# Patient Record
Sex: Female | Born: 1980 | Race: Black or African American | Hispanic: No | Marital: Single | Smoking: Current every day smoker
Health system: Southern US, Community
[De-identification: ages and names within clinical notes are randomized; demographics above are authoritative.]

## PROBLEM LIST (undated history)

## (undated) ENCOUNTER — Inpatient Hospital Stay (HOSPITAL_COMMUNITY): Payer: Self-pay

## (undated) DIAGNOSIS — Z973 Presence of spectacles and contact lenses: Secondary | ICD-10-CM

## (undated) DIAGNOSIS — Z8742 Personal history of other diseases of the female genital tract: Secondary | ICD-10-CM

## (undated) DIAGNOSIS — Z8619 Personal history of other infectious and parasitic diseases: Secondary | ICD-10-CM

## (undated) DIAGNOSIS — N938 Other specified abnormal uterine and vaginal bleeding: Secondary | ICD-10-CM

## (undated) DIAGNOSIS — E0500 Thyrotoxicosis with diffuse goiter without thyrotoxic crisis or storm: Secondary | ICD-10-CM

## (undated) DIAGNOSIS — D259 Leiomyoma of uterus, unspecified: Secondary | ICD-10-CM

## (undated) HISTORY — PX: TONSILLECTOMY AND ADENOIDECTOMY: SUR1326

---
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
## 2001-01-01 ENCOUNTER — Encounter: Payer: Self-pay | Admitting: Family Medicine

## 2001-01-01 ENCOUNTER — Encounter: Admission: RE | Admit: 2001-01-01 | Discharge: 2001-01-01 | Payer: Self-pay | Admitting: Family Medicine

## 2001-09-30 ENCOUNTER — Other Ambulatory Visit: Admission: RE | Admit: 2001-09-30 | Discharge: 2001-09-30 | Payer: Self-pay | Admitting: Family Medicine

## 2004-03-10 HISTORY — PX: WISDOM TOOTH EXTRACTION: SHX21

## 2006-06-05 ENCOUNTER — Ambulatory Visit: Payer: Self-pay | Admitting: Family Medicine

## 2006-06-17 ENCOUNTER — Ambulatory Visit: Payer: Self-pay | Admitting: Family Medicine

## 2006-07-01 ENCOUNTER — Ambulatory Visit: Payer: Self-pay | Admitting: Family Medicine

## 2006-09-01 ENCOUNTER — Ambulatory Visit: Payer: Self-pay | Admitting: Family Medicine

## 2006-09-03 ENCOUNTER — Ambulatory Visit: Payer: Self-pay | Admitting: *Deleted

## 2006-11-10 ENCOUNTER — Ambulatory Visit: Payer: Self-pay | Admitting: Family Medicine

## 2006-11-12 ENCOUNTER — Ambulatory Visit: Payer: Self-pay | Admitting: Internal Medicine

## 2006-12-10 ENCOUNTER — Encounter (INDEPENDENT_AMBULATORY_CARE_PROVIDER_SITE_OTHER): Payer: Self-pay | Admitting: Family Medicine

## 2006-12-10 ENCOUNTER — Ambulatory Visit: Payer: Self-pay | Admitting: Internal Medicine

## 2007-04-08 ENCOUNTER — Ambulatory Visit: Payer: Self-pay | Admitting: Family Medicine

## 2007-04-08 LAB — CONVERTED CEMR LAB
ALT: <8 units/L (ref 0–35)
AST: 11 units/L (ref 0–37)
Albumin: 3.8 g/dL (ref 3.5–5.2)
Alkaline Phosphatase: 68 units/L (ref 39–117)
BUN: 14 mg/dL (ref 6–23)
Basophils Absolute: 0 10*3/uL (ref 0.0–0.1)
Basophils Relative: 0 % (ref 0–1)
CO2: 21 meq/L (ref 19–32)
Calcium: 8.7 mg/dL (ref 8.4–10.5)
Chlamydia, DNA Probe: NEGATIVE
Chloride: 109 meq/L (ref 96–112)
Cholesterol: 134 mg/dL (ref 0–200)
Creatinine, Ser: 0.62 mg/dL (ref 0.40–1.20)
Eosinophils Absolute: 0.1 10*3/uL (ref 0.0–0.7)
Eosinophils Relative: 1 % (ref 0–5)
GC Probe Amp, Genital: NEGATIVE
Glucose, Bld: 94 mg/dL (ref 70–99)
HCT: 39.5 % (ref 36.0–46.0)
HDL: 56 mg/dL (ref >39)
Hemoglobin: 13 g/dL (ref 12.0–15.0)
LDL Cholesterol: 59 mg/dL (ref 0–99)
Lymphocytes Relative: 44 % (ref 12–46)
Lymphs Abs: 4.6 10*3/uL — ABNORMAL HIGH (ref 0.7–4.0)
MCHC: 32.9 g/dL (ref 30.0–36.0)
MCV: 94.7 fL (ref 78.0–100.0)
Monocytes Absolute: 1 10*3/uL (ref 0.1–1.0)
Monocytes Relative: 9 % (ref 3–12)
Neutro Abs: 4.8 10*3/uL (ref 1.7–7.7)
Neutrophils Relative %: 46 % (ref 43–77)
Platelets: 322 10*3/uL (ref 150–400)
Potassium: 3.8 meq/L (ref 3.5–5.3)
RBC: 4.17 M/uL (ref 3.87–5.11)
RDW: 13.5 % (ref 11.5–15.5)
Sodium: 140 meq/L (ref 135–145)
Total Bilirubin: 0.3 mg/dL (ref 0.3–1.2)
Total CHOL/HDL Ratio: 2.4
Total Protein: 6.9 g/dL (ref 6.0–8.3)
Triglycerides: 93 mg/dL (ref <150)
VLDL: 19 mg/dL (ref 0–40)
WBC: 10.5 10*3/uL (ref 4.0–10.5)

## 2007-10-22 ENCOUNTER — Ambulatory Visit: Payer: Self-pay | Admitting: Internal Medicine

## 2008-11-14 ENCOUNTER — Emergency Department (HOSPITAL_COMMUNITY): Admission: EM | Admit: 2008-11-14 | Discharge: 2008-11-14 | Payer: Self-pay | Admitting: Emergency Medicine

## 2009-03-10 HISTORY — PX: LEEP: SHX91

## 2010-03-31 ENCOUNTER — Encounter: Payer: Self-pay | Admitting: Family Medicine

## 2012-09-27 ENCOUNTER — Emergency Department (INDEPENDENT_AMBULATORY_CARE_PROVIDER_SITE_OTHER)
Admission: EM | Admit: 2012-09-27 | Discharge: 2012-09-27 | Disposition: A | Discharge status: Home / Self Care | Payer: Self-pay | Source: Home / Self Care

## 2012-09-27 ENCOUNTER — Encounter (HOSPITAL_COMMUNITY): Payer: Self-pay | Admitting: *Deleted

## 2012-09-27 DIAGNOSIS — J329 Chronic sinusitis, unspecified: Secondary | ICD-10-CM

## 2012-09-27 DIAGNOSIS — G44209 Tension-type headache, unspecified, not intractable: Secondary | ICD-10-CM

## 2012-09-27 MED ORDER — NAPROXEN 500 MG PO TBEC: 500.0000 mg | DELAYED_RELEASE_TABLET | Freq: Two times a day (BID) | ORAL | Status: DC

## 2012-09-27 MED ORDER — TRAMADOL HCL 50 MG PO TABS: 50.0000 mg | ORAL_TABLET | Freq: Four times a day (QID) | ORAL | Status: DC | PRN

## 2012-09-27 NOTE — ED Notes (Signed)
Pt   Has  Symptoms    Of  Headache        Sore  Neck  Lymph  Nodes   Body aches        As   Well  As   sorethroat     -  Symptoms        X    3  Weeks           Pt   Sitting  Upright       On  Exam  Table       In no  Severe  Distress     Speaking  In  Complete     sentances

## 2012-09-27 NOTE — ED Provider Notes (Signed)
History    CSN: [id number] Arrival date & time 09/27/12  1745  First MD Initiated Contact with Patient 09/27/12 1924     Chief Complaint  Patient presents with  . Headache   (Consider location/radiation/quality/duration/timing/severity/associated sxs/prior Treatment) HPI Comments: 32 year old female complaining of headache almost on a daily basis for a month. She states that she has soreness in the back of her head occasionally photophobia and lymph nodes in the back of her neck. She feels sore and achy. She told the nurse that she has a sore throat. She denies fever and current sore throat. She states her sore throat was several days ago but not since. Denies earache, vomiting, abdominal pain, pelvic pain or urinary or vaginal symptoms.  History reviewed. No pertinent past medical history. Past Surgical History  Procedure Laterality Date  . Tonsillectomy     No family history on file. History  Substance Use Topics  . Smoking status: Current Every Day Smoker  . Smokeless tobacco: Not on file  . Alcohol Use: Yes   OB History   Grav Para Term Preterm Abortions TAB SAB Ect Mult Living                 Review of Systems  Constitutional: Negative.   HENT: Positive for neck pain. Negative for ear pain, sore throat, rhinorrhea, neck stiffness and ear discharge.   Eyes: Negative.   Respiratory: Negative for cough, shortness of breath, wheezing and stridor.   Cardiovascular: Negative for chest pain.  Gastrointestinal: Negative.   Genitourinary: Negative.   Skin: Negative.   Neurological: Positive for headaches. Negative for tremors, seizures, syncope, speech difficulty and weakness.    Allergies  Review of patient's allergies indicates no known allergies.  Home Medications   Current Outpatient Rx  Name  Route  Sig  Dispense  Refill  . naproxen (EC-NAPROSYN) 500 MG EC tablet   Oral   Take 1 tablet (500 mg total) by mouth 2 (two) times daily with a meal.   20 tablet   0    . traMADol (ULTRAM) 50 MG tablet   Oral   Take 1 tablet (50 mg total) by mouth every 6 (six) hours as needed for pain.   15 tablet   0    BP 128/82  Pulse 82  Temp(Src) 99.6 F (37.6 C) (Oral)  Resp 18  SpO2 100%  LMP 09/22/2012 Physical Exam  Nursing note and vitals reviewed. Constitutional: She is oriented to person, place, and time. She appears well-developed and well-nourished. No distress.  The patient does not appear ill.  HENT:  Right Ear: External ear normal.  Left Ear: External ear normal.  Completely unable to visualize the oropharynx due to patient's voluntary retraction of the tongue. Unable to see anything and will not be able to access the throat with swabs. She makes no 10 to relax her tongue or follow directions and assisting in depressing the tongue.  Eyes: Conjunctivae and EOM are normal. Pupils are equal, round, and reactive to light.  Neck: Normal range of motion. Neck supple.  Palpation of the posterior, lateral and anterior neck reveals no lymph nodes of any size. When asked to demonstrate  Where the lymph nodes are the patient touches the posterior neck musculature. Am able to palpate the length of the musculature of the  posterior neck muscles that inserts onto the occiput. These are quite tender. When stretching she states that they feel better.   Cardiovascular: Normal rate, regular rhythm and normal  heart sounds.   Pulmonary/Chest: Effort normal and breath sounds normal. No respiratory distress. She has no wheezes. She has no rales.  Musculoskeletal: Normal range of motion. She exhibits no edema.  Lymphadenopathy:    She has no cervical adenopathy.  Neurological: She is alert and oriented to person, place, and time.  Skin: Skin is warm and dry. No rash noted.  Psychiatric: She has a normal mood and affect.    ED Course  Procedures (including critical care time) Labs Reviewed - No data to display No results found. 1. Tension-type headache   2.  Sinusitis nasal     MDM  Naprosyn EC 500 mg twice a day when necessary headache Tramadol 50 mg every 6 hours when necessary pain Recommend Sudafed PE 10 mg every 4 hours when necessary congestion Since her having headaches on a daily basis almost a month he need followup with your PCP soon as possible, he may need to see a neurologist. Am completely unable to visualize the oropharynx or obtain a swab due to the patient's voluntary retraction of the tongue which prevent visual or physical access. There are no palpable cervical lymph nodes.   Hayden Rasmussen, NP 09/27/12 1952

## 2012-09-27 NOTE — ED Provider Notes (Signed)
Medical screening examination/treatment/procedure(s) were performed by resident physician or non-physician practitioner and as supervising physician I was immediately available for consultation/collaboration.   KINDL,JAMES DOUGLAS MD.   James D Kindl, MD 09/27/12 2027 

## 2012-12-08 ENCOUNTER — Inpatient Hospital Stay (HOSPITAL_COMMUNITY)
Admission: AD | Admit: 2012-12-08 | Discharge: 2012-12-08 | Disposition: A | Discharge status: Home / Self Care | Payer: Self-pay | Source: Ambulatory Visit | Attending: Family Medicine | Admitting: Family Medicine

## 2012-12-08 ENCOUNTER — Encounter (HOSPITAL_COMMUNITY): Payer: Self-pay

## 2012-12-08 DIAGNOSIS — N938 Other specified abnormal uterine and vaginal bleeding: Secondary | ICD-10-CM | POA: Insufficient documentation

## 2012-12-08 DIAGNOSIS — N946 Dysmenorrhea, unspecified: Secondary | ICD-10-CM | POA: Insufficient documentation

## 2012-12-08 DIAGNOSIS — N949 Unspecified condition associated with female genital organs and menstrual cycle: Secondary | ICD-10-CM | POA: Insufficient documentation

## 2012-12-08 LAB — URINALYSIS, ROUTINE W REFLEX MICROSCOPIC
Glucose, UA: NEGATIVE mg/dL
Leukocytes, UA: NEGATIVE
Nitrite: NEGATIVE
Protein, ur: 100 mg/dL — AB
pH: 6.5 (ref 5.0–8.0)

## 2012-12-08 LAB — CBC
HCT: 41.4 % (ref 36.0–46.0)
MCH: 30 pg (ref 26.0–34.0)
MCHC: 33.3 g/dL (ref 30.0–36.0)
Platelets: 399 10*3/uL (ref 150–400)
RDW: 13.7 % (ref 11.5–15.5)

## 2012-12-08 LAB — URINE MICROSCOPIC-ADD ON

## 2012-12-08 LAB — POCT PREGNANCY, URINE: Preg Test, Ur: NEGATIVE

## 2012-12-08 MED ORDER — TRAMADOL HCL 50 MG PO TABS: 50.0000 mg | ORAL_TABLET | Freq: Four times a day (QID) | ORAL | Status: DC | PRN

## 2012-12-08 NOTE — MAU Note (Signed)
Pt reports she started new birth control pills 8 months ago, has been spotting for up to a week every month before her period. Having sharp cramping with periods. LMP 11/24/2012, has changed her pad x 2 today.

## 2012-12-08 NOTE — MAU Provider Note (Signed)
History     CSN: [id number]  Arrival date and time: 12/08/12 1850   First Provider Initiated Contact with Patient 12/08/12 2058      Chief Complaint  Patient presents with  . Vaginal Bleeding  . Dysmenorrhea   HPI This is a 32 y.o. female who presents with c/o bleeding between periods, requiring a pad. Had been on OrthoTricyclen since teen years for severe dysmenorrhea.  Was changed by [facility] 8 mos ago to Alesse pills (possibly due to migraines and smoking).  Began having BTB at that time. Was told by [facility] that this was normal with first 6 mos of pills. Has not been back to them to discuss this.   Is worried something "is very wrong" because of this bleeding and cramping.  Not sexually active now, declines STD testing.  May want to conceive in the next few years "before I am 36".    Has a family history of fibroids. Worried she has these.   OB History   Grav Para Term Preterm Abortions TAB SAB Ect Mult Living   0 0 0 0 0 0 0 0 0 0       Past Medical History  Diagnosis Date  . Diabetes mellitus without complication     borderline diabetic  . Abnormal Pap smear 2011  . HPV in female     Past Surgical History  Procedure Laterality Date  . Tonsillectomy    . Leep  2011    Family History  Problem Relation Age of Onset  . Fibroids Mother     History  Substance Use Topics  . Smoking status: Current Every Day Smoker -- 0.25 packs/day for 10 years    Types: Cigarettes  . Smokeless tobacco: Not on file  . Alcohol Use: Yes    Allergies: No Known Allergies  Prescriptions prior to admission  Medication Sig Dispense Refill  . aspirin-acetaminophen-caffeine (EXCEDRIN MIGRAINE) 250-250-65 MG per tablet Take 2 tablets by mouth every 6 (six) hours as needed for pain (For headache.).      Marland Kitchen cetirizine (ZYRTEC) 10 MG tablet Take 10 mg by mouth daily.      . fish oil-omega-3 fatty acids 1000 MG capsule Take 1 g by mouth daily.      Marland Kitchen levonorgestrel-ethinyl  estradiol (AVIANE,ALESSE,LESSINA) 0.1-20 MG-MCG tablet Take 1 tablet by mouth daily.      . Multiple Vitamin (MULTIVITAMIN WITH MINERALS) TABS tablet Take 1 tablet by mouth daily.      Marland Kitchen SLO-NIACIN PO Take 1 tablet by mouth 2 (two) times a week. Takes one capsule on Wednesday and Sunday.        Review of Systems  Constitutional: Negative for fever and malaise/fatigue.  Gastrointestinal: Positive for abdominal pain (with menses, refractory to Motrin 800mg ). Negative for nausea, vomiting, diarrhea and constipation.  Genitourinary:       Bleeding between periods   Neurological: Positive for weakness. Negative for headaches.   Physical Exam   Blood pressure 133/83, pulse 67, temperature 98.3 F (36.8 C), temperature source Oral, resp. rate 18, height 5\' 3"  (1.6 m), weight 98.431 kg (217 lb), last menstrual period 11/24/2012, SpO2 100.00%.  Physical Exam  Constitutional: She is oriented to person, place, and time. She appears well-developed and well-nourished. No distress.  HENT:  Head: Normocephalic.  Cardiovascular: Normal rate.   Respiratory: Effort normal.  GI: Soft. She exhibits no distension. There is no tenderness. There is no rebound and no guarding.  Genitourinary: Uterus normal. Vaginal discharge (dark  red blood) found.  Musculoskeletal: Normal range of motion.  Neurological: She is alert and oriented to person, place, and time.  Skin: Skin is warm and dry.  Psychiatric:  Very anxious   Uterus difficult to feel due to habitus, does not feel very large Adnexae nontender   MAU Course  Procedures  MDM Extensive discussion of BTB with very low dose pills Extensive counseling on smoking cessation May need to rule out endometriosis or fibroids Will check CBC  Assessment and Plan  A:  Dysfunctional uterine bleeding, most likely related to very low dose pills      Dysmenorrhea, severe        P:  Discussed diagnostic options      Does not have insurance, so would like  to do some testing outside ED      Will refer to clinic for management      Continue ibuprofen prn for pain, will give limited Rx Tramadol      Will schedule outpatient [place] to r/o fibroids      Will have MD assess likelihood of endometriosis as source of severe dysmenorrhea      [facility] 12/08/2012, 9:31 PM

## 2012-12-08 NOTE — MAU Provider Note (Signed)
Chart reviewed and agree with management and plan.  

## 2012-12-10 LAB — URINE CULTURE
Colony Count: NO GROWTH
Culture: NO GROWTH

## 2012-12-15 ENCOUNTER — Telehealth: Payer: Self-pay | Admitting: *Deleted

## 2012-12-15 DIAGNOSIS — D219 Benign neoplasm of connective and other soft tissue, unspecified: Secondary | ICD-10-CM

## 2012-12-15 DIAGNOSIS — N946 Dysmenorrhea, unspecified: Secondary | ICD-10-CM

## 2012-12-15 NOTE — Telephone Encounter (Signed)
Message copied by Gerome Apley on Wed Dec 15, 2012  3:15 PM ------      Message from: [place], [place] L      Created: Wed Dec 08, 2012  9:28 PM      Regarding: GYN referral       Seen for breakthrough bleeding on very low dose OCPs. Also has increased pain with periods (r/o fibroids or endometriosis)            Admin:   Needs GYN appt with MD to discuss possible endometriosis (whether they would recommend laparoscopy for diagnosis) and rsults of [place]            Clinical:      Needs Outpatient US pelvic complete (I will put in order for radiology)      Not sure if Radiology will order or is that just for OB ultrasounds             ------

## 2012-12-15 NOTE — Telephone Encounter (Signed)
Has gyn follow up scheduled, called and scheduled ultrasounds. Called patient and left message with a female to have patient call clinic re: appointments we have made.

## 2012-12-20 ENCOUNTER — Ambulatory Visit (HOSPITAL_COMMUNITY)
Admission: RE | Admit: 2012-12-20 | Discharge: 2012-12-20 | Disposition: A | Discharge status: Home / Self Care | Payer: Self-pay | Source: Ambulatory Visit | Attending: Advanced Practice Midwife | Admitting: Advanced Practice Midwife

## 2012-12-20 DIAGNOSIS — N946 Dysmenorrhea, unspecified: Secondary | ICD-10-CM | POA: Insufficient documentation

## 2012-12-20 DIAGNOSIS — D250 Submucous leiomyoma of uterus: Secondary | ICD-10-CM | POA: Insufficient documentation

## 2012-12-20 DIAGNOSIS — N938 Other specified abnormal uterine and vaginal bleeding: Secondary | ICD-10-CM

## 2012-12-20 DIAGNOSIS — D219 Benign neoplasm of connective and other soft tissue, unspecified: Secondary | ICD-10-CM

## 2012-12-22 NOTE — Telephone Encounter (Signed)
Called Krista Hogan and left message we are calling about some appointments we have made for  You- left information for gyn follow up appt.  Please call clinics to discuss what this appointment is for. Do see per chart she did come to ultrasound appointment.

## 2012-12-23 ENCOUNTER — Telehealth: Payer: Self-pay | Admitting: *Deleted

## 2012-12-23 NOTE — Telephone Encounter (Signed)
Krista Hogan called and left a message stating she wants results of ultrasound

## 2012-12-27 NOTE — Telephone Encounter (Signed)
Called patient and her mother answered left message that we are returning her call. Mother states that she will let her know to call us.

## 2012-12-31 NOTE — Telephone Encounter (Signed)
Called patient back; no answer. Left message on VM to call us back and for her to state in the nurse line that she's okay for [place] to leave result message on her vm box.

## 2013-01-03 NOTE — Telephone Encounter (Signed)
Patient left voice message on Friday stating that we can leave a voicemail that is detailed.

## 2013-01-05 NOTE — Telephone Encounter (Signed)
Called pt and informed her of [place] result showing small fibroid. I advised her to keep appt as scheduled on 11/13 to discuss her sx and develop plan of care with MD. Pt agreed and voiced understanding

## 2013-01-20 ENCOUNTER — Ambulatory Visit (INDEPENDENT_AMBULATORY_CARE_PROVIDER_SITE_OTHER): Payer: Self-pay | Admitting: Obstetrics & Gynecology

## 2013-01-20 ENCOUNTER — Encounter: Payer: Self-pay | Admitting: Obstetrics & Gynecology

## 2013-01-20 VITALS — BP 112/78 | HR 85 | Temp 98.1°F | Ht 63.0 in | Wt 215.2 lb

## 2013-01-20 DIAGNOSIS — N946 Dysmenorrhea, unspecified: Secondary | ICD-10-CM

## 2013-01-20 DIAGNOSIS — N926 Irregular menstruation, unspecified: Secondary | ICD-10-CM

## 2013-01-20 MED ORDER — NORGESTIMATE-ETH ESTRADIOL 0.25-35 MG-MCG PO TABS: 1.0000 | ORAL_TABLET | Freq: Every day | ORAL | Status: DC

## 2013-01-20 NOTE — Progress Notes (Signed)
Subjective:     Patient ID: Krista Hogan, female   DOB: Jul 26, 1980, 32 y.o.   MRN: [id number]  HPI Pt was told that she had uterine fibroids and was worried that she would never be able to bear children.  She was without sx until she was started on low dose OCP's and she started to have some spotting.  She was annoyed by the bleeding and thought it was all related to the OCP's until she got the results of the sono.  She dnies pain or other sx. She wants to change the OCP.      Review of Systems     Objective:   Physical Exam BP 112/78  Pulse 85  Temp(Src) 98.1 F (36.7 C)  Ht 5\' 3"  (1.6 m)  Wt 215 lb 3.2 oz (97.614 kg)  BMI 38.13 kg/m2  LMP 01/12/2013 PT in NAD Exam deferred  12/20/2012 TECHNIQUE:  Both transabdominal and transvaginal ultrasound examinations of the  pelvis were performed. Transabdominal technique was performed for  global imaging of the pelvis including uterus, ovaries, adnexal  regions, and pelvic cul-de-sac. It was necessary to proceed with  endovaginal exam following the transabdominal exam to visualize the  uterus and ovaries.  COMPARISON: Pelvic ultrasound 03/23/2006.  FINDINGS:  Uterus  The uterus is anteverted. It measures 7.1 x 3.5 x 3.9 cm. There is a  2.2 x 2.0 x 2.0 cm submucosal fibroid within the uterine fundus.  Endometrium  Thickness: Unremarkable measuring 3 mm No focal abnormality  visualized.  Right ovary  Measurements: Measures 2.8 x 2.4 x 2.4 cm. Normal appearance/no  adnexal mass.  Left ovary  Measurements: Measures 2.1 x 2.1 x 1.5 cm. Normal appearance/no  adnexal mass.  Other findings  Trace free fluid.  IMPRESSION:  Submucosal fibroid within the uterine fundus.       Assessment:      Asymptomatic uterine fibroid Break through bleeding on low dose OCP's     Plan:     Change OCP's to Sprintec F/u prn Pt educated on Uterine fibroids

## 2013-01-20 NOTE — Progress Notes (Signed)
Pt does not want an exam today, desires to consult only.

## 2013-01-20 NOTE — Patient Instructions (Signed)
Levonorgestrel intrauterine device (IUD) What is this medicine? LEVONORGESTREL IUD (LEE voe nor jes trel) is a contraceptive (birth control) device. The device is placed inside the uterus by a healthcare professional. It is used to prevent pregnancy and can also be used to treat heavy bleeding that occurs during your period. Depending on the device, it can be used for 3 to 5 years. This medicine may be used for other purposes; ask your health care provider or pharmacist if you have questions. COMMON BRAND NAME(S): Mirena, Skyla What should I tell my health care provider before I take this medicine? They need to know if you have any of these conditions: -abnormal Pap smear -cancer of the breast, uterus, or cervix -diabetes -endometritis -genital or pelvic infection now or in the past -have more than one sexual partner or your partner has more than one partner -heart disease -history of an ectopic or tubal pregnancy -immune system problems -IUD in place -liver disease or tumor -problems with blood clots or take blood-thinners -use intravenous drugs -uterus of unusual shape -vaginal bleeding that has not been explained -an unusual or allergic reaction to levonorgestrel, other hormones, silicone, or polyethylene, medicines, foods, dyes, or preservatives -pregnant or trying to get pregnant -breast-feeding How should I use this medicine? This device is placed inside the uterus by a health care professional. Talk to your pediatrician regarding the use of this medicine in children. Special care may be needed. Overdosage: If you think you have taken too much of this medicine contact a poison control center or emergency room at once. NOTE: This medicine is only for you. Do not share this medicine with others. What if I miss a dose? This does not apply. What may interact with this medicine? Do not take this medicine with any of the following  medications: -amprenavir -bosentan -fosamprenavir This medicine may also interact with the following medications: -aprepitant -barbiturate medicines for inducing sleep or treating seizures -bexarotene -griseofulvin -medicines to treat seizures like carbamazepine, ethotoin, felbamate, oxcarbazepine, phenytoin, topiramate -modafinil -pioglitazone -rifabutin -rifampin -rifapentine -some medicines to treat HIV infection like atazanavir, indinavir, lopinavir, nelfinavir, tipranavir, ritonavir -St. John's wort -warfarin This list may not describe all possible interactions. Give your health care provider a list of all the medicines, herbs, non-prescription drugs, or dietary supplements you use. Also tell them if you smoke, drink alcohol, or use illegal drugs. Some items may interact with your medicine. What should I watch for while using this medicine? Visit your doctor or health care professional for regular check ups. See your doctor if you or your partner has sexual contact with others, becomes HIV positive, or gets a sexual transmitted disease. This product does not protect you against HIV infection (AIDS) or other sexually transmitted diseases. You can check the placement of the IUD yourself by reaching up to the top of your vagina with clean fingers to feel the threads. Do not pull on the threads. It is a good habit to check placement after each menstrual period. Call your doctor right away if you feel more of the IUD than just the threads or if you cannot feel the threads at all. The IUD may come out by itself. You may become pregnant if the device comes out. If you notice that the IUD has come out use a backup birth control method like condoms and call your health care provider. Using tampons will not change the position of the IUD and are okay to use during your period. What side effects may I   notice from receiving this medicine? Side effects that you should report to your doctor or  health care professional as soon as possible: -allergic reactions like skin rash, itching or hives, swelling of the face, lips, or tongue -fever, flu-like symptoms -genital sores -high blood pressure -no menstrual period for 6 weeks during use -pain, swelling, warmth in the leg -pelvic pain or tenderness -severe or sudden headache -signs of pregnancy -stomach cramping -sudden shortness of breath -trouble with balance, talking, or walking -unusual vaginal bleeding, discharge -yellowing of the eyes or skin Side effects that usually do not require medical attention (report to your doctor or health care professional if they continue or are bothersome): -acne -breast pain -change in sex drive or performance -changes in weight -cramping, dizziness, or faintness while the device is being inserted -headache -irregular menstrual bleeding within first 3 to 6 months of use -nausea This list may not describe all possible side effects. Call your doctor for medical advice about side effects. You may report side effects to FDA at 1-800-FDA-1088. Where should I keep my medicine? This does not apply. NOTE: This sheet is a summary. It may not cover all possible information. If you have questions about this medicine, talk to your doctor, pharmacist, or health care provider.  2014, [employer] Standard. (2011-03-27 13:54:04)  

## 2013-12-14 ENCOUNTER — Ambulatory Visit: Payer: Self-pay | Admitting: Obstetrics

## 2013-12-15 ENCOUNTER — Ambulatory Visit (INDEPENDENT_AMBULATORY_CARE_PROVIDER_SITE_OTHER): Payer: Managed Care, Other (non HMO) | Admitting: Obstetrics

## 2013-12-15 ENCOUNTER — Encounter: Payer: Self-pay | Admitting: Obstetrics

## 2013-12-15 ENCOUNTER — Ambulatory Visit: Payer: Self-pay | Admitting: Obstetrics

## 2013-12-15 VITALS — BP 109/75 | HR 76 | Temp 98.4°F | Ht 63.0 in | Wt 213.0 lb

## 2013-12-15 DIAGNOSIS — D250 Submucous leiomyoma of uterus: Secondary | ICD-10-CM

## 2013-12-15 DIAGNOSIS — R3915 Urgency of urination: Secondary | ICD-10-CM

## 2013-12-15 DIAGNOSIS — N926 Irregular menstruation, unspecified: Secondary | ICD-10-CM | POA: Insufficient documentation

## 2013-12-15 DIAGNOSIS — Z01419 Encounter for gynecological examination (general) (routine) without abnormal findings: Secondary | ICD-10-CM

## 2013-12-15 DIAGNOSIS — N939 Abnormal uterine and vaginal bleeding, unspecified: Secondary | ICD-10-CM | POA: Insufficient documentation

## 2013-12-15 DIAGNOSIS — N946 Dysmenorrhea, unspecified: Secondary | ICD-10-CM | POA: Insufficient documentation

## 2013-12-15 DIAGNOSIS — Z0000 Encounter for general adult medical examination without abnormal findings: Secondary | ICD-10-CM

## 2013-12-15 HISTORY — DX: Submucous leiomyoma of uterus: D25.0

## 2013-12-15 LAB — POCT URINALYSIS DIPSTICK
Bilirubin, UA: NEGATIVE
Glucose, UA: NEGATIVE
KETONES UA: NEGATIVE
Leukocytes, UA: NEGATIVE
Nitrite, UA: NEGATIVE
PH UA: 6
SPEC GRAV UA: 1.02
Urobilinogen, UA: NEGATIVE

## 2013-12-15 MED ORDER — TRAMADOL HCL 50 MG PO TABS: 50.0000 mg | ORAL_TABLET | Freq: Four times a day (QID) | ORAL | Status: DC | PRN

## 2013-12-15 MED ORDER — NORGESTIMATE-ETH ESTRADIOL 0.25-35 MG-MCG PO TABS: 1.0000 | ORAL_TABLET | Freq: Every day | ORAL | Status: DC

## 2013-12-15 MED ORDER — IBUPROFEN 800 MG PO TABS: 800.0000 mg | ORAL_TABLET | Freq: Three times a day (TID) | ORAL | Status: DC | PRN

## 2013-12-15 NOTE — Progress Notes (Signed)
Subjective:     Krista Hogan is a 33 y.o. female here for a routine exam.  Current complaints: Uterine fibroid with painful and heavy periods.  Urinary urgency.  Recurrent yeast infections that has responded well to Boric acid suppositories after failures with Diflucan and creams.  Personal health questionnaire:  Is patient Ashkenazi Jewish, have a family history of breast and/or ovarian cancer: no Is there a family history of uterine cancer diagnosed at age < 70, gastrointestinal cancer, urinary tract cancer, family member who is a Field seismologist syndrome-associated carrier: no Is the patient overweight and hypertensive, family history of diabetes, personal history of gestational diabetes or PCOS: no Is patient over 41, have PCOS,  family history of premature CHD under age 2, diabetes, smoke, have hypertension or peripheral artery disease:  no At any time, has a partner hit, kicked or otherwise hurt or frightened you?: no Over the past 2 weeks, have you felt down, depressed or hopeless?: no Over the past 2 weeks, have you felt little interest or pleasure in doing things?:no   Gynecologic History Patient's last menstrual period was 11/23/2013. Contraception: OCP (estrogen/progesterone) Last Pap: 2011. Results were: normal Last mammogram: n/a. Results were: normal  Obstetric History OB History  Gravida Para Term Preterm AB SAB TAB Ectopic Multiple Living  0 0 0 0 0 0 0 0 0 0         Past Medical History  Diagnosis Date  . Diabetes mellitus without complication     borderline diabetic  . Abnormal Pap smear 2011  . HPV in female     Past Surgical History  Procedure Laterality Date  . Tonsillectomy    . Leep  2011    Current outpatient prescriptions:aspirin-acetaminophen-caffeine (EXCEDRIN MIGRAINE) 967-893-81 MG per tablet, Take 2 tablets by mouth every 6 (six) hours as needed for pain (For headache.)., Disp: , Rfl: ;  cetirizine (ZYRTEC) 10 MG tablet, Take 10 mg by mouth daily., Disp:  , Rfl: ;  fish oil-omega-3 fatty acids 1000 MG capsule, Take 1 g by mouth daily., Disp: , Rfl:  Multiple Vitamin (MULTIVITAMIN WITH MINERALS) TABS tablet, Take 1 tablet by mouth daily., Disp: , Rfl: ;  norgestimate-ethinyl estradiol (ORTHO-CYCLEN,SPRINTEC,PREVIFEM) 0.25-35 MG-MCG tablet, Take 1 tablet by mouth daily., Disp: 1 Package, Rfl: 11;  SLO-NIACIN PO, Take 1 tablet by mouth 2 (two) times a week. Takes one capsule on Wednesday and Sunday., Disp: , Rfl:  traMADol (ULTRAM) 50 MG tablet, Take 1 tablet (50 mg total) by mouth every 6 (six) hours as needed for moderate pain., Disp: 40 tablet, Rfl: 4;  ibuprofen (ADVIL,MOTRIN) 800 MG tablet, Take 1 tablet (800 mg total) by mouth every 8 (eight) hours as needed., Disp: 30 tablet, Rfl: 5 No Known Allergies  History  Substance Use Topics  . Smoking status: Current Some Day Smoker -- 0.25 packs/day for 10 years    Types: Cigarettes  . Smokeless tobacco: Never Used  . Alcohol Use: Yes     Comment: rarely     Family History  Problem Relation Age of Onset  . Fibroids Mother       Review of Systems  Constitutional: negative for fatigue and weight loss Respiratory: negative for cough and wheezing Cardiovascular: negative for chest pain, fatigue and palpitations Gastrointestinal: negative for abdominal pain and change in bowel habits Musculoskeletal:negative for myalgias Neurological: negative for gait problems and tremors Behavioral/Psych: negative for abusive relationship, depression Endocrine: negative for temperature intolerance   Genitourinary:negative for abnormal menstrual periods, genital lesions, hot  flashes, sexual problems and vaginal discharge Integument/breast: negative for breast lump, breast tenderness, nipple discharge and skin lesion(s)    Objective:       BP 109/75  Pulse 76  Temp(Src) 98.4 F (36.9 C)  Ht 5\' 3"  (1.6 m)  Wt 213 lb (96.616 kg)  BMI 37.74 kg/m2  LMP 11/23/2013 General:   alert  Skin:   no rash or  abnormalities  Lungs:   clear to auscultation bilaterally  Heart:   regular rate and rhythm, S1, S2 normal, no murmur, click, rub or gallop  Breasts:   normal without suspicious masses, skin or nipple changes or axillary nodes  Abdomen:  normal findings: no organomegaly, soft, non-tender and no hernia  Pelvis:  External genitalia: normal general appearance Urinary system: urethral meatus normal and bladder without fullness, nontender Vaginal: normal without tenderness, induration or masses Cervix: normal appearance Adnexa: normal bimanual exam Uterus: anteverted and non-tender, normal size   Lab Review Urine pregnancy test Labs reviewed yes Radiologic studies reviewed yes   Assessment:    Healthy female exam.   Submucous uterine fibroid.  AUB and Dysmenorrhea.  Urinary urgency  Recurrent yeast infections  Contraceptive pill surveillance   Plan:   Hysteroscopy and resection of submucous polyp recommended. Ibuprofen and Tramadol Rx Urine Cx ordered Boric acid suppositories Rx Sprintec 28 Rx   Education reviewed: low fat, low cholesterol diet, safe sex/STD prevention, self breast exams, weight bearing exercise and management of submucous fibroids. Contraception: OCP (estrogen/progesterone). Follow up in: 2 months.   Meds ordered this encounter  Medications  . norgestimate-ethinyl estradiol (ORTHO-CYCLEN,SPRINTEC,PREVIFEM) 0.25-35 MG-MCG tablet    Sig: Take 1 tablet by mouth daily.    Dispense:  1 Package    Refill:  11  . traMADol (ULTRAM) 50 MG tablet    Sig: Take 1 tablet (50 mg total) by mouth every 6 (six) hours as needed for moderate pain.    Dispense:  40 tablet    Refill:  4  . ibuprofen (ADVIL,MOTRIN) 800 MG tablet    Sig: Take 1 tablet (800 mg total) by mouth every 8 (eight) hours as needed.    Dispense:  30 tablet    Refill:  5   Orders Placed This Encounter  Procedures  . WET PREP BY MOLECULAR PROBE  . GC/Chlamydia Probe Amp  . Urine Culture

## 2013-12-16 LAB — PAP IG AND HPV HIGH-RISK: HPV DNA High Risk: NOT DETECTED

## 2013-12-16 LAB — WET PREP BY MOLECULAR PROBE
CANDIDA SPECIES: NEGATIVE
Gardnerella vaginalis: NEGATIVE
TRICHOMONAS VAG: NEGATIVE

## 2013-12-16 NOTE — Addendum Note (Signed)
Addended by: Ladona Ridgel on: 12/16/2013 04:42 PM   Modules accepted: Orders

## 2013-12-17 LAB — URINE CULTURE
Colony Count: NO GROWTH
Organism ID, Bacteria: NO GROWTH

## 2014-06-14 ENCOUNTER — Encounter: Payer: Self-pay | Admitting: Obstetrics

## 2014-06-14 ENCOUNTER — Ambulatory Visit (INDEPENDENT_AMBULATORY_CARE_PROVIDER_SITE_OTHER): Payer: Managed Care, Other (non HMO) | Admitting: Obstetrics

## 2014-06-14 VITALS — BP 123/89 | HR 77 | Temp 98.2°F | Wt 223.0 lb

## 2014-06-14 DIAGNOSIS — D250 Submucous leiomyoma of uterus: Secondary | ICD-10-CM | POA: Diagnosis not present

## 2014-06-14 DIAGNOSIS — N946 Dysmenorrhea, unspecified: Secondary | ICD-10-CM | POA: Diagnosis not present

## 2014-06-14 DIAGNOSIS — N939 Abnormal uterine and vaginal bleeding, unspecified: Secondary | ICD-10-CM | POA: Diagnosis not present

## 2014-06-14 DIAGNOSIS — R399 Unspecified symptoms and signs involving the genitourinary system: Secondary | ICD-10-CM | POA: Diagnosis not present

## 2014-06-14 LAB — POCT URINALYSIS DIPSTICK
Bilirubin, UA: NEGATIVE
GLUCOSE UA: NEGATIVE
Ketones, UA: NEGATIVE
LEUKOCYTES UA: NEGATIVE
NITRITE UA: NEGATIVE
PH UA: 5.5
PROTEIN UA: NEGATIVE
RBC UA: 250
Spec Grav, UA: 1.015
Urobilinogen, UA: NEGATIVE

## 2014-06-14 NOTE — Patient Instructions (Signed)
Hysteroscopy °Hysteroscopy is a procedure used for looking inside the womb (uterus). It may be done for various reasons, including: °· To evaluate abnormal bleeding, fibroid (benign, noncancerous) tumors, polyps, scar tissue (adhesions), and possibly cancer of the uterus. °· To look for lumps (tumors) and other uterine growths. °· To look for causes of why a woman cannot get pregnant (infertility), causes of recurrent loss of pregnancy (miscarriages), or a lost intrauterine device (IUD). °· To perform a sterilization by blocking the fallopian tubes from inside the uterus. °In this procedure, a thin, flexible tube with a tiny light and camera on the end of it (hysteroscope) is used to look inside the uterus. A hysteroscopy should be done right after a menstrual period to be sure you are not pregnant. °LET YOUR HEALTH CARE PROVIDER KNOW ABOUT:  °· Any allergies you have. °· All medicines you are taking, including vitamins, herbs, eye drops, creams, and over-the-counter medicines. °· Previous problems you or members of your family have had with the use of anesthetics. °· Any blood disorders you have. °· Previous surgeries you have had. °· Medical conditions you have. °RISKS AND COMPLICATIONS  °Generally, this is a safe procedure. However, as with any procedure, complications can occur. Possible complications include: °· Putting a hole in the uterus. °· Excessive bleeding. °· Infection. °· Damage to the cervix. °· Injury to other organs. °· Allergic reaction to medicines. °· Too much fluid used in the uterus for the procedure. °BEFORE THE PROCEDURE  °· Ask your health care provider about changing or stopping any regular medicines. °· Do not take aspirin or blood thinners for 1 week before the procedure, or as directed by your health care provider. These can cause bleeding. °· If you smoke, do not smoke for 2 weeks before the procedure. °· In some cases, a medicine is placed in the cervix the day before the procedure.  This medicine makes the cervix have a larger opening (dilate). This makes it easier for the instrument to be inserted into the uterus during the procedure. °· Do not eat or drink anything for at least 8 hours before the surgery. °· Arrange for someone to take you home after the procedure. °PROCEDURE  °· You may be given a medicine to relax you (sedative). You may also be given one of the following: °¨ A medicine that numbs the area around the cervix (local anesthetic). °¨ A medicine that makes you sleep through the procedure (general anesthetic). °· The hysteroscope is inserted through the vagina into the uterus. The camera on the hysteroscope sends a picture to a TV screen. This gives the surgeon a good view inside the uterus. °· During the procedure, air or a liquid is put into the uterus, which allows the surgeon to see better. °· Sometimes, tissue is gently scraped from inside the uterus. These tissue samples are sent to a lab for testing. °AFTER THE PROCEDURE  °· If you had a general anesthetic, you may be groggy for a couple hours after the procedure. °· If you had a local anesthetic, you will be able to go home as soon as you are stable and feel ready. °· You may have some cramping. This normally lasts for a couple days. °· You may have bleeding, which varies from light spotting for a few days to menstrual-like bleeding for 3-7 days. This is normal. °· If your test results are not back during the visit, make an appointment with your health care provider to find out the   results. Document Released: 06/02/2000 Document Revised: 12/15/2012 Document Reviewed: 09/23/2012 [facility] Patient Information 2015 [place], [place]. This information is not intended to replace advice given to you by your health care provider. Make sure you discuss any questions you have with your health care provider. Fibroids Fibroids are lumps (tumors) that can occur any place in a woman's body. These lumps are not cancerous. Fibroids  vary in size, weight, and where they grow. HOME CARE  Do not take aspirin.  Write down the number of pads or tampons you use during your period. Tell your doctor. This can help determine the best treatment for you. GET HELP RIGHT AWAY IF:  You have pain in your lower belly (abdomen) that is not helped with medicine.  You have cramps that are not helped with medicine.  You have more bleeding between or during your period.  You feel lightheaded or pass out (faint).  Your lower belly pain gets worse. MAKE SURE YOU:  Understand these instructions.  Will watch your condition.  Will get help right away if you are not doing well or get worse. Document Released: 03/29/2010 Document Revised: 05/19/2011 Document Reviewed: 03/29/2010 [facility] Patient Information 2015 [place], [place]. This information is not intended to replace advice given to you by your health care provider. Make sure you discuss any questions you have with your health care provider.

## 2014-06-14 NOTE — Progress Notes (Signed)
Patient ID: Krista Hogan, female   DOB: 29-Nov-1980, 34 y.o.   MRN: [id number]  Chief Complaint  Patient presents with  . Menstrual Problem    heavy cycle, has fibroid    HPI Krista Hogan is a 34 y.o. female.  Heavy, painful periods.  H/O submucous fibroid.  Taking OCP's. HPI  Past Medical History  Diagnosis Date  . Abnormal Pap smear 2011  . HPV in female     Past Surgical History  Procedure Laterality Date  . Tonsillectomy    . Leep  2011    Family History  Problem Relation Age of Onset  . Fibroids Mother     Social History History  Substance Use Topics  . Smoking status: Current Some Day Smoker -- 0.25 packs/day for 10 years    Types: Cigarettes  . Smokeless tobacco: Never Used  . Alcohol Use: 0.0 oz/week    0 Standard drinks or equivalent per week     Comment: rarely     No Known Allergies  Current Outpatient Prescriptions  Medication Sig Dispense Refill  . cetirizine (ZYRTEC) 10 MG tablet Take 10 mg by mouth daily.    . cholecalciferol (VITAMIN D) 1000 UNITS tablet Take 1,000 Units by mouth daily.    . norgestimate-ethinyl estradiol (ORTHO-CYCLEN,SPRINTEC,PREVIFEM) 0.25-35 MG-MCG tablet Take 1 tablet by mouth daily. 1 Package 11  . aspirin-acetaminophen-caffeine (EXCEDRIN MIGRAINE) 063-016-01 MG per tablet Take 2 tablets by mouth every 6 (six) hours as needed for pain (For headache.).    Marland Kitchen fish oil-omega-3 fatty acids 1000 MG capsule Take 1 g by mouth daily.    Marland Kitchen ibuprofen (ADVIL,MOTRIN) 800 MG tablet Take 1 tablet (800 mg total) by mouth every 8 (eight) hours as needed. (Patient not taking: Reported on 06/14/2014) 30 tablet 5  . Multiple Vitamin (MULTIVITAMIN WITH MINERALS) TABS tablet Take 1 tablet by mouth daily.    Marland Kitchen SLO-NIACIN PO Take 1 tablet by mouth 2 (two) times a week. Takes one capsule on Wednesday and Sunday.    . traMADol (ULTRAM) 50 MG tablet Take 1 tablet (50 mg total) by mouth every 6 (six) hours as needed for moderate pain. (Patient not  taking: Reported on 06/14/2014) 40 tablet 4   No current facility-administered medications for this visit.    Review of Systems Review of Systems Constitutional: negative for fatigue and weight loss Respiratory: negative for cough and wheezing Cardiovascular: negative for chest pain, fatigue and palpitations Gastrointestinal: negative for abdominal pain and change in bowel habits Genitourinary: Heavy and painful periods Integument/breast: negative for nipple discharge Musculoskeletal:negative for myalgias Neurological: negative for gait problems and tremors Behavioral/Psych: negative for abusive relationship, depression Endocrine: negative for temperature intolerance     Blood pressure 123/89, pulse 77, temperature 98.2 F (36.8 C), weight 223 lb (101.152 kg), last menstrual period 05/28/2014.  Physical Exam Physical Exam General:   alert  Skin:   no rash or abnormalities  Lungs:   clear to auscultation bilaterally  Heart:   regular rate and rhythm, S1, S2 normal, no murmur, click, rub or gallop  Breasts:   normal without suspicious masses, skin or nipple changes or axillary nodes  Abdomen:  normal findings: no organomegaly, soft, non-tender and no hernia  Pelvis:  External genitalia: normal general appearance Urinary system: urethral meatus normal and bladder without fullness, nontender Vaginal: normal without tenderness, induration or masses Cervix: normal appearance Adnexa: normal bimanual exam Uterus: anteverted and non-tender, normal size    Status: Final result  Study Result     CLINICAL DATA: Dysmenorrhea.  EXAM: TRANSABDOMINAL AND TRANSVAGINAL ULTRASOUND OF PELVIS  TECHNIQUE: Both transabdominal and transvaginal ultrasound examinations of the pelvis were performed. Transabdominal technique was performed for global imaging of the pelvis including uterus, ovaries, adnexal regions, and pelvic cul-de-sac. It was necessary to proceed  with endovaginal exam following the transabdominal exam to visualize the uterus and ovaries.  COMPARISON: Pelvic ultrasound 03/23/2006.  FINDINGS: Uterus  The uterus is anteverted. It measures 7.1 x 3.5 x 3.9 cm. There is a 2.2 x 2.0 x 2.0 cm submucosal fibroid within the uterine fundus.  Endometrium  Thickness: Unremarkable measuring 3 mm No focal abnormality visualized.  Right ovary  Measurements: Measures 2.8 x 2.4 x 2.4 cm. Normal appearance/no adnexal mass.  Left ovary  Measurements: Measures 2.1 x 2.1 x 1.5 cm. Normal appearance/no adnexal mass.  Other findings  Trace free fluid.  IMPRESSION: Submucosal fibroid within the uterine fundus.   Electronically Signed  By: Lovey Newcomer M.D.  On: 12/20/2012 14:25      Data Reviewed Labs Ultrasound  Assessment     AUB Dysmenorrhea Submucous Fibroid     Plan    Referred to Rep. Endo., Dr. Kerin Perna for consultation for evaluation for a Hysteroscopic Myomectomy in a nulligravida that desires future fertility.   Orders Placed This Encounter  Procedures  . Urine culture  . US Transvaginal Non-OB    Standing Status: Future     Number of Occurrences:      Standing Expiration Date: 08/14/2015    Order Specific Question:  Reason for Exam (SYMPTOM  OR DIAGNOSIS REQUIRED)    Answer:  Submucous fibroid.  Follow up.    Order Specific Question:  Preferred imaging location?    Answer:  [facility]  . US OB Transvaginal    Standing Status: Future     Number of Occurrences:      Standing Expiration Date: 08/14/2015    Order Specific Question:  Reason for Exam (SYMPTOM  OR DIAGNOSIS REQUIRED)    Answer:  Submucous fibroid.  Follow up.    Order Specific Question:  Preferred imaging location?    Answer:  [facility]  . Ambulatory referral to Endocrinology    Referral Priority:  Routine    Referral Type:  Consultation    Referral Reason:  Specialty Services Required    Number of Visits  Requested:  1  . POCT urinalysis dipstick   Meds ordered this encounter  Medications  . cholecalciferol (VITAMIN D) 1000 UNITS tablet    Sig: Take 1,000 Units by mouth daily.

## 2014-06-15 LAB — URINE CULTURE
Colony Count: NO GROWTH
Organism ID, Bacteria: NO GROWTH

## 2014-06-16 ENCOUNTER — Telehealth: Payer: Self-pay

## 2014-06-16 NOTE — Telephone Encounter (Signed)
left message with patient - wanted to be sure Dr. Charlett Lango office called her with appt - called them and they made it for 08/21/14 - 1PM  told her to call [facility]

## 2014-06-25 ENCOUNTER — Other Ambulatory Visit: Payer: Self-pay | Admitting: Obstetrics

## 2014-06-25 DIAGNOSIS — D250 Submucous leiomyoma of uterus: Secondary | ICD-10-CM

## 2014-06-26 ENCOUNTER — Ambulatory Visit (HOSPITAL_COMMUNITY)
Admission: RE | Admit: 2014-06-26 | Discharge: 2014-06-26 | Disposition: A | Discharge status: Home / Self Care | Payer: Commercial Indemnity | Source: Ambulatory Visit | Attending: Obstetrics | Admitting: Obstetrics

## 2014-06-26 DIAGNOSIS — N938 Other specified abnormal uterine and vaginal bleeding: Secondary | ICD-10-CM | POA: Diagnosis present

## 2014-06-26 DIAGNOSIS — N939 Abnormal uterine and vaginal bleeding, unspecified: Secondary | ICD-10-CM | POA: Insufficient documentation

## 2014-06-26 DIAGNOSIS — D250 Submucous leiomyoma of uterus: Secondary | ICD-10-CM | POA: Diagnosis not present

## 2014-06-26 DIAGNOSIS — N946 Dysmenorrhea, unspecified: Secondary | ICD-10-CM | POA: Insufficient documentation

## 2014-07-04 ENCOUNTER — Other Ambulatory Visit: Payer: Self-pay | Admitting: Obstetrics

## 2014-07-05 ENCOUNTER — Other Ambulatory Visit: Payer: Self-pay | Admitting: Obstetrics

## 2014-07-05 ENCOUNTER — Telehealth: Payer: Self-pay

## 2014-07-05 DIAGNOSIS — N946 Dysmenorrhea, unspecified: Secondary | ICD-10-CM

## 2014-07-05 MED ORDER — TRAMADOL HCL 50 MG PO TABS: 50.0000 mg | ORAL_TABLET | Freq: Four times a day (QID) | ORAL | Status: DC | PRN

## 2014-07-05 NOTE — Telephone Encounter (Signed)
Patient called for [facility] - Dr. Charlett Lango number it is [phone] - gave to patient

## 2014-11-05 ENCOUNTER — Other Ambulatory Visit: Payer: Self-pay | Admitting: Obstetrics

## 2014-11-16 ENCOUNTER — Encounter (HOSPITAL_BASED_OUTPATIENT_CLINIC_OR_DEPARTMENT_OTHER): Payer: Self-pay | Admitting: *Deleted

## 2014-11-17 ENCOUNTER — Encounter (HOSPITAL_BASED_OUTPATIENT_CLINIC_OR_DEPARTMENT_OTHER): Payer: Self-pay | Admitting: *Deleted

## 2014-11-17 NOTE — Progress Notes (Signed)
NPO AFTER MN.  ARRIVE AT 0830.  NEEDS HG AND URINE PREG.  

## 2014-11-22 ENCOUNTER — Encounter (HOSPITAL_BASED_OUTPATIENT_CLINIC_OR_DEPARTMENT_OTHER)
Admission: RE | Disposition: A | Discharge status: Home / Self Care | Payer: Self-pay | Source: Ambulatory Visit | Attending: Obstetrics and Gynecology

## 2014-11-22 ENCOUNTER — Ambulatory Visit (HOSPITAL_BASED_OUTPATIENT_CLINIC_OR_DEPARTMENT_OTHER): Payer: Commercial Indemnity | Admitting: Anesthesiology

## 2014-11-22 ENCOUNTER — Ambulatory Visit (HOSPITAL_BASED_OUTPATIENT_CLINIC_OR_DEPARTMENT_OTHER)
Admission: RE | Admit: 2014-11-22 | Discharge: 2014-11-22 | Disposition: A | Discharge status: Home / Self Care | Payer: Commercial Indemnity | Source: Ambulatory Visit | Attending: Obstetrics and Gynecology | Admitting: Obstetrics and Gynecology

## 2014-11-22 ENCOUNTER — Encounter (HOSPITAL_BASED_OUTPATIENT_CLINIC_OR_DEPARTMENT_OTHER): Payer: Self-pay | Admitting: *Deleted

## 2014-11-22 DIAGNOSIS — Z79899 Other long term (current) drug therapy: Secondary | ICD-10-CM | POA: Diagnosis not present

## 2014-11-22 DIAGNOSIS — N830 Follicular cyst of ovary: Secondary | ICD-10-CM | POA: Diagnosis not present

## 2014-11-22 DIAGNOSIS — F17210 Nicotine dependence, cigarettes, uncomplicated: Secondary | ICD-10-CM | POA: Insufficient documentation

## 2014-11-22 DIAGNOSIS — N920 Excessive and frequent menstruation with regular cycle: Secondary | ICD-10-CM | POA: Diagnosis present

## 2014-11-22 DIAGNOSIS — D251 Intramural leiomyoma of uterus: Secondary | ICD-10-CM | POA: Insufficient documentation

## 2014-11-22 DIAGNOSIS — A630 Anogenital (venereal) warts: Secondary | ICD-10-CM | POA: Diagnosis not present

## 2014-11-22 DIAGNOSIS — D250 Submucous leiomyoma of uterus: Secondary | ICD-10-CM | POA: Diagnosis not present

## 2014-11-22 HISTORY — PX: EXCISION OF SKIN TAG: SHX6270

## 2014-11-22 HISTORY — DX: Presence of spectacles and contact lenses: Z97.3

## 2014-11-22 HISTORY — DX: Other specified abnormal uterine and vaginal bleeding: N93.8

## 2014-11-22 HISTORY — DX: Leiomyoma of uterus, unspecified: D25.9

## 2014-11-22 HISTORY — PX: LAPAROSCOPY: SHX197

## 2014-11-22 HISTORY — PX: LAPAROSCOPIC GELPORT ASSISTED MYOMECTOMY: SHX6549

## 2014-11-22 HISTORY — DX: Personal history of other diseases of the female genital tract: Z87.42

## 2014-11-22 HISTORY — DX: Personal history of other infectious and parasitic diseases: Z86.19

## 2014-11-22 LAB — POCT HEMOGLOBIN-HEMACUE: HEMOGLOBIN: 12.2 g/dL (ref 12.0–15.0)

## 2014-11-22 LAB — TYPE AND SCREEN
ABO/RH(D): O POS
Antibody Screen: NEGATIVE

## 2014-11-22 LAB — ABO/RH: ABO/RH(D): O POS

## 2014-11-22 SURGERY — LAPAROSCOPIC GELPORT ASSISTED MYOMECTOMY
Anesthesia: General

## 2014-11-22 MED ORDER — MIDAZOLAM HCL 2 MG/2ML IJ SOLN
INTRAMUSCULAR | Status: AC
  Filled 2014-11-22: qty 2

## 2014-11-22 MED ORDER — KETOROLAC TROMETHAMINE 30 MG/ML IJ SOLN
INTRAMUSCULAR | Status: DC | PRN
  Administered 2014-11-22: 30 mg via INTRAVENOUS

## 2014-11-22 MED ORDER — CEFAZOLIN SODIUM-DEXTROSE 2-3 GM-% IV SOLR
2.0000 g | INTRAVENOUS | Status: DC
  Filled 2014-11-22: qty 50

## 2014-11-22 MED ORDER — METOCLOPRAMIDE HCL 5 MG/ML IJ SOLN
INTRAMUSCULAR | Status: DC | PRN
  Administered 2014-11-22: 10 mg via INTRAVENOUS

## 2014-11-22 MED ORDER — LACTATED RINGERS IV SOLN
INTRAVENOUS | Status: DC
  Administered 2014-11-22: 15:00:00 via INTRAVENOUS
  Filled 2014-11-22: qty 1000

## 2014-11-22 MED ORDER — NEOSTIGMINE METHYLSULFATE 10 MG/10ML IV SOLN
INTRAVENOUS | Status: DC | PRN
  Administered 2014-11-22: 5 mg via INTRAVENOUS

## 2014-11-22 MED ORDER — METHYLENE BLUE 1 % INJ SOLN
INTRAMUSCULAR | Status: DC | PRN
  Administered 2014-11-22: 1 mL via SUBMUCOSAL

## 2014-11-22 MED ORDER — ONDANSETRON HCL 4 MG PO TABS: 4.0000 mg | ORAL_TABLET | Freq: Three times a day (TID) | ORAL | Status: DC | PRN

## 2014-11-22 MED ORDER — FENTANYL CITRATE (PF) 100 MCG/2ML IJ SOLN
INTRAMUSCULAR | Status: AC
  Filled 2014-11-22: qty 2

## 2014-11-22 MED ORDER — FENTANYL CITRATE (PF) 100 MCG/2ML IJ SOLN
INTRAMUSCULAR | Status: AC
Start: 2014-11-22 — End: 2014-11-22
  Filled 2014-11-22: qty 4

## 2014-11-22 MED ORDER — OXYCODONE HCL 5 MG PO TABS
ORAL_TABLET | ORAL | Status: AC
  Filled 2014-11-22: qty 1

## 2014-11-22 MED ORDER — FENTANYL CITRATE (PF) 100 MCG/2ML IJ SOLN
INTRAMUSCULAR | Status: DC | PRN
  Administered 2014-11-22 (×3): 50 ug via INTRAVENOUS
  Administered 2014-11-22: 100 ug via INTRAVENOUS
  Administered 2014-11-22: 50 ug via INTRAVENOUS

## 2014-11-22 MED ORDER — CEFAZOLIN SODIUM-DEXTROSE 2-3 GM-% IV SOLR
2.0000 g | INTRAVENOUS | Status: AC
  Administered 2014-11-22: 2 g via INTRAVENOUS
  Filled 2014-11-22: qty 50

## 2014-11-22 MED ORDER — LACTATED RINGERS IV SOLN
INTRAVENOUS | Status: DC
  Administered 2014-11-22 (×2): via INTRAVENOUS
  Filled 2014-11-22: qty 1000

## 2014-11-22 MED ORDER — OXYCODONE HCL 5 MG PO TABS
5.0000 mg | ORAL_TABLET | Freq: Once | ORAL | Status: AC
  Administered 2014-11-22: 5 mg via ORAL
  Filled 2014-11-22: qty 1

## 2014-11-22 MED ORDER — MIDAZOLAM HCL 5 MG/5ML IJ SOLN
INTRAMUSCULAR | Status: DC | PRN
  Administered 2014-11-22: 2 mg via INTRAVENOUS

## 2014-11-22 MED ORDER — ONDANSETRON HCL 4 MG/2ML IJ SOLN
INTRAMUSCULAR | Status: DC | PRN
  Administered 2014-11-22: 4 mg via INTRAVENOUS

## 2014-11-22 MED ORDER — CEFAZOLIN SODIUM-DEXTROSE 2-3 GM-% IV SOLR
INTRAVENOUS | Status: AC
  Filled 2014-11-22: qty 50

## 2014-11-22 MED ORDER — LIDOCAINE HCL (CARDIAC) 20 MG/ML IV SOLN
INTRAVENOUS | Status: DC | PRN
  Administered 2014-11-22: 100 mg via INTRAVENOUS

## 2014-11-22 MED ORDER — OXYCODONE-ACETAMINOPHEN 7.5-325 MG PO TABS: 1.0000 | ORAL_TABLET | ORAL | Status: DC | PRN

## 2014-11-22 MED ORDER — DEXAMETHASONE SODIUM PHOSPHATE 4 MG/ML IJ SOLN
INTRAMUSCULAR | Status: DC | PRN
Start: 2014-11-22 — End: 2014-11-22
  Administered 2014-11-22: 10 mg via INTRAVENOUS

## 2014-11-22 MED ORDER — FENTANYL CITRATE (PF) 100 MCG/2ML IJ SOLN
25.0000 ug | INTRAMUSCULAR | Status: DC | PRN
  Administered 2014-11-22: 50 ug via INTRAVENOUS
  Administered 2014-11-22 (×2): 25 ug via INTRAVENOUS
  Filled 2014-11-22: qty 1

## 2014-11-22 MED ORDER — VASOPRESSIN 20 UNIT/ML IV SOLN
INTRAVENOUS | Status: DC | PRN
  Administered 2014-11-22: 20 mL via INTRAMUSCULAR

## 2014-11-22 MED ORDER — BUPIVACAINE-EPINEPHRINE (PF) 0.5% -1:200000 IJ SOLN
INTRAMUSCULAR | Status: DC | PRN
  Administered 2014-11-22: 15 mL

## 2014-11-22 MED ORDER — PROPOFOL 10 MG/ML IV BOLUS
INTRAVENOUS | Status: DC | PRN
  Administered 2014-11-22: 200 mg via INTRAVENOUS

## 2014-11-22 MED ORDER — GLYCOPYRROLATE 0.2 MG/ML IJ SOLN
INTRAMUSCULAR | Status: DC | PRN
  Administered 2014-11-22: .7 mg via INTRAVENOUS

## 2014-11-22 MED ORDER — ROCURONIUM BROMIDE 100 MG/10ML IV SOLN
INTRAVENOUS | Status: DC | PRN
  Administered 2014-11-22 (×2): 10 mg via INTRAVENOUS
  Administered 2014-11-22: 40 mg via INTRAVENOUS

## 2014-11-22 MED ORDER — LACTATED RINGERS IR SOLN
Status: DC | PRN
  Administered 2014-11-22: 3000 mL

## 2014-11-22 MED ORDER — ACETAMINOPHEN 10 MG/ML IV SOLN
INTRAVENOUS | Status: DC | PRN
  Administered 2014-11-22: 1000 mg via INTRAVENOUS

## 2014-11-22 SURGICAL SUPPLY — 79 items
APPLICATOR COTTON TIP 6IN STRL (MISCELLANEOUS) ×3 IMPLANT
BAG SPEC RTRVL LRG 6X4 10 (ENDOMECHANICALS)
BARRIER ADHS 3X4 INTERCEED (GAUZE/BANDAGES/DRESSINGS) ×1 IMPLANT
BLADE SURG 10 STRL SS (BLADE) ×3 IMPLANT
BLADE SURG 11 STRL SS (BLADE) ×3 IMPLANT
BRR ADH 4X3 ABS CNTRL BYND (GAUZE/BANDAGES/DRESSINGS) ×2
CATH ROBINSON RED A/P 16FR (CATHETERS) ×2 IMPLANT
CLEANER CAUTERY TIP 5X5 PAD (MISCELLANEOUS) IMPLANT
COVER MAYO STAND STRL (DRAPES) ×3 IMPLANT
DEVICE TROCAR PUNCTURE CLOSURE (ENDOMECHANICALS) IMPLANT
DRAPE UNDERBUTTOCKS STRL (DRAPE) ×3 IMPLANT
DRSG OPSITE POSTOP 3X4 (GAUZE/BANDAGES/DRESSINGS) ×1 IMPLANT
DRSG TEGADERM 4X4.75 (GAUZE/BANDAGES/DRESSINGS) IMPLANT
DRSG TELFA 3X8 NADH (GAUZE/BANDAGES/DRESSINGS) IMPLANT
ELECT BLADE 6.5 .24CM SHAFT (ELECTRODE) IMPLANT
ELECT NDL TIP 2.8 STRL (NEEDLE) IMPLANT
ELECT NEEDLE TIP 2.8 STRL (NEEDLE) IMPLANT
ELECT REM PT RETURN 9FT ADLT (ELECTROSURGICAL) ×3
ELECTRODE REM PT RTRN 9FT ADLT (ELECTROSURGICAL) ×2 IMPLANT
FILTER SMOKE EVAC LAPAROSHD (FILTER) ×3 IMPLANT
GLOVE BIO SURGEON STRL SZ8 (GLOVE) ×3 IMPLANT
GLOVE BIOGEL PI IND STRL 8.5 (GLOVE) ×2 IMPLANT
GLOVE BIOGEL PI INDICATOR 8.5 (GLOVE) ×1
GOWN STRL REUS W/ TWL LRG LVL3 (GOWN DISPOSABLE) ×4 IMPLANT
GOWN STRL REUS W/TWL LRG LVL3 (GOWN DISPOSABLE) ×6
HOLDER FOLEY CATH W/STRAP (MISCELLANEOUS) ×3 IMPLANT
LEGGING LITHOTOMY PAIR STRL (DRAPES) ×3 IMPLANT
LIQUID BAND (GAUZE/BANDAGES/DRESSINGS) IMPLANT
MANIPULATOR UTERINE 4.5 ZUMI (MISCELLANEOUS) ×3 IMPLANT
NDL HYPO 25X1 1.5 SAFETY (NEEDLE) ×1 IMPLANT
NDL INSUFFLATION 14GA 120MM (NEEDLE) ×1 IMPLANT
NDL SAFETY ECLIPSE 18X1.5 (NEEDLE) ×2 IMPLANT
NEEDLE HYPO 18GX1.5 SHARP (NEEDLE) ×3
NEEDLE HYPO 25X1 1.5 SAFETY (NEEDLE) ×3 IMPLANT
NEEDLE INSUFFLATION 14GA 120MM (NEEDLE) ×3 IMPLANT
NS IRRIG 500ML POUR BTL (IV SOLUTION) ×3 IMPLANT
PACK BASIN DAY SURGERY FS (CUSTOM PROCEDURE TRAY) ×3 IMPLANT
PACK LAPAROSCOPY II (CUSTOM PROCEDURE TRAY) ×3 IMPLANT
PAD CLEANER CAUTERY TIP 5X5 (MISCELLANEOUS)
PAD DRESSING TELFA 3X8 NADH (GAUZE/BANDAGES/DRESSINGS) IMPLANT
PAD OB MATERNITY 4.3X12.25 (PERSONAL CARE ITEMS) ×3 IMPLANT
PADDING ION DISPOSABLE (MISCELLANEOUS) ×3 IMPLANT
PENCIL BUTTON HOLSTER BLD 10FT (ELECTRODE) ×2 IMPLANT
POUCH SPECIMEN RETRIEVAL 10MM (ENDOMECHANICALS) IMPLANT
SCALPEL HARMONIC ACE (MISCELLANEOUS) IMPLANT
SEALER TISSUE G2 CVD JAW 35 (ENDOMECHANICALS) IMPLANT
SEALER TISSUE G2 CVD JAW 45CM (ENDOMECHANICALS) IMPLANT
SET IRRIG TUBING LAPAROSCOPIC (IRRIGATION / IRRIGATOR) ×3 IMPLANT
SOLUTION ANTI FOG 6CC (MISCELLANEOUS) ×3 IMPLANT
SPONGE LAP 18X18 X RAY DECT (DISPOSABLE) IMPLANT
SUT MNCRL AB 4-0 PS2 18 (SUTURE) ×4 IMPLANT
SUT PROLENE 0 CT 1 30 (SUTURE) ×2 IMPLANT
SUT VIC AB 0 CT1 36 (SUTURE) IMPLANT
SUT VIC AB 2-0 CT1 (SUTURE) ×3 IMPLANT
SUT VIC AB 2-0 CT1 27 (SUTURE)
SUT VIC AB 2-0 CT1 TAPERPNT 27 (SUTURE) IMPLANT
SUT VIC AB 2-0 CT2 27 (SUTURE) ×1 IMPLANT
SUT VIC AB 2-0 UR6 27 (SUTURE) IMPLANT
SUT VIC AB 4-0 SH 27 (SUTURE) ×3
SUT VIC AB 4-0 SH 27XANBCTRL (SUTURE) IMPLANT
SUT VICRYL 0 TIES 12 18 (SUTURE) IMPLANT
SYR 20CC LL (SYRINGE) IMPLANT
SYR 30ML LL (SYRINGE) IMPLANT
SYR 3ML 23GX1 SAFETY (SYRINGE) IMPLANT
SYR 5ML LL (SYRINGE) ×3 IMPLANT
SYR CONTROL 10ML LL (SYRINGE) ×3 IMPLANT
SYRINGE 10CC LL (SYRINGE) ×3 IMPLANT
SYRINGE 12CC LL (MISCELLANEOUS) ×3 IMPLANT
SYS LAPSCP GELPORT 120MM (MISCELLANEOUS) ×3
SYSTEM LAPSCP GELPORT 120MM (MISCELLANEOUS) IMPLANT
TOWEL OR 17X24 6PK STRL BLUE (TOWEL DISPOSABLE) ×6 IMPLANT
TRAY DSU PREP LF (CUSTOM PROCEDURE TRAY) ×3 IMPLANT
TRAY FOLEY CATH SILVER 14FR (SET/KITS/TRAYS/PACK) ×3 IMPLANT
TROCAR OPTI TIP 5M 100M (ENDOMECHANICALS) ×7 IMPLANT
TROCAR XCEL DIL TIP R 11M (ENDOMECHANICALS) IMPLANT
TUBING INSUFFLATION 10FT LAP (TUBING) ×3 IMPLANT
TUBING INSUFFLATION W/FILTER (TUBING) ×3 IMPLANT
WARMER LAPAROSCOPE (MISCELLANEOUS) ×3 IMPLANT
WATER STERILE IRR 500ML POUR (IV SOLUTION) ×3 IMPLANT

## 2014-11-22 NOTE — Discharge Instructions (Signed)

## 2014-11-22 NOTE — H&P (Signed)
Krista Hogan is a 34 y.o. female , originally referred to me by Dr. Jodi Mourning, for L/S myomectomy.  She was diagnosed with fibroids because of abnormal uterine bleeding and was placed on Femara and Danazol.  She developed breakthrough bleeding and had to stop the pills.  She has been having monthly periods but with heavy flow and prolonged duration.  Patient would like to preserve her childbearing potential.  Pertinent Gynecological History: Menses: flow is excessive with use of 3 pads or tampons on heaviest days Bleeding: dysfunctional uterine bleeding Contraception: none DES exposure: denies Blood transfusions: none Sexually transmitted diseases: no past history Previous GYN Procedures: none  Last mammogram: normal Last pap: normal   Menstrual History: Menarche age: 40 No LMP recorded.    Past Medical History  Diagnosis Date  . History of abnormal cervical Pap smear     2011  . History of HPV infection   . Uterine fibroid   . DUB (dysfunctional uterine bleeding)   . Wears glasses                     Past Surgical History  Procedure Laterality Date  . Leep  2011  . Tonsillectomy and adenoidectomy  age 23  . Wisdom tooth extraction  2006             Family History  Problem Relation Age of Onset  . Fibroids Mother    No hereditary disease.  No cancer of breast, ovary, uterus. No cutaneous leiomyomatosis or renal cell carcinoma.  Social History   Social History  . Marital Status: Single    Spouse Name: N/A  . Number of Children: N/A  . Years of Education: N/A   Occupational History  . Not on file.   Social History Main Topics  . Smoking status: Current Some Day Smoker -- 0.25 packs/day for 10 years    Types: Cigarettes  . Smokeless tobacco: Never Used  . Alcohol Use: 0.0 oz/week    0 Standard drinks or equivalent per week     Comment: SOCIAL  . Drug Use: No  . Sexual Activity: Not on file   Other Topics Concern  . Not on file   Social History  Narrative    No Known Allergies  No current facility-administered medications on file prior to encounter.   Current Outpatient Prescriptions on File Prior to Encounter  Medication Sig Dispense Refill  . cetirizine (ZYRTEC) 10 MG tablet Take 10 mg by mouth daily.    . cholecalciferol (VITAMIN D) 1000 UNITS tablet Take 1,000 Units by mouth daily.    . traMADol (ULTRAM) 50 MG tablet Take 1-2 tablets (50-100 mg total) by mouth every 6 (six) hours as needed for moderate pain. 40 tablet 4  . fish oil-omega-3 fatty acids 1000 MG capsule Take 1 g by mouth daily.    . Multiple Vitamin (MULTIVITAMIN WITH MINERALS) TABS tablet Take 1 tablet by mouth daily.       Review of Systems  Constitutional: Negative.   HENT: Negative.   Eyes: Negative.   Respiratory: Negative.   Cardiovascular: Negative.   Gastrointestinal: Negative.   Genitourinary: Negative.   Musculoskeletal: Negative.   Skin: Negative.   Neurological: Negative.   Endo/Heme/Allergies: Negative.   Psychiatric/Behavioral: Negative.      Physical Exam  BP 115/74 mmHg  Pulse 72  Temp(Src) 98.3 F (36.8 C) (Oral)  Resp 16  Ht 5' 4.5" (1.638 m)  Wt 99.111 kg (218 lb 8 oz)  BMI 36.94 kg/m2  SpO2 100%  LMP  (LMP Unknown) Constitutional: She is oriented to person, place, and time. She appears well-developed and well-nourished.  HENT:  Head: Normocephalic and atraumatic.  Nose: Nose normal.  Mouth/Throat: Oropharynx is clear and moist. No oropharyngeal exudate.  Eyes: Conjunctivae normal and EOM are normal. Pupils are equal, round, and reactive to light. No scleral icterus.  Neck: Normal range of motion. Neck supple. No tracheal deviation present. No thyromegaly present.  Cardiovascular: Normal rate.   Respiratory: Effort normal and breath sounds normal.  GI: Soft. Bowel sounds are normal. She exhibits no distension and no mass. There is no tenderness.  Lymphadenopathy:    She has no cervical adenopathy.  Neurological: She  is alert and oriented to person, place, and time. She has normal reflexes.  Skin: Skin is warm.  Psychiatric: She has a normal mood and affect. Her behavior is normal. Judgment and thought content normal.   Assessment/Plan:  Intramural and subserosal uterine myomas, causing menorrhagia and pressure sensation. Preoperative for L/S Gelport assisted myomectomy Benefits and risks of L/S myomectomy were discussed with the patient and her family member again.  Bowel prep instructions were given.  All of patient's questions were answered.  She verbalized understanding.  She knows that she will need a cesarean delivery for future pregnancies, and that it is recommended she does not conceive for 2-3 months for uterus to heal.   Governor Specking, MD

## 2014-11-22 NOTE — Anesthesia Postprocedure Evaluation (Signed)
  Anesthesia Post-op Note  Patient: Krista Hogan  Procedure(s) Performed: Procedure(s) (LRB): LAPAROSCOPIC GELPORT ASSISTED MYOMECTOMY WITH CHROMOTUBATION (N/A) LAPAROSCOPY DIAGNOSTIC (N/A) EXCISION OF SKIN TAG VULVA  Patient Location: PACU  Anesthesia Type: General  Level of Consciousness: awake and alert   Airway and Oxygen Therapy: Patient Spontanous Breathing  Post-op Pain: mild  Post-op Assessment: Post-op Vital signs reviewed, Patient's Cardiovascular Status Stable, Respiratory Function Stable, Patent Airway and No signs of Nausea or vomiting  Last Vitals:  Filed Vitals:   11/22/14 1345  BP: 128/76  Pulse: 70  Temp:   Resp: 21    Post-op Vital Signs: stable   Complications: No apparent anesthesia complications

## 2014-11-22 NOTE — Anesthesia Procedure Notes (Signed)
Procedure Name: Intubation Date/Time: 11/22/2014 11:01 AM Performed by: Bethena Roys T Pre-anesthesia Checklist: Patient identified, Emergency Drugs available, Suction available and Patient being monitored Patient Re-evaluated:Patient Re-evaluated prior to inductionOxygen Delivery Method: Circle System Utilized Preoxygenation: Pre-oxygenation with 100% oxygen Intubation Type: IV induction Ventilation: Mask ventilation without difficulty Laryngoscope Size: Mac and 3 Tube type: Oral Tube size: 7.0 mm Number of attempts: 1 Airway Equipment and Method: Stylet Placement Confirmation: ETT inserted through vocal cords under direct vision,  positive ETCO2 and breath sounds checked- equal and bilateral Secured at: 21 cm Tube secured with: Tape Dental Injury: Teeth and Oropharynx as per pre-operative assessment

## 2014-11-22 NOTE — Transfer of Care (Addendum)
Last Vitals:  Filed Vitals:   11/22/14 0830  BP: 115/74  Pulse: 72  Temp: 36.8 C  Resp: 16    Immediate Anesthesia Transfer of Care Note  Patient: Krista Hogan  Procedure(s) Performed: Procedure(s) (LRB): LAPAROSCOPIC GELPORT ASSISTED MYOMECTOMY WITH CHROMOTUBATION (N/A) LAPAROSCOPY DIAGNOSTIC (N/A) EXCISION OF SKIN TAG VULVA  Patient Location: PACU  Anesthesia Type: General  Level of Consciousness: awake, alert  and oriented  Airway & Oxygen Therapy: Patient Spontanous Breathing and Patient connected to face mask oxygen  Post-op Assessment: Report given to PACU RN and Post -op Vital signs reviewed and stable  Post vital signs: Reviewed and stable  Complications: No apparent anesthesia complications

## 2014-11-22 NOTE — Anesthesia Preprocedure Evaluation (Addendum)
Anesthesia Evaluation  Patient identified by MRN, date of birth, ID band Patient awake    Reviewed: Allergy & Precautions, H&P , NPO status , Patient's Chart, lab work & pertinent test results  Airway Mallampati: II  TM Distance: >3 FB Neck ROM: full    Dental no notable dental hx. (+) Dental Advisory Given, Teeth Intact   Pulmonary neg pulmonary ROS, Current Smoker,    Pulmonary exam normal breath sounds clear to auscultation       Cardiovascular Exercise Tolerance: Good negative cardio ROS Normal cardiovascular exam Rhythm:regular Rate:Normal     Neuro/Psych negative neurological ROS  negative psych ROS   GI/Hepatic negative GI ROS, Neg liver ROS,   Endo/Other  negative endocrine ROS  Renal/GU negative Renal ROS  negative genitourinary   Musculoskeletal   Abdominal   Peds  Hematology negative hematology ROS (+)   Anesthesia Other Findings   Reproductive/Obstetrics negative OB ROS                           Anesthesia Physical Anesthesia Plan  ASA: II  Anesthesia Plan: General   Post-op Pain Management:    Induction: Intravenous  Airway Management Planned: Oral ETT  Additional Equipment:   Intra-op Plan:   Post-operative Plan: Extubation in OR  Informed Consent: I have reviewed the patients History and Physical, chart, labs and discussed the procedure including the risks, benefits and alternatives for the proposed anesthesia with the patient or authorized representative who has indicated his/her understanding and acceptance.   Dental Advisory Given  Plan Discussed with: CRNA and Surgeon  Anesthesia Plan Comments:        Anesthesia Quick Evaluation

## 2014-11-22 NOTE — Op Note (Addendum)
Operative Note  Preoperative diagnosis: Menorrhagia, submucosal/intramural myoma, pelvic pain, vulvar skin tags   Postoperative diagnosis: Menorrhagia, submucosal/intramural myoma, pelvic pain, vulvar skin tags  Procedure: Laparoscopy, GelPort assisted myomectomy, chromopertubation, excision of vulvar skin tags  Anesthesia: Gen. endotracheal  Complications: None  Estimated blood loss: 50 mL  Specimens: Skin tags, uterine myoma, to pathology  Findings: On exam under anesthesia, there were 1-2 mm skin tags on the medial thighs and around the vulva which were removed. External genitalia otherwise look normal. The vagina appeared normal. The cervix was grossly normal. The uterus sounded to 8 cm. Uterine corpus was palpated at 7-8 gestational weeks size. There were no adnexal masses. There was no posterior cul-de-sac nodularity or induration.  On laparoscopy, upper abdomen, liver surface and diaphragm surfaces were normal. Gallbladder was not visualized. The appendix was not visualized. There was about 20 mL of dark unclotted blood in the pelvis. Patient had been having breakthrough bleeding on Femara and danazol. The pelvic peritoneum looked mostly normal.  The uterus was enlarged with the transmural myoma measuring 4.5 x 3.5 x 3 cm in the anterior wall. The posterior aspect of this myoma went although after the endometrium. No other myomas were prominent.  Both fallopian tubes were normal proximally and distally but the methylene blue did not go into the fallopian tubes. This may have been due to an artifact during the chromopertubation. Both ovaries appeared normal as well as the ovarian fossae. The right ovary contained a 2 cm follicular cyst.  Description of the procedure: The patient was placed in dorsal supine position and general endotracheal anesthesia was given. 2 g of cefazolin were given intravenously for prophylaxis. Patient was placed in lithotomy position. She was prepped and draped  inside manner. A Foley catheter was inserted into the bladder. A ZUMI catheter was placed into the uterine cavity and connected to a syringe containing diluted methylene blue solution. The surgeon was regloved and a surgical field was created on the abdomen.  After preemptive anesthesia of all surgical sites with 0.25% bupivacaine with 1 200,000 epinephrine, a 5 mm intraumbilical skin incision was made and a Verress needle was inserted. Its correct location was confirmed. A pneumoperitoneum was created with carbon dioxide.  5 mm laparoscope with a 30 lens was inserted and video laparoscopy was started . A left lower quadrant 5 mm and a right lower quadrant  5 mm incisions were made and ancillary trochars were placed under direct visualization. Above findings were noted.  A dilute solution of vasopressin (0.4 units per mL) was injected into the myometrium until it blanched. (20 mL) using at 39 W cutting current on the needle tip electrode a horizontal anterior myometrial incision was made until the myoma was seen. Next using the tenaculum the myoma was removed from its surgical capsule by blunt and sharp dissection.  We made a 3 cm fence the incision at the pubic hairline. After dissection of the anatomic layers, the peritoneal cavity was entered. A GelPort was placed in the rest of the procedure continued either using this as a laparoscopic port or as a minilaparotomy port. The myoma was extracted through the incision. Using the same port the myoma defect was closed in 3 layers. The first layer was a continuous interlocking suture of 2-0 Vicryl. The second layer was a continuous nonlocking suture of 2-0 Vicryl. The serosa was approximated with 4-0 Vicryl on an SH needle. Good hemostasis was insured. An Interceed sheath was placed on the myoma incision. The fascia  of the 3 cm incision was closed with 2-0 Vicryl continuous suture. Rest of the pelvis was copiously irrigated and aspirated and hemostasis and lap  pad counts were checked. No other lesions were found on the pelvic peritoneum. Chromotubation was tried again but did tubes did not fill. She will need postoperative HSG. The gas was allowed to escape. The incisions were approximated with 4-0 Monocryl subcutaneous  sutures.  Using an ophthalmologic electrocautery the vulvar skin tags were elevated on absence and excised with electrocautery. Hemostasis was achieved. Specimens were sent to pathology. The patient tolerated the procedure well and was transferred to recovery in satisfactory condition.  SPECIAL NOTE: Because of the extent of the myometrial incision, it is recommended that the patient deliver her pregnancies via cesarean section.  Governor Specking, MD

## 2014-11-23 ENCOUNTER — Encounter (HOSPITAL_BASED_OUTPATIENT_CLINIC_OR_DEPARTMENT_OTHER): Payer: Self-pay | Admitting: Obstetrics and Gynecology

## 2014-11-23 LAB — POCT PREGNANCY, URINE: PREG TEST UR: NEGATIVE

## 2014-11-27 ENCOUNTER — Encounter (HOSPITAL_BASED_OUTPATIENT_CLINIC_OR_DEPARTMENT_OTHER): Payer: Self-pay | Admitting: Obstetrics and Gynecology

## 2014-12-14 ENCOUNTER — Encounter: Payer: Self-pay | Admitting: Obstetrics

## 2014-12-14 ENCOUNTER — Ambulatory Visit (INDEPENDENT_AMBULATORY_CARE_PROVIDER_SITE_OTHER): Payer: Managed Care, Other (non HMO) | Admitting: Obstetrics

## 2014-12-14 VITALS — BP 105/74 | HR 85 | Temp 98.7°F | Ht 64.0 in | Wt 225.0 lb

## 2014-12-14 DIAGNOSIS — N939 Abnormal uterine and vaginal bleeding, unspecified: Secondary | ICD-10-CM | POA: Diagnosis not present

## 2014-12-14 DIAGNOSIS — D250 Submucous leiomyoma of uterus: Secondary | ICD-10-CM

## 2014-12-14 DIAGNOSIS — A630 Anogenital (venereal) warts: Secondary | ICD-10-CM

## 2014-12-14 DIAGNOSIS — N946 Dysmenorrhea, unspecified: Secondary | ICD-10-CM | POA: Diagnosis not present

## 2014-12-14 NOTE — Progress Notes (Signed)
Patient ID: Krista Hogan, female   DOB: November 15, 1980, 34 y.o.   MRN: [id number]  Chief Complaint  Patient presents with  . Genital Warts    HPI Krista Hogan is a 34 y.o. female.  Warts resected by Dr. Eda Keys, with a few residual warts that need TCA application.  HPI  Past Medical History  Diagnosis Date  . History of abnormal cervical Pap smear     2011  . History of HPV infection   . Uterine fibroid   . DUB (dysfunctional uterine bleeding)   . Wears glasses     Past Surgical History  Procedure Laterality Date  . Leep  2011  . Tonsillectomy and adenoidectomy  age 2  . Wisdom tooth extraction  2006  . Laparoscopic gelport assisted myomectomy N/A 11/22/2014    Procedure: LAPAROSCOPIC GELPORT ASSISTED MYOMECTOMY WITH CHROMOTUBATION;  Surgeon: Governor Specking, MD;  Location: [place];  Service: Gynecology;  Laterality: N/A;  . Laparoscopy N/A 11/22/2014    Procedure: LAPAROSCOPY DIAGNOSTIC;  Surgeon: Governor Specking, MD;  Location: [facility];  Service: Gynecology;  Laterality: N/A;  . Excision of skin tag  11/22/2014    Procedure: EXCISION OF SKIN TAG VULVA;  Surgeon: Governor Specking, MD;  Location: [place];  Service: Gynecology;;    Family History  Problem Relation Age of Onset  . Fibroids Mother     Social History Social History  Substance Use Topics  . Smoking status: Current Some Day Smoker -- 0.25 packs/day for 10 years    Types: Cigarettes  . Smokeless tobacco: Never Used  . Alcohol Use: 0.0 oz/week    0 Standard drinks or equivalent per week     Comment: SOCIAL    No Known Allergies  Current Outpatient Prescriptions  Medication Sig Dispense Refill  . cetirizine (ZYRTEC) 10 MG tablet Take 10 mg by mouth daily.    . cholecalciferol (VITAMIN D) 1000 UNITS tablet Take 1,000 Units by mouth daily.    . fish oil-omega-3 fatty acids 1000 MG capsule Take 1 g by mouth daily.    Marland Kitchen ibuprofen (ADVIL,MOTRIN) 800  MG tablet TAKE ONE TABLET BY MOUTH EVERY 8 HOURS AS NEEDED 30 tablet 0  . Multiple Vitamin (MULTIVITAMIN WITH MINERALS) TABS tablet Take 1 tablet by mouth daily.    . traMADol (ULTRAM) 50 MG tablet Take 1-2 tablets (50-100 mg total) by mouth every 6 (six) hours as needed for moderate pain. 40 tablet 4   No current facility-administered medications for this visit.    Review of Systems Review of Systems Constitutional: negative for fatigue and weight loss Respiratory: negative for cough and wheezing Cardiovascular: negative for chest pain, fatigue and palpitations Gastrointestinal: negative for abdominal pain and change in bowel habits Genitourinary:negative Integument/breast: negative for nipple discharge Musculoskeletal:negative for myalgias Neurological: negative for gait problems and tremors Behavioral/Psych: negative for abusive relationship, depression Endocrine: negative for temperature intolerance     Blood pressure 105/74, pulse 85, temperature 98.7 F (37.1 C), height 5\' 4"  (1.626 m), weight 225 lb (102.059 kg).  Physical Exam Physical Exam                 General:  Alert and no distress Pelvis:  External genitalia: normal general appearance.  Two warts on vulva treated with TCA 80%. Urinary system: urethral meatus normal and bladder without fullness, nontender Vaginal: normal without tenderness, induration or masses Cervix: normal appearance Adnexa: normal bimanual exam Uterus: anteverted and non-tender, normal size  Data Reviewed Notes from previous surgery in September 2016.  Assessment     Genital warts.  H/O Uterine Fibroids.  S/P laparoscopic myomectomy.    Plan    TCA 80% applied to 2 small, soft genital warts F/U in 2 weeks  No orders of the defined types were placed in this encounter.   No orders of the defined types were placed in this encounter.

## 2014-12-14 NOTE — Patient Instructions (Signed)
Genital Warts Genital warts are a common STD (sexually transmitted disease). They may appear as small bumps on the tissues of the genital area or anal area. Sometimes, they can become irritated and cause pain. Genital warts are easily passed to other people through sexual contact. Getting treatment is important because genital warts can lead to other problems. In females, the virus that causes genital warts may increase the risk of cervical cancer. CAUSES Genital warts are caused by a virus that is called human papillomavirus (HPV). HPV is spread by having unprotected sex with an infected person. It can be spread through vaginal, anal, and oral sex. Many people do not know that they are infected. They may be infected for years without problems. However, even if they do not have problems, they can pass the infection to their sexual partners. RISK FACTORS Genital warts are more likely to develop in:  People who have unprotected sex.  People who have multiple sexual partners.  People who become sexually active before they are 33 years of age.  Men who are not circumcised.  Women who have a female sexual partner who is not circumcised.  People who have a weakened body defense system (immune system) due to disease or medicine.  People who smoke. SYMPTOMS Symptoms of genital warts include:  Small growths in the genital area or anal area. These warts often grow in clusters.  Itching and irritation in the genital area or anal area.  Bleeding from the warts.  Painful sexual intercourse. DIAGNOSIS Genital warts can usually be diagnosed from their appearance on the vagina, vulva, penis, perineum, anus, or rectum. Tests may also be done, such as:  Biopsy. A tissue sample is removed so it can be looked at under a microscope.  Colposcopy. In females, a magnifying tool is used to examine the vagina and cervix. Certain solutions may be used to make the HPV cells change color so they can be seen more  easily.  A Pap test in females.  Tests for other STDs. TREATMENT Treatment for genital warts may include:  Applying prescription medicines to the warts. These may be solutions or creams.  Freezing the warts with liquid nitrogen (cryotherapy).  Burning the warts with:  Laser treatment.  An electrified probe (electrocautery).  Injecting a substance (Candida antigen or Trichophyton antigen) into the warts to help the body's immune system to fight off the warts.  Interferon injections.  Surgery to remove the warts. HOME CARE INSTRUCTIONS Medicines  Apply over-the-counter and prescription medicines only as told by your health care provider.  Do not treat genital warts with medicines that are used for treating hand warts.  Talk with your health care provider about using over-the-counter anti-itch creams. General Instructions  Do not touch or scratch the warts.  Do not have sex until your treatment has been completed.  Tell your current and past sexual partners about your condition because they may also need treatment.  Keep all follow-up visits as told by your health care provider. This is important.  After treatment, use condoms during sex to prevent future infections. Other Instructions for Women  Women who have genital warts might need increased screening for cervical cancer. This type of cancer is slow growing and can be cured if it is found early. Chances of developing cervical cancer are increased with HPV.  If you become pregnant, tell your health care provider that you have had HPV. Your health care provider will monitor you closely during pregnancy to be sure that your  baby is safe. PREVENTION Talk with your health care provider about getting the HPV vaccines. These vaccines prevent some HPV infections and cancers. It is recommended that the vaccine be given to males and females who are 9-26 years of age. It will not work if you already have HPV, and it is not  recommended for pregnant women. SEEK MEDICAL CARE IF:  You have redness, swelling, or pain in the area of the treated skin.  You have a fever.  You feel generally ill.  You feel lumps in and around your genital area or anal area.  You have bleeding in your genital area or anal area.  You have pain during sexual intercourse.   This information is not intended to replace advice given to you by your health care provider. Make sure you discuss any questions you have with your health care provider.   Document Released: 02/22/2000 Document Revised: 11/15/2014 Document Reviewed: 05/22/2014 Elsevier Interactive Patient Education 2016 [employer]  

## 2014-12-26 ENCOUNTER — Ambulatory Visit: Payer: Managed Care, Other (non HMO) | Admitting: Obstetrics

## 2014-12-28 ENCOUNTER — Ambulatory Visit: Payer: Managed Care, Other (non HMO) | Admitting: Obstetrics

## 2015-01-04 ENCOUNTER — Ambulatory Visit: Payer: Managed Care, Other (non HMO) | Admitting: Obstetrics

## 2015-05-17 ENCOUNTER — Telehealth: Payer: Self-pay | Admitting: *Deleted

## 2015-05-17 NOTE — Telephone Encounter (Signed)
Patient is requesting pain medication for pain management afetr surgery performed by another provider. Patient informed we cannot give her medication for post operative pain if we did not perform the surgery.( per jennifer) Patient hung up - not happy.

## 2015-06-12 ENCOUNTER — Ambulatory Visit (INDEPENDENT_AMBULATORY_CARE_PROVIDER_SITE_OTHER): Payer: Managed Care, Other (non HMO) | Admitting: Obstetrics

## 2015-06-12 DIAGNOSIS — R102 Pelvic and perineal pain: Secondary | ICD-10-CM | POA: Diagnosis not present

## 2015-06-12 DIAGNOSIS — D250 Submucous leiomyoma of uterus: Secondary | ICD-10-CM

## 2015-06-12 MED ORDER — OXYCODONE HCL 10 MG PO TABS: 10.0000 mg | ORAL_TABLET | Freq: Four times a day (QID) | ORAL | Status: DC | PRN

## 2015-06-18 ENCOUNTER — Encounter: Payer: Self-pay | Admitting: Obstetrics

## 2015-06-18 NOTE — Progress Notes (Signed)
Patient ID: Krista Hogan, female   DOB: 1980/12/28, 35 y.o.   MRN: [id number]  Chief Complaint  Patient presents with  . Gynecologic Exam    Patient has laparoscopic surgery to remove firbroid in September. Patient states she is doing fine after suregry- except fpr R sided pain. Patient states it gets so bad that she can't even sleep on that side. Cycle is a little heavier and cramps are worse now.    HPI Krista Hogan is a 35 y.o. female.  Right sided pain after laparoscopic myomectomy.  HPI  Past Medical History  Diagnosis Date  . History of abnormal cervical Pap smear     2011  . History of HPV infection   . Uterine fibroid   . DUB (dysfunctional uterine bleeding)   . Wears glasses     Past Surgical History  Procedure Laterality Date  . Leep  2011  . Tonsillectomy and adenoidectomy  age 60  . Wisdom tooth extraction  2006  . Laparoscopic gelport assisted myomectomy N/A 11/22/2014    Procedure: LAPAROSCOPIC GELPORT ASSISTED MYOMECTOMY WITH CHROMOTUBATION;  Surgeon: Governor Specking, MD;  Location: [place];  Service: Gynecology;  Laterality: N/A;  . Laparoscopy N/A 11/22/2014    Procedure: LAPAROSCOPY DIAGNOSTIC;  Surgeon: Governor Specking, MD;  Location: [facility];  Service: Gynecology;  Laterality: N/A;  . Excision of skin tag  11/22/2014    Procedure: EXCISION OF SKIN TAG VULVA;  Surgeon: Governor Specking, MD;  Location: [place];  Service: Gynecology;;    Family History  Problem Relation Age of Onset  . Fibroids Mother     Social History Social History  Substance Use Topics  . Smoking status: Current Some Day Smoker -- 0.25 packs/day for 10 years    Types: Cigarettes  . Smokeless tobacco: Never Used  . Alcohol Use: 0.0 oz/week    0 Standard drinks or equivalent per week     Comment: SOCIAL    No Known Allergies  Current Outpatient Prescriptions  Medication Sig Dispense Refill  . cetirizine (ZYRTEC) 10 MG  tablet Take 10 mg by mouth daily.    . cholecalciferol (VITAMIN D) 1000 UNITS tablet Take 1,000 Units by mouth daily.    . fish oil-omega-3 fatty acids 1000 MG capsule Take 1 g by mouth daily.    Marland Kitchen ibuprofen (ADVIL,MOTRIN) 800 MG tablet TAKE ONE TABLET BY MOUTH EVERY 8 HOURS AS NEEDED 30 tablet 0  . Multiple Vitamin (MULTIVITAMIN WITH MINERALS) TABS tablet Take 1 tablet by mouth daily.    . Oxycodone HCl 10 MG TABS Take 1 tablet (10 mg total) by mouth every 6 (six) hours as needed. 40 tablet 0   No current facility-administered medications for this visit.    Review of Systems Review of Systems Constitutional: negative for fatigue and weight loss Respiratory: negative for cough and wheezing Cardiovascular: negative for chest pain, fatigue and palpitations Gastrointestinal: negative for abdominal pain and change in bowel habits Genitourinary: positive for right sided pelvic pain Integument/breast: negative for nipple discharge Musculoskeletal:negative for myalgias Neurological: negative for gait problems and tremors Behavioral/Psych: negative for abusive relationship, depression Endocrine: negative for temperature intolerance     There were no vitals taken for this visit.  Physical Exam Physical Exam            General:  Alert and no distress Abdomen:  normal findings: no organomegaly, soft, non-tender and no hernia  Pelvis:  External genitalia: normal general appearance Urinary  system: urethral meatus normal and bladder without fullness, nontender Vaginal: normal without tenderness, induration or masses Cervix: normal appearance Adnexa: normal bimanual exam Uterus: anteverted and tender right fundus, normal size      Data Reviewed Op Note Labs  Assessment     Pelvic pain after laparoscopy. Oxycodone Rx     Plan     F/U 3 months   No orders of the defined types were placed in this encounter.   Meds ordered this encounter  Medications  . Oxycodone HCl 10 MG TABS     Sig: Take 1 tablet (10 mg total) by mouth every 6 (six) hours as needed.    Dispense:  40 tablet    Refill:  0

## 2015-09-05 ENCOUNTER — Ambulatory Visit (INDEPENDENT_AMBULATORY_CARE_PROVIDER_SITE_OTHER): Payer: Managed Care, Other (non HMO) | Admitting: Obstetrics

## 2015-09-05 ENCOUNTER — Encounter: Payer: Self-pay | Admitting: Obstetrics

## 2015-09-05 VITALS — BP 122/86 | HR 73 | Wt 204.0 lb

## 2015-09-05 DIAGNOSIS — R102 Pelvic and perineal pain: Secondary | ICD-10-CM

## 2015-09-05 DIAGNOSIS — D250 Submucous leiomyoma of uterus: Secondary | ICD-10-CM

## 2015-09-05 DIAGNOSIS — Z01419 Encounter for gynecological examination (general) (routine) without abnormal findings: Secondary | ICD-10-CM | POA: Diagnosis not present

## 2015-09-05 DIAGNOSIS — N939 Abnormal uterine and vaginal bleeding, unspecified: Secondary | ICD-10-CM

## 2015-09-05 MED ORDER — OXYCODONE HCL 10 MG PO TABS: 10.0000 mg | ORAL_TABLET | Freq: Four times a day (QID) | ORAL | Status: DC | PRN

## 2015-09-05 NOTE — Progress Notes (Signed)
Subjective:        Krista Hogan is a 35 y.o. female here for a routine exam.  Current complaints: Painful periods with less bleeding and pain than before myomectomy in 9 / 2016.    Personal health questionnaire:  Is patient Ashkenazi Jewish, have a family history of breast and/or ovarian cancer: no Is there a family history of uterine cancer diagnosed at age < 93, gastrointestinal cancer, urinary tract cancer, family member who is a Field seismologist syndrome-associated carrier: no Is the patient overweight and hypertensive, family history of diabetes, personal history of gestational diabetes, preeclampsia or PCOS: no Is patient over 63, have PCOS,  family history of premature CHD under age 34, diabetes, smoke, have hypertension or peripheral artery disease:  no At any time, has a partner hit, kicked or otherwise hurt or frightened you?: no Over the past 2 weeks, have you felt down, depressed or hopeless?: no Over the past 2 weeks, have you felt little interest or pleasure in doing things?:no   Gynecologic History No LMP recorded. Contraception: none Last Pap: 2015. Results were: normal Last mammogram: n/a. Results were: n/a  Obstetric History OB History  Gravida Para Term Preterm AB SAB TAB Ectopic Multiple Living  0 0 0 0 0 0 0 0 0 0         Past Medical History  Diagnosis Date  . History of abnormal cervical Pap smear     2011  . History of HPV infection   . Uterine fibroid   . DUB (dysfunctional uterine bleeding)   . Wears glasses     Past Surgical History  Procedure Laterality Date  . Leep  2011  . Tonsillectomy and adenoidectomy  age 64  . Wisdom tooth extraction  2006  . Laparoscopic gelport assisted myomectomy N/A 11/22/2014    Procedure: LAPAROSCOPIC GELPORT ASSISTED MYOMECTOMY WITH CHROMOTUBATION;  Surgeon: Governor Specking, MD;  Location: [place];  Service: Gynecology;  Laterality: N/A;  . Laparoscopy N/A 11/22/2014    Procedure: LAPAROSCOPY  DIAGNOSTIC;  Surgeon: Governor Specking, MD;  Location: [facility];  Service: Gynecology;  Laterality: N/A;  . Excision of skin tag  11/22/2014    Procedure: EXCISION OF SKIN TAG VULVA;  Surgeon: Governor Specking, MD;  Location: [place];  Service: Gynecology;;     Current outpatient prescriptions:  .  cetirizine (ZYRTEC) 10 MG tablet, Take 10 mg by mouth daily., Disp: , Rfl:  .  cholecalciferol (VITAMIN D) 1000 UNITS tablet, Take 1,000 Units by mouth daily., Disp: , Rfl:  .  fish oil-omega-3 fatty acids 1000 MG capsule, Take 1 g by mouth daily., Disp: , Rfl:  .  ibuprofen (ADVIL,MOTRIN) 800 MG tablet, TAKE ONE TABLET BY MOUTH EVERY 8 HOURS AS NEEDED, Disp: 30 tablet, Rfl: 0 .  Multiple Vitamin (MULTIVITAMIN WITH MINERALS) TABS tablet, Take 1 tablet by mouth daily., Disp: , Rfl:  .  Oxycodone HCl 10 MG TABS, Take 1 tablet (10 mg total) by mouth every 6 (six) hours as needed., Disp: 40 tablet, Rfl: 0 No Known Allergies  Social History  Substance Use Topics  . Smoking status: Current Some Day Smoker -- 0.25 packs/day for 10 years    Types: Cigarettes  . Smokeless tobacco: Never Used  . Alcohol Use: 0.0 oz/week    0 Standard drinks or equivalent per week     Comment: SOCIAL    Family History  Problem Relation Age of Onset  . Fibroids Mother  Review of Systems  Constitutional: negative for fatigue and weight loss Respiratory: negative for cough and wheezing Cardiovascular: negative for chest pain, fatigue and palpitations Gastrointestinal: negative for abdominal pain and change in bowel habits Musculoskeletal:negative for myalgias Neurological: negative for gait problems and tremors Behavioral/Psych: negative for abusive relationship, depression Endocrine: negative for temperature intolerance   Genitourinary:negative for genital lesions, hot flashes, sexual problems and vaginal discharge.  Positive for painful but less heavy  periods Integument/breast: negative for breast lump, breast tenderness, nipple discharge and skin lesion(s)    Objective:       BP 122/86 mmHg  Pulse 73  Wt 204 lb (92.534 kg)           General:  Alert and no distress Abdomen:  normal findings: no organomegaly, soft, non-tender and no hernia  Pelvis:  External genitalia: normal general appearance Urinary system: urethral meatus normal and bladder without fullness, nontender Vaginal: normal without tenderness, induration or masses Cervix: normal appearance Adnexa: normal bimanual exam Uterus: anteverted and non-tender, normal size   Lab Review Urine pregnancy test Labs reviewed yes Radiologic studies reviewed yes    Assessment:    Healthy female exam.   H/O Fibroids.  S/P myomectomy 2016  Plan:    F/U with Dr. Kerin Perna in September 2017  Education reviewed: calcium supplements, low fat, low cholesterol diet, self breast exams and weight bearing exercise. Contraception: none. Follow up in: 1 year.   Meds ordered this encounter  Medications  . Oxycodone HCl 10 MG TABS    Sig: Take 1 tablet (10 mg total) by mouth every 6 (six) hours as needed.    Dispense:  40 tablet    Refill:  0   No orders of the defined types were placed in this encounter.

## 2015-09-07 LAB — PAP IG AND HPV HIGH-RISK
HPV, high-risk: NEGATIVE
PAP Smear Comment: 0

## 2015-09-08 LAB — NUSWAB VG+, CANDIDA 6SP
CANDIDA ALBICANS, NAA: NEGATIVE
CANDIDA PARAPSILOSIS, NAA: NEGATIVE
CANDIDA TROPICALIS, NAA: NEGATIVE
Candida glabrata, NAA: NEGATIVE
Candida krusei, NAA: NEGATIVE
Candida lusitaniae, NAA: NEGATIVE
Chlamydia trachomatis, NAA: NEGATIVE
Neisseria gonorrhoeae, NAA: NEGATIVE
TRICH VAG BY NAA: NEGATIVE

## 2015-10-20 ENCOUNTER — Other Ambulatory Visit: Payer: Self-pay | Admitting: Physician Assistant

## 2015-10-20 ENCOUNTER — Ambulatory Visit (INDEPENDENT_AMBULATORY_CARE_PROVIDER_SITE_OTHER): Payer: Managed Care, Other (non HMO) | Admitting: Physician Assistant

## 2015-10-20 ENCOUNTER — Encounter: Payer: Self-pay | Admitting: Physician Assistant

## 2015-10-20 VITALS — BP 124/76 | HR 65 | Temp 98.2°F | Resp 16 | Ht 63.0 in | Wt 197.0 lb

## 2015-10-20 DIAGNOSIS — K143 Hypertrophy of tongue papillae: Secondary | ICD-10-CM

## 2015-10-20 DIAGNOSIS — Z113 Encounter for screening for infections with a predominantly sexual mode of transmission: Secondary | ICD-10-CM

## 2015-10-20 LAB — TSH: TSH: 0.01 m[IU]/L — AB

## 2015-10-20 LAB — CBC
HCT: 39.3 % (ref 35.0–45.0)
Hemoglobin: 13.5 g/dL (ref 11.7–15.5)
MCH: 30.6 pg (ref 27.0–33.0)
MCHC: 34.4 g/dL (ref 32.0–36.0)
MCV: 89.1 fL (ref 80.0–100.0)
MPV: 8.2 fL (ref 7.5–12.5)
PLATELETS: 317 10*3/uL (ref 140–400)
RBC: 4.41 MIL/uL (ref 3.80–5.10)
RDW: 13.1 % (ref 11.0–15.0)
WBC: 8.2 10*3/uL (ref 3.8–10.8)

## 2015-10-20 NOTE — Progress Notes (Signed)
10/20/2015 9:17 AM   DOB: Jul 30, 1980 / MRN: [id number]  SUBJECTIVE:  Krista Hogan is a 35 y.o. female presenting for a posterior tongue coating.  Reports this started about 1.5 weeks ago.  She was seen about a week ago for same and was starting on diflucan and reports this had no effect.  She denies any pain about the coating and reports it is white.  She has tried scrapping the tissue off and states it won't come off.  She had a new sexual partner and did have oral sex about three weeks ago.  She denies any ejaculate in her mouth.  She has also had vaginal sex with the person and denies any vaginal discharge. She has a history of HPV.  She does smoke but her cigarette use has been minimal.   She has No Known Allergies.   She  has a past medical history of DUB (dysfunctional uterine bleeding); History of abnormal cervical Pap smear; History of HPV infection; Uterine fibroid; and Wears glasses.    She  reports that she has been smoking Cigarettes.  She has a 2.50 pack-year smoking history. She has never used smokeless tobacco. She reports that she drinks alcohol. She reports that she does not use drugs. She  reports that she does not currently engage in sexual activity. The patient  has a past surgical history that includes LEEP (2011); Tonsillectomy and adenoidectomy (age 86); Wisdom tooth extraction (2006); Laparoscopic gelport assisted myomectomy (N/A, 11/22/2014); laparoscopy (N/A, 11/22/2014); and Excision of skin tag (11/22/2014).  Her family history includes Fibroids in her mother.  Review of Systems  Constitutional: Negative for chills, diaphoresis, fever, malaise/fatigue and weight loss.  Gastrointestinal: Negative for abdominal pain, constipation, diarrhea, heartburn and nausea.  Skin: Negative for itching and rash.  Neurological: Negative for dizziness and weakness.    The problem list and medications were reviewed and updated by myself where necessary and exist elsewhere in the  encounter.   OBJECTIVE:  BP 124/76   Pulse 65   Temp 98.2 F (36.8 C) (Oral)   Resp 16   Ht 5\' 3"  (1.6 m)   Wt 197 lb (89.4 kg)   SpO2 100%   BMI 34.90 kg/m   Physical Exam  Constitutional: She is oriented to person, place, and time. She appears well-developed and well-nourished. No distress.  HENT:  Mouth/Throat: Uvula is midline and mucous membranes are normal. No oropharyngeal exudate.    Cardiovascular: Normal rate, regular rhythm and normal heart sounds.   Pulmonary/Chest: Effort normal and breath sounds normal.  Musculoskeletal: Normal range of motion.  Neurological: She is alert and oriented to person, place, and time.  Skin: Skin is warm and dry. She is not diaphoretic.  Psychiatric: She has a normal mood and affect.     No results found for this or any previous visit (from the past 72 hour(s)).  No results found.  ASSESSMENT AND PLAN  [name] was seen today for other.  Diagnoses and all orders for this visit:  Routine screening for STI (sexually transmitted infection) -     Gonococcus culture -     Chlamydia culture -     HIV antibody -     RPR  Tongue coating: Will screen for sti and common causes. She is to stop mouth wash, tongue scrapping, and cigarette use for now as this may be making the irritation worse.  She is very worried this is cancer.  Given her young age and history I do  not think this is a viable possibility however if I can't get an answer I will send her to ENT for further eval.  -     CBC -     TSH    The patient is advised to call or return to clinic if she does not see an improvement in symptoms, or to seek the care of the closest emergency department if she worsens with the above plan.   Philis Fendt, MHS, PA-C [facility] 10/20/2015 9:17 AM

## 2015-10-20 NOTE — Patient Instructions (Addendum)
Please stop using mouth wash and scrapping your tongue for now.  Do not smoke as this may be worsening the irritation.     IF you received an x-ray today, you will receive an invoice from [facility]. Please contact [facility] at [phone] with questions or concerns regarding your invoice.   IF you received labwork today, you will receive an invoice from [employer]. Please contact [facility] at [phone] with questions or concerns regarding your invoice.   Our billing staff will not be able to assist you with questions regarding bills from these companies.  You will be contacted with the lab results as soon as they are available. The fastest way to get your results is to activate your My Chart account. Instructions are located on the last page of this paperwork. If you have not heard from [place] regarding the results in 2 weeks, please contact this office.

## 2015-10-21 LAB — HIV ANTIBODY (ROUTINE TESTING W REFLEX): HIV: NONREACTIVE

## 2015-10-22 LAB — GONOCOCCUS CULTURE

## 2015-10-23 ENCOUNTER — Telehealth: Payer: Self-pay

## 2015-10-23 LAB — RPR

## 2015-10-23 NOTE — Telephone Encounter (Signed)
Legrand Como I know we are waiting on the labs you added on.

## 2015-10-23 NOTE — Telephone Encounter (Signed)
I have called her back. She continues to be anxious. I have tried to reassure her.

## 2015-10-23 NOTE — Telephone Encounter (Signed)
PATIENT STATES SHE WAS IN THE OFFICE ON Saturday AND SAW [name]. SHE WOULD LIKE TO GET HER LAB RESULTS PLEASE. BEST PHONE [phone] (CELL)  PHARMACY CHOICE IS [facility] ON [address].  Krista Hogan

## 2015-10-26 ENCOUNTER — Telehealth: Payer: Self-pay | Admitting: Physician Assistant

## 2015-10-26 DIAGNOSIS — Q383 Other congenital malformations of tongue: Secondary | ICD-10-CM

## 2015-10-26 DIAGNOSIS — E0590 Thyrotoxicosis, unspecified without thyrotoxic crisis or storm: Secondary | ICD-10-CM

## 2015-10-26 LAB — T4, FREE: FREE T4: 2 ng/dL — AB (ref 0.8–1.8)

## 2015-10-26 LAB — T3, FREE: T3 FREE: 5.4 pg/mL — AB (ref 2.3–4.2)

## 2015-10-26 NOTE — Telephone Encounter (Signed)
Please give her a call.  She has hyperthyroidism.  I am sending her to Dr. Loanne Drilling at [facility] for further work up of that.    I continue to await the results of her chlamydia throat swab, however I am going ahead with ENT referral to get their opinion on her tongue.  Please route any questions she has to me.    Philis Fendt, MS, PA-C 8:22 AM, 10/26/2015

## 2015-10-29 LAB — CHLAMYDIA CULTURE

## 2015-11-07 ENCOUNTER — Telehealth: Payer: Self-pay | Admitting: *Deleted

## 2015-11-07 NOTE — Telephone Encounter (Signed)
Patient states having severe pelvic pain and has been since her Myomectomy in 2016.Explained to patient to f/u at emergency care or make an appointment with Dr Kerin Perna who performed her procedure.

## 2015-11-08 NOTE — Telephone Encounter (Signed)
Patient was made aware that she needs to contact Dr Carmela Rima for pain f/u because he did her surgery.

## 2015-11-15 ENCOUNTER — Ambulatory Visit (INDEPENDENT_AMBULATORY_CARE_PROVIDER_SITE_OTHER): Payer: Managed Care, Other (non HMO) | Admitting: Advanced Practice Midwife

## 2015-11-15 ENCOUNTER — Encounter: Payer: Self-pay | Admitting: Advanced Practice Midwife

## 2015-11-15 VITALS — BP 131/81 | HR 80 | Temp 98.6°F | Ht 63.0 in | Wt 196.8 lb

## 2015-11-15 DIAGNOSIS — D250 Submucous leiomyoma of uterus: Secondary | ICD-10-CM

## 2015-11-15 DIAGNOSIS — G8929 Other chronic pain: Secondary | ICD-10-CM | POA: Insufficient documentation

## 2015-11-15 DIAGNOSIS — R102 Pelvic and perineal pain: Principal | ICD-10-CM

## 2015-11-15 DIAGNOSIS — N949 Unspecified condition associated with female genital organs and menstrual cycle: Secondary | ICD-10-CM | POA: Diagnosis not present

## 2015-11-15 MED ORDER — OXYCODONE-ACETAMINOPHEN 5-325 MG PO TABS: 1.0000 | ORAL_TABLET | Freq: Four times a day (QID) | ORAL | 0 refills | Status: DC | PRN

## 2015-11-15 MED ORDER — DICLOFENAC SODIUM 75 MG PO TBEC: 75.0000 mg | DELAYED_RELEASE_TABLET | Freq: Two times a day (BID) | ORAL | 2 refills | Status: DC

## 2015-11-15 NOTE — Progress Notes (Signed)
Subjective:     Krista Hogan is a 35 y.o. female here for a problem Gyn visit. She reports chronic pelvic pain, mostly on the right lower side x 1 year, since her meiomectomy by Dr Junius Finner. She reports her periods are better, with less bleeding and less pain since her surgery, but she now has a constant pain that is sore with some burning that has recently worsened with burning and sharpness with movement. She reports moving some heavy things 2 weeks ago which caused the pain to increase.  Movement, walking, and changing positions in bed make the pain worse.  She is taking ibuprofen 800 mg 2-3 times per day most days and had an Rx for Percocet from Dr Jodi Mourning a few months ago but has not taken any in a long time but desires something stronger so she can sleep.  She denies fever, chills, n/v, or changes to bowel or bladder habits.  She has called Dr Junius Finner related to her pain but no follow up visits r/t to her surgery in 2016 have been scheduled at this time.     Gynecologic History Patient's last menstrual period was 10/28/2015 (exact date). Contraception: abstinence Last Pap: 08/2015 Results were: normal Last mammogram: n/a.   Obstetric History OB History  Gravida Para Term Preterm AB Living  0 0 0 0 0 0  SAB TAB Ectopic Multiple Live Births  0 0 0 0           The following portions of the patient's history were reviewed and updated as appropriate: allergies, current medications, past family history, past medical history, past social history, past surgical history and problem list.  Review of Systems Pertinent items noted in HPI and remainder of comprehensive ROS otherwise negative.    Objective:     BP 131/81   Pulse 80   Temp 98.6 F (37 C) (Oral)   Ht 5\' 3"  (1.6 m)   Wt 196 lb 12.8 oz (89.3 kg)   LMP 10/28/2015 (Exact Date)   BMI 34.86 kg/m  VS reviewed, nursing note reviewed,  Constitutional: well developed, well nourished, no distress HEENT: normocephalic CV:  normal rate Pulm/chest wall: normal effort Abdomen: soft Neuro: alert and oriented x 3 Skin: warm, dry Psych: affect normal  Bimanual exam: Cervix 0/long/high, firm, anterior, neg CMT, uterus nontender, nonenlarged, tenderness in right adnexal area, no palpable mass or enlargement    Assessment:     1. Submucous leiomyoma of uterus --Hx of surgical removal of leiomyoma in 2016 - US Pelvis Complete; Future  2. Chronic pelvic pain in female  - CBC - US Pelvis Complete; Future - US Transvaginal Non-OB; Future - oxyCODONE-acetaminophen (PERCOCET/ROXICET) 5-325 MG tablet; Take 1-2 tablets by mouth every 6 (six) hours as needed.  Dispense: 20 tablet; Refill: 0 - diclofenac (VOLTAREN) 75 MG EC tablet; Take 1 tablet (75 mg total) by mouth 2 (two) times daily with a meal.  Dispense: 60 tablet; Refill: 2     Plan:    CBC today, then outpatient [place] F/U after imaging with Dr Jodi Mourning or Ob/Gyn to discuss options

## 2015-11-16 LAB — CBC
HEMATOCRIT: 41.1 % (ref 34.0–46.6)
HEMOGLOBIN: 13.6 g/dL (ref 11.1–15.9)
MCH: 30.3 pg (ref 26.6–33.0)
MCHC: 33.1 g/dL (ref 31.5–35.7)
MCV: 92 fL (ref 79–97)
Platelets: 360 10*3/uL (ref 150–379)
RBC: 4.49 x10E6/uL (ref 3.77–5.28)
RDW: 14 % (ref 12.3–15.4)
WBC: 8.1 10*3/uL (ref 3.4–10.8)

## 2015-11-23 ENCOUNTER — Encounter: Payer: Self-pay | Admitting: Endocrinology

## 2015-11-23 ENCOUNTER — Ambulatory Visit (INDEPENDENT_AMBULATORY_CARE_PROVIDER_SITE_OTHER): Payer: Commercial Indemnity | Admitting: Endocrinology

## 2015-11-23 DIAGNOSIS — E0590 Thyrotoxicosis, unspecified without thyrotoxic crisis or storm: Secondary | ICD-10-CM | POA: Diagnosis not present

## 2015-11-23 MED ORDER — METHIMAZOLE 10 MG PO TABS: 10.0000 mg | ORAL_TABLET | Freq: Three times a day (TID) | ORAL | 2 refills | Status: DC

## 2015-11-23 NOTE — Progress Notes (Signed)
Subjective:    Patient ID: Krista Hogan, female    DOB: 1980-07-05, 35 y.o.   MRN: [id number]  HPI Pt reports 1 month of slight "buildup" on the tongue, but no assoc pain.  she has no prior h/o thyroid problems.  she has never had XRT to the anterior neck, or thyroid surgery.  she has never had thyroid imaging.  she does not consume kelp or any other prescribed or non-prescribed thyroid medication.  she has never been on amiodarone.   Past Medical History:  Diagnosis Date  . DUB (dysfunctional uterine bleeding)   . History of abnormal cervical Pap smear    2011  . History of HPV infection   . Uterine fibroid   . Wears glasses     Past Surgical History:  Procedure Laterality Date  . EXCISION OF SKIN TAG  11/22/2014   Procedure: EXCISION OF SKIN TAG VULVA;  Surgeon: Governor Specking, MD;  Location: [place];  Service: Gynecology;;  . LAPAROSCOPIC GELPORT ASSISTED MYOMECTOMY N/A 11/22/2014   Procedure: LAPAROSCOPIC GELPORT ASSISTED MYOMECTOMY WITH CHROMOTUBATION;  Surgeon: Governor Specking, MD;  Location: [place];  Service: Gynecology;  Laterality: N/A;  . LAPAROSCOPY N/A 11/22/2014   Procedure: LAPAROSCOPY DIAGNOSTIC;  Surgeon: Governor Specking, MD;  Location: [place];  Service: Gynecology;  Laterality: N/A;  . LEEP  2011  . TONSILLECTOMY AND ADENOIDECTOMY  age 78  . WISDOM TOOTH EXTRACTION  2006    Social History   Social History  . Marital status: Single    Spouse name: N/A  . Number of children: N/A  . Years of education: N/A   Occupational History  . Not on file.   Social History Main Topics  . Smoking status: Current Some Day Smoker    Packs/day: 0.25    Years: 10.00    Types: Cigarettes  . Smokeless tobacco: Never Used  . Alcohol use 0.0 oz/week     Comment: SOCIAL  . Drug use: No  . Sexual activity: Yes    Birth control/ protection: None   Other Topics Concern  . Not on file   Social History Narrative   . No narrative on file    Current Outpatient Prescriptions on File Prior to Visit  Medication Sig Dispense Refill  . diclofenac (VOLTAREN) 75 MG EC tablet Take 1 tablet (75 mg total) by mouth 2 (two) times daily with a meal. 60 tablet 2  . folic acid (FOLVITE) 1 MG tablet Take 1 mg by mouth daily.    . Multiple Vitamin (MULTIVITAMIN WITH MINERALS) TABS tablet Take 1 tablet by mouth daily.    . Omega-3 Fatty Acids (FISH OIL) 1000 MG CAPS Take 1 capsule by mouth daily.    Marland Kitchen oxyCODONE-acetaminophen (PERCOCET/ROXICET) 5-325 MG tablet Take 1-2 tablets by mouth every 6 (six) hours as needed. 20 tablet 0   No current facility-administered medications on file prior to visit.     No Known Allergies  Family History  Problem Relation Age of Onset  . Fibroids Mother   . Thyroid disease Neg Hx     BP 104/62   Pulse 74   Ht 5\' 3"  (1.6 m)   Wt 195 lb (88.5 kg)   LMP 10/28/2015 (Exact Date)   SpO2 98%   BMI 34.54 kg/m   Review of Systems denies headache, hoarseness, visual loss, palpitations, sob, diarrhea, polyuria, muscle weakness, excessive diaphoresis, tremor, anxiety, heat intolerance, easy bruising, and rhinorrhea. She has lost a  few lbs, due to her efforts.       Objective:   Physical Exam VS: see vs page GEN: no distress HEAD: head: no deformity eyes: no periorbital swelling; slight bilat proptosis external nose and ears are normal mouth: no lesion seen NECK: thyroid is approx twice normal size, R>L, no palpable nodule.   CHEST WALL: no deformity.  LUNGS: clear to auscultation.  CV: reg rate and rhythm, no murmur.   ABD: abdomen is soft, nontender.  no hepatosplenomegaly.  not distended.  no hernia MUSCULOSKELETAL: muscle bulk and strength are grossly normal.  no obvious joint swelling.  gait is normal and steady EXTEMITIES: no deformity.  no ulcer on the feet.  feet are of normal color and temp.  no edema PULSES: dorsalis pedis intact bilat.  NEURO:  cn 2-12 grossly  intact.   readily moves all 4's.  sensation is intact to touch on the feet.  No tremor SKIN:  Normal texture and temperature.  No rash or suspicious lesion is visible.  Not diaphoretic.  NODES:  None palpable at the neck.   PSYCH: alert, well-oriented.  Does not appear anxious nor depressed.     Lab Results  Component Value Date   TSH 0.01 (L) 10/20/2015   free T4=2.0  I have reviewed outside records, and summarized: Pt was noted to have thickening of the tongue.  Initial concern was of HPV.  She was found to be hyperthyroid.      Assessment & Plan:  Hyperthyroidism, new, prob due to Grave's Dz.   We discussed rx options.  She chooses tapazole.

## 2015-11-23 NOTE — Patient Instructions (Addendum)
I have sent a prescription to your pharmacy, to slow the thyroid If ever you have fever while taking methimazole, stop it and call us, even if the reason is obvious, because of the risk of a rare side-effect. In view of your medical condition, you should avoid pregnancy until we have decided it is safe Please come back for a follow-up appointment in 1 month.

## 2015-11-27 ENCOUNTER — Ambulatory Visit: Payer: Self-pay | Admitting: Obstetrics

## 2015-11-28 ENCOUNTER — Ambulatory Visit: Payer: Self-pay | Admitting: Obstetrics

## 2015-12-04 ENCOUNTER — Telehealth: Payer: Self-pay | Admitting: Obstetrics

## 2015-12-04 NOTE — Telephone Encounter (Signed)
Left message for patient on cell phone number letting her know that she has an [place] scheduled for Thursday 12/06/2015 at 10:30

## 2015-12-06 ENCOUNTER — Ambulatory Visit (INDEPENDENT_AMBULATORY_CARE_PROVIDER_SITE_OTHER): Payer: Managed Care, Other (non HMO) | Admitting: Obstetrics

## 2015-12-06 ENCOUNTER — Ambulatory Visit (INDEPENDENT_AMBULATORY_CARE_PROVIDER_SITE_OTHER): Payer: Managed Care, Other (non HMO)

## 2015-12-06 ENCOUNTER — Encounter: Payer: Self-pay | Admitting: Obstetrics

## 2015-12-06 VITALS — BP 115/79 | HR 74 | Temp 98.4°F | Wt 196.0 lb

## 2015-12-06 DIAGNOSIS — N946 Dysmenorrhea, unspecified: Secondary | ICD-10-CM | POA: Diagnosis not present

## 2015-12-06 DIAGNOSIS — R102 Pelvic and perineal pain: Principal | ICD-10-CM

## 2015-12-06 DIAGNOSIS — N949 Unspecified condition associated with female genital organs and menstrual cycle: Secondary | ICD-10-CM | POA: Diagnosis not present

## 2015-12-06 DIAGNOSIS — G8929 Other chronic pain: Secondary | ICD-10-CM | POA: Diagnosis not present

## 2015-12-06 DIAGNOSIS — D250 Submucous leiomyoma of uterus: Secondary | ICD-10-CM

## 2015-12-06 MED ORDER — OXYCODONE HCL 10 MG PO TABS: 10.0000 mg | ORAL_TABLET | Freq: Four times a day (QID) | ORAL | 0 refills | Status: DC | PRN

## 2015-12-06 MED ORDER — IBUPROFEN 800 MG PO TABS: 800.0000 mg | ORAL_TABLET | Freq: Three times a day (TID) | ORAL | 5 refills | Status: DC | PRN

## 2015-12-06 NOTE — Progress Notes (Signed)
Patient ID: Krista Hogan, female   DOB: Sep 04, 1980, 35 y.o.   MRN: [id number]  Chief Complaint  Patient presents with  . Advice Only    Patient is in the office to review her [place] results.    HPI Krista Hogan is a 35 y.o. female.  History of chronic pelvic pain.  S/P Diagnostic Laparoscopy with Chromopertubation.  No follow up.  Ultrasound ordered.  Presents today for U/S results. HPI  Past Medical History:  Diagnosis Date  . DUB (dysfunctional uterine bleeding)   . History of abnormal cervical Pap smear    2011  . History of HPV infection   . Uterine fibroid   . Wears glasses     Past Surgical History:  Procedure Laterality Date  . EXCISION OF SKIN TAG  11/22/2014   Procedure: EXCISION OF SKIN TAG VULVA;  Surgeon: Governor Specking, MD;  Location: [place];  Service: Gynecology;;  . LAPAROSCOPIC GELPORT ASSISTED MYOMECTOMY N/A 11/22/2014   Procedure: LAPAROSCOPIC GELPORT ASSISTED MYOMECTOMY WITH CHROMOTUBATION;  Surgeon: Governor Specking, MD;  Location: [place];  Service: Gynecology;  Laterality: N/A;  . LAPAROSCOPY N/A 11/22/2014   Procedure: LAPAROSCOPY DIAGNOSTIC;  Surgeon: Governor Specking, MD;  Location: [place];  Service: Gynecology;  Laterality: N/A;  . LEEP  2011  . TONSILLECTOMY AND ADENOIDECTOMY  age 72  . WISDOM TOOTH EXTRACTION  2006    Family History  Problem Relation Age of Onset  . Fibroids Mother   . Thyroid disease Neg Hx     Social History Social History  Substance Use Topics  . Smoking status: Current Some Day Smoker    Packs/day: 0.25    Years: 10.00    Types: Cigarettes  . Smokeless tobacco: Never Used  . Alcohol use 0.0 oz/week     Comment: SOCIAL    No Known Allergies  Current Outpatient Prescriptions  Medication Sig Dispense Refill  . cholecalciferol (VITAMIN D) 1000 units tablet Take 1,000 Units by mouth daily.    . folic acid (FOLVITE) 1 MG tablet Take 1 mg by mouth daily.    .  Multiple Vitamin (MULTIVITAMIN WITH MINERALS) TABS tablet Take 1 tablet by mouth daily.    . Omega-3 Fatty Acids (FISH OIL) 1000 MG CAPS Take 1 capsule by mouth daily.    Marland Kitchen oxyCODONE-acetaminophen (PERCOCET/ROXICET) 5-325 MG tablet Take 1-2 tablets by mouth every 6 (six) hours as needed. 20 tablet 0  . diclofenac (VOLTAREN) 75 MG EC tablet Take 1 tablet (75 mg total) by mouth 2 (two) times daily with a meal. (Patient not taking: Reported on 12/06/2015) 60 tablet 2  . ibuprofen (ADVIL,MOTRIN) 800 MG tablet Take 1 tablet (800 mg total) by mouth every 8 (eight) hours as needed. 60 tablet 5  . methimazole (TAPAZOLE) 10 MG tablet Take 1 tablet (10 mg total) by mouth 3 (three) times daily. (Patient not taking: Reported on 12/06/2015) 60 tablet 2  . Oxycodone HCl 10 MG TABS Take 1 tablet (10 mg total) by mouth every 6 (six) hours as needed. 40 tablet 0   No current facility-administered medications for this visit.     Review of Systems Review of Systems Constitutional: negative for fatigue and weight loss Respiratory: negative for cough and wheezing Cardiovascular: negative for chest pain, fatigue and palpitations Gastrointestinal: negative for abdominal pain and change in bowel habits Genitourinary:negative Integument/breast: negative for nipple discharge Musculoskeletal:negative for myalgias Neurological: negative for gait problems and tremors Behavioral/Psych: negative for abusive relationship,  depression Endocrine: negative for temperature intolerance     Blood pressure 115/79, pulse 74, temperature 98.4 F (36.9 C), weight 196 lb (88.9 kg), last menstrual period 12/01/2015.  Physical Exam Physical Exam General:   alert  Skin:   no rash or abnormalities  Lungs:   clear to auscultation bilaterally  Heart:   regular rate and rhythm, S1, S2 normal, no murmur, click, rub or gallop  Breasts:   normal without suspicious masses, skin or nipple changes or axillary nodes  Abdomen:  normal  findings: no organomegaly, soft, non-tender and no hernia  Pelvis:  External genitalia: normal general appearance Urinary system: urethral meatus normal and bladder without fullness, nontender Vaginal: normal without tenderness, induration or masses Cervix: normal appearance Adnexa: normal bimanual exam Uterus: anteverted and non-tender, normal size    >50 % of 15 min visit spent on counseling and coordination of care.   Data Reviewed Ultrasound  Assessment     History of chronic pelvic pain Unable to conceive after several years    Plan     Patient will call for follow up appointment with Dr. Kerin Perna.  F/U prn  No orders of the defined types were placed in this encounter.  Meds ordered this encounter  Medications  . ibuprofen (ADVIL,MOTRIN) 800 MG tablet    Sig: Take 1 tablet (800 mg total) by mouth every 8 (eight) hours as needed.    Dispense:  60 tablet    Refill:  5  . DISCONTD: Oxycodone HCl 10 MG TABS    Sig: Take 1 tablet (10 mg total) by mouth every 6 (six) hours as needed.    Dispense:  40 tablet    Refill:  0  . Oxycodone HCl 10 MG TABS    Sig: Take 1 tablet (10 mg total) by mouth every 6 (six) hours as needed.    Dispense:  40 tablet    Refill:  0

## 2015-12-17 DIAGNOSIS — E039 Hypothyroidism, unspecified: Secondary | ICD-10-CM | POA: Insufficient documentation

## 2015-12-17 DIAGNOSIS — K1379 Other lesions of oral mucosa: Secondary | ICD-10-CM

## 2015-12-17 DIAGNOSIS — H9201 Otalgia, right ear: Secondary | ICD-10-CM | POA: Insufficient documentation

## 2015-12-17 HISTORY — DX: Other lesions of oral mucosa: K13.79

## 2015-12-24 ENCOUNTER — Ambulatory Visit: Payer: Commercial Indemnity | Admitting: Endocrinology

## 2016-01-14 ENCOUNTER — Telehealth: Payer: Self-pay | Admitting: *Deleted

## 2016-01-14 ENCOUNTER — Encounter: Payer: Self-pay | Admitting: *Deleted

## 2016-01-14 NOTE — Telephone Encounter (Signed)
Patient is requesting a letter- ? accomodation from her visit in September. Per provider- he does not remember what type of letter she needs. Left message for patient to call with time and number provider can call her.

## 2016-01-21 ENCOUNTER — Other Ambulatory Visit: Payer: Self-pay | Admitting: Obstetrics and Gynecology

## 2016-01-22 ENCOUNTER — Other Ambulatory Visit: Payer: Self-pay | Admitting: Obstetrics

## 2016-01-22 ENCOUNTER — Telehealth: Payer: Self-pay | Admitting: *Deleted

## 2016-01-22 NOTE — Telephone Encounter (Signed)
Pt called to office asking to speak with Dr Jodi Mourning.  Return call to pt. Pt states that she needs a refill on her Boric Acid Rx sent to [facility].  Pt also states that she has seen Dr Carmela Rima as referred. Pt states that she was put back on medication that she had prior to surgery. Pt states that she was not given anything for pain and would like Dr Jethro Bolus opinion of this and/or new referral.  Please send Boric Acid to pharmacy if approved. Please contact pt to discuss Dr Carmela Rima appt or should pt be scheduled here for follow up.

## 2016-01-22 NOTE — Telephone Encounter (Signed)
Boric Acid 600mg  vaginal suppositories called in to [place]

## 2016-01-28 ENCOUNTER — Other Ambulatory Visit: Payer: Self-pay | Admitting: Obstetrics and Gynecology

## 2016-01-28 DIAGNOSIS — R102 Pelvic and perineal pain: Secondary | ICD-10-CM

## 2016-01-28 DIAGNOSIS — R109 Unspecified abdominal pain: Secondary | ICD-10-CM

## 2016-01-30 ENCOUNTER — Other Ambulatory Visit: Payer: Self-pay | Admitting: Obstetrics and Gynecology

## 2016-01-30 DIAGNOSIS — R109 Unspecified abdominal pain: Secondary | ICD-10-CM

## 2016-01-30 DIAGNOSIS — R102 Pelvic and perineal pain: Secondary | ICD-10-CM

## 2016-02-13 ENCOUNTER — Ambulatory Visit
Admission: RE | Admit: 2016-02-13 | Discharge: 2016-02-13 | Disposition: A | Discharge status: Home / Self Care | Payer: Commercial Indemnity | Source: Ambulatory Visit | Attending: Obstetrics and Gynecology | Admitting: Obstetrics and Gynecology

## 2016-02-13 DIAGNOSIS — R109 Unspecified abdominal pain: Secondary | ICD-10-CM

## 2016-02-13 DIAGNOSIS — R102 Pelvic and perineal pain: Secondary | ICD-10-CM

## 2016-02-13 MED ORDER — IOPAMIDOL (ISOVUE-300) INJECTION 61%
100.0000 mL | Freq: Once | INTRAVENOUS | Status: AC | PRN
  Administered 2016-02-13: 100 mL via INTRAVENOUS

## 2016-06-08 DIAGNOSIS — E0500 Thyrotoxicosis with diffuse goiter without thyrotoxic crisis or storm: Secondary | ICD-10-CM

## 2016-06-08 HISTORY — DX: Thyrotoxicosis with diffuse goiter without thyrotoxic crisis or storm: E05.00

## 2016-07-28 DIAGNOSIS — N83209 Unspecified ovarian cyst, unspecified side: Secondary | ICD-10-CM

## 2016-07-28 DIAGNOSIS — B977 Papillomavirus as the cause of diseases classified elsewhere: Secondary | ICD-10-CM | POA: Insufficient documentation

## 2016-07-28 DIAGNOSIS — A630 Anogenital (venereal) warts: Secondary | ICD-10-CM | POA: Insufficient documentation

## 2016-07-28 DIAGNOSIS — G43909 Migraine, unspecified, not intractable, without status migrainosus: Secondary | ICD-10-CM | POA: Insufficient documentation

## 2016-07-28 HISTORY — DX: Papillomavirus as the cause of diseases classified elsewhere: B97.7

## 2016-07-28 HISTORY — DX: Unspecified ovarian cyst, unspecified side: N83.209

## 2016-07-29 DIAGNOSIS — A6000 Herpesviral infection of urogenital system, unspecified: Secondary | ICD-10-CM | POA: Insufficient documentation

## 2016-07-29 HISTORY — DX: Herpesviral infection of urogenital system, unspecified: A60.00

## 2016-09-05 ENCOUNTER — Ambulatory Visit: Payer: Managed Care, Other (non HMO) | Admitting: Obstetrics

## 2016-09-09 ENCOUNTER — Telehealth: Payer: Self-pay | Admitting: General Practice

## 2016-09-09 NOTE — Telephone Encounter (Signed)
Krista Hogan w/ [facility] checking on status of referral faxed last week 06/28. She would like a call back to confirm receipt.  Thank you,  -LL

## 2016-09-16 ENCOUNTER — Telehealth: Payer: Self-pay | Admitting: General Practice

## 2016-09-16 NOTE — Telephone Encounter (Signed)
Please verify that you have received new patient referral from [facility] and notify tarrah

## 2016-09-17 ENCOUNTER — Ambulatory Visit: Payer: Self-pay | Admitting: Obstetrics

## 2016-09-22 ENCOUNTER — Ambulatory Visit: Payer: Managed Care, Other (non HMO) | Admitting: Obstetrics

## 2016-09-23 ENCOUNTER — Encounter: Payer: Self-pay | Admitting: Internal Medicine

## 2016-09-23 ENCOUNTER — Ambulatory Visit (INDEPENDENT_AMBULATORY_CARE_PROVIDER_SITE_OTHER): Payer: Managed Care, Other (non HMO) | Admitting: Internal Medicine

## 2016-09-23 VITALS — BP 120/74 | HR 76 | Ht 64.0 in | Wt 199.0 lb

## 2016-09-23 DIAGNOSIS — E0590 Thyrotoxicosis, unspecified without thyrotoxic crisis or storm: Secondary | ICD-10-CM

## 2016-09-23 LAB — T4, FREE: Free T4: 1.05 ng/dL (ref 0.60–1.60)

## 2016-09-23 LAB — TSH: TSH: 0 u[IU]/mL — ABNORMAL LOW (ref 0.35–4.50)

## 2016-09-23 LAB — T3, FREE: T3, Free: 4.1 pg/mL (ref 2.3–4.2)

## 2016-09-23 NOTE — Progress Notes (Signed)
Patient ID: Krista Hogan, female   DOB: May 27, 1980, 36 y.o.   MRN: [id number]    HPI  Krista Hogan is a 36 y.o.-year-old female, referred by her PCP, Dr. Maia Petties, for evaluation for thyrotoxicosis. She previously saw Dr. Loanne Drilling, last visit 11/2015.   She was dx'ed with thyrotoxicosis in Summer 2017. She was Rx'ed MMI >> did not start as she was afraid of getting weight.  She has been trying to get pregnant, and sees Dr. Kerin Perna.  I reviewed pt's thyroid tests: 08/2016: TSH 0.011 (received after appt) Lab Results  Component Value Date   TSH 0.01 (L) 10/20/2015   FREET4 2.0 (H) 10/20/2015    Of note, CBC was normal in 08/2016 (will scan report).  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 ~ 15 lb weight loss - no hair loss  Pt does have a FH of thyroid ds. In cousin  No FH of thyroid cancer. No h/o radiation tx to head or neck.  No seaweed or kelp, no recent contrast studies. No steroid use. No herbal supplements. No Biotin use. On folic acid, vitamin D.  Pt. also has a history of myomectomy.  ROS: Constitutional: + weight gain, + fatigue, no subjective hyperthermia/hypothermia, + poor sleep Eyes: no blurry vision, no xerophthalmia ENT: no sore throat, no nodules palpated in throat, no dysphagia/odynophagia, no hoarseness Cardiovascular: no CP/SOB/palpitations/leg swelling Respiratory: no cough/SOB Gastrointestinal: no N/V/D/+ C Musculoskeletal: no muscle/joint aches Skin: no rashes Neurological: no tremors/numbness/tingling/dizziness Psychiatric: no depression/anxiety  Past Medical History:  Diagnosis Date  . DUB (dysfunctional uterine bleeding)   . History of abnormal  cervical Pap smear    2011  . History of HPV infection   . Uterine fibroid   . Wears glasses    Past Surgical History:  Procedure Laterality Date  . EXCISION OF SKIN TAG  11/22/2014   Procedure: EXCISION OF SKIN TAG VULVA;  Surgeon: Governor Specking, MD;  Location: [place];  Service: Gynecology;;  . LAPAROSCOPIC GELPORT ASSISTED MYOMECTOMY N/A 11/22/2014   Procedure: LAPAROSCOPIC GELPORT ASSISTED MYOMECTOMY WITH CHROMOTUBATION;  Surgeon: Governor Specking, MD;  Location: [place];  Service: Gynecology;  Laterality: N/A;  . LAPAROSCOPY N/A 11/22/2014   Procedure: LAPAROSCOPY DIAGNOSTIC;  Surgeon: Governor Specking, MD;  Location: [place];  Service: Gynecology;  Laterality: N/A;  . LEEP  2011  . TONSILLECTOMY AND ADENOIDECTOMY  age 40  . WISDOM TOOTH EXTRACTION  2006   Social History   Social History  . Marital status: Single    Spouse name: N/A  . Number of children: 0   Occupational History  . Lab tech   Social History Main Topics  . Smoking status: Current Some Day Smoker    Packs/day: 0.25    Years: 10.00    Types: Cigarettes  . Smokeless tobacco: Never Used  . Alcohol use 0.0 oz/week     Comment: SOCIAL  . Drug use: No  . Sexual activity: Yes    Birth control/ protection: None   Current Outpatient Prescriptions on File Prior to Visit  Medication Sig Dispense Refill  . cholecalciferol (VITAMIN D) 1000 units tablet Take 1,000 Units by mouth daily.    Marland Kitchen  folic acid (FOLVITE) 1 MG tablet Take 1 mg by mouth daily.    Marland Kitchen ibuprofen (ADVIL,MOTRIN) 800 MG tablet Take 1 tablet (800 mg total) by mouth every 8 (eight) hours as needed. 60 tablet 5  . Multiple Vitamin (MULTIVITAMIN WITH MINERALS) TABS tablet Take 1 tablet by mouth daily.    . Omega-3 Fatty Acids (FISH OIL) 1000 MG CAPS Take 1 capsule by mouth daily.    . Oxycodone HCl 10 MG TABS Take 1 tablet (10 mg total) by mouth every 6 (six) hours as needed. 40 tablet 0  .  diclofenac (VOLTAREN) 75 MG EC tablet Take 1 tablet (75 mg total) by mouth 2 (two) times daily with a meal. (Patient not taking: Reported on 12/06/2015) 60 tablet 2  . methimazole (TAPAZOLE) 10 MG tablet Take 1 tablet (10 mg total) by mouth 3 (three) times daily. (Patient not taking: Reported on 12/06/2015) 60 tablet 2   No current facility-administered medications on file prior to visit.    No Known Allergies Family History  Problem Relation Age of Onset  . Fibroids Mother   . Thyroid disease Neg Hx    PE: BP 120/74 (BP Location: Left Arm, Patient Position: Sitting)   Pulse 76   Ht 5\' 4"  (1.626 m)   Wt 199 lb (90.3 kg)   LMP 08/17/2016   SpO2 98%   BMI 34.16 kg/m  Wt Readings from Last 3 Encounters:  09/23/16 199 lb (90.3 kg)  12/06/15 196 lb (88.9 kg)  11/23/15 195 lb (88.5 kg)   Constitutional: obese, in NAD Eyes: PERRLA, EOMI, no exophthalmos, no lid lag, no stare ENT: moist mucous membranes, + mild thyromegaly, no thyroid bruits, no cervical lymphadenopathy Cardiovascular: RRR, No MRG Respiratory: CTA B Gastrointestinal: abdomen soft, NT, ND, BS+ Musculoskeletal: no deformities, strength intact in all 4 Skin: moist, warm, no rashes Neurological: no tremor with outstretched hands, DTR normal in all 4  ASSESSMENT: 1. Thyrotoxicosis  PLAN:  1. Patient with a h/o thyrotoxicosis for at least 1 year, noncompliant with the recommended tx (MMI) by Dr Loanne Drilling 1 year ago. She was recently told her thyroid tests are still abnormal (I received the TSH level after pt left and this was supressed) but she does not have any sxs of thyrotoxicosis: no recent weight loss, heat intolerance, hyperdefecation, palpitations, anxiety.  - we discussed about the short and long term consequences of uncontrolled thyrotoxicosis >> she understands the risks - she does not appear to have exogenous causes for the low TSH.  - We discussed that possible causes of thyrotoxicosis are:  Graves ds    Thyroiditis toxic multinodular goiter/ toxic adenoma (I cannot feel nodules at palpation of her thyroid). - I suggested that we check the TSH, fT3 and fT4 and also add thyroid stimulating antibodies to screen for Graves' disease.  - If the tests remain abnormal, we may need an uptake and scan to differentiate between the 3 above possible etiologies  - we discussed about possible modalities of treatment for the above conditions, to include methimazole use (or PTU since she is trying to get pregnant), radioactive iodine ablation (C-I if she is trying to get pregnant), or (last resort) surgery. - we might need to do thyroid ultrasound depending on the results of the uptake and scan (if a cold nodule is present) - I do not feel that we need to add beta blockers at this time, since she is not tachycardic, anxious, or tremulous - I advised her to join my  chart to communicate easier - RTC in 3 months, but likely sooner for repeat labs Office Visit on 09/23/2016  Component Date Value Ref Range Status  . TSH 09/23/2016 0.00* 0.35 - 4.50 uIU/mL Final  . Free T4 09/23/2016 1.05  0.60 - 1.60 ng/dL Final   Comment: Specimens from patients who are undergoing biotin therapy and /or ingesting biotin supplements may contain high levels of biotin.  The higher biotin concentration in these specimens interferes with this Free T4 assay.  Specimens that contain high levels  of biotin may cause false high results for this Free T4 assay.  Please interpret results in light of the total clinical presentation of the patient.    . T3, Free 09/23/2016 4.1  2.3 - 4.2 pg/mL Final  . 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 No absolute need for Uptake and scan.  Tests are better and she is asymptomatic, but since she is contemplating pregnancy, will start a low dose MMI: 2.5 mg daily and recheck TFTs in 1.5 mo. Will advise her to let me know if she gets  pregnant, in this case, we need to switch to PTU.  Philemon Kingdom, MD PhD [facility] Endocrinology

## 2016-09-23 NOTE — Patient Instructions (Signed)
Please stop at the lab.  Please come back for a follow-up appointment in 3 months.   Hyperthyroidism Hyperthyroidism is when the thyroid is too active (overactive). Your thyroid is a large gland that is located in your neck. The thyroid helps to control how your body uses food (metabolism). When your thyroid is overactive, it produces too much of a hormone called thyroxine. What are the causes? Causes of hyperthyroidism may include:  Graves disease. This is when your immune system attacks the thyroid gland. This is the most common cause.  Inflammation of the thyroid gland.  Tumor in the thyroid gland or somewhere else.  Excessive use of thyroid medicines, including: ? Prescription thyroid supplement. ? Herbal supplements that mimic thyroid hormones.  Solid or fluid-filled lumps within your thyroid gland (thyroid nodules).  Excessive ingestion of iodine.  What increases the risk?  Being female.  Having a family history of thyroid conditions. What are the signs or symptoms? Signs and symptoms of hyperthyroidism may include:  Nervousness.  Inability to tolerate heat.  Unexplained weight loss.  Diarrhea.  Change in the texture of hair or skin.  Heart skipping beats or making extra beats.  Rapid heart rate.  Loss of menstruation.  Shaky hands.  Fatigue.  Restlessness.  Increased appetite.  Sleep problems.  Enlarged thyroid gland or nodules.  How is this diagnosed? Diagnosis of hyperthyroidism may include:  Medical history and physical exam.  Blood tests.  Ultrasound tests.  How is this treated? Treatment may include:  Medicines to control your thyroid.  Surgery to remove your thyroid.  Radiation therapy.  Follow these instructions at home:  Take medicines only as directed by your health care provider.  Do not use any tobacco products, including cigarettes, chewing tobacco, or electronic cigarettes. If you need help quitting, ask your health  care provider.  Do not exercise or do physical activity until your health care provider approves.  Keep all follow-up appointments as directed by your health care provider. This is important. Contact a health care provider if:  Your symptoms do not get better with treatment.  You have fever.  You are taking thyroid replacement medicine and you: ? Have depression. ? Feel mentally and physically slow. ? Have weight gain. Get help right away if:  You have decreased alertness or a change in your awareness.  You have abdominal pain.  You feel dizzy.  You have a rapid heartbeat.  You have an irregular heartbeat. This information is not intended to replace advice given to you by your health care provider. Make sure you discuss any questions you have with your health care provider. Document Released: 02/24/2005 Document Revised: 07/26/2015 Document Reviewed: 07/12/2013 Elsevier Interactive Patient Education  2017 [employer].

## 2016-09-30 LAB — THYROID STIMULATING IMMUNOGLOBULIN: TSI: 273 % baseline — ABNORMAL HIGH (ref <140)

## 2016-10-01 ENCOUNTER — Telehealth: Payer: Self-pay

## 2016-10-01 DIAGNOSIS — E0500 Thyrotoxicosis with diffuse goiter without thyrotoxic crisis or storm: Secondary | ICD-10-CM | POA: Insufficient documentation

## 2016-10-01 MED ORDER — METHIMAZOLE 5 MG PO TABS: 2.5000 mg | ORAL_TABLET | Freq: Every day | ORAL | 2 refills | Status: DC

## 2016-10-01 MED ORDER — METHIMAZOLE 5 MG PO TABS
2.5000 mg | ORAL_TABLET | Freq: Every day | ORAL | 2 refills | Status: DC
Start: 2016-10-01 — End: 2017-01-01

## 2016-10-01 NOTE — Telephone Encounter (Signed)
Called patient and gave lab results. Patient had no questions or concerns.  

## 2016-10-01 NOTE — Telephone Encounter (Signed)
-----   Message from Philemon Kingdom, MD sent at 10/01/2016  1:49 PM EDT ----- Almyra Free, can you please call pt: TFTs improved, but TSH is still low, at 0.0. 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 No absolute need to do theThyroid Uptake and Scan for now.  Since she is contemplating pregnancy, I would suggest to start a very low dose MMI: 2.5 mg daily and recheck TFTs in 1.5 mo. Pleasel advise her to let me know if she gets pregnant, in this case, we need to switch to PTU. Labs are in. MMI is in but not sent to pharm yet.

## 2016-10-20 ENCOUNTER — Telehealth: Payer: Self-pay | Admitting: Internal Medicine

## 2016-10-20 NOTE — Telephone Encounter (Signed)
Patient called to ask if it was safe to take "rapid tone" supplements with her other meds.   Ginseng, Garcinia Cambogia, and Forskolin are three prominent ingredients of Rapid Tone formula   Patient is available at her mobile

## 2016-10-21 ENCOUNTER — Telehealth: Payer: Self-pay

## 2016-10-21 NOTE — Telephone Encounter (Signed)
Called patient and advised of MD note. Patient had no questions at this time.

## 2016-10-21 NOTE — Telephone Encounter (Signed)
I would not suggest to use weight loss meds during tx of her hyperthyroidism.

## 2016-10-21 NOTE — Telephone Encounter (Signed)
Please advise. Thank you

## 2016-10-27 ENCOUNTER — Ambulatory Visit: Payer: Managed Care, Other (non HMO) | Admitting: Obstetrics

## 2016-10-30 ENCOUNTER — Telehealth: Payer: Self-pay | Admitting: Internal Medicine

## 2016-10-30 NOTE — Telephone Encounter (Signed)
Patient called stating that the methimazole (TAPAZOLE) 5 MG tablet gives her horrible migraines. Call patient to advise as soon as possible.

## 2016-10-31 ENCOUNTER — Telehealth: Payer: Self-pay

## 2016-10-31 NOTE — Telephone Encounter (Signed)
Please advise. Thank you

## 2016-10-31 NOTE — Telephone Encounter (Signed)
It is very unusual for this extremely low dose to cause such pbs! Was she taking the 2.5 mg MMI (methimazole) every day or did she skip doses? If so, let's have her back for labs - labs are in - and ask her to stop MMI right after labs. If we need to continue treatment, we can switch to a low dose PTU (another medicine for hyperthyroidism).

## 2016-10-31 NOTE — Telephone Encounter (Signed)
Called patient and advised of MD note. Gave call back number for patient to call back to discuss.

## 2016-11-03 ENCOUNTER — Ambulatory Visit: Payer: Managed Care, Other (non HMO) | Admitting: Obstetrics and Gynecology

## 2016-11-04 ENCOUNTER — Other Ambulatory Visit: Payer: Managed Care, Other (non HMO)

## 2016-11-17 ENCOUNTER — Encounter: Payer: Self-pay | Admitting: Obstetrics and Gynecology

## 2016-11-17 ENCOUNTER — Ambulatory Visit (INDEPENDENT_AMBULATORY_CARE_PROVIDER_SITE_OTHER): Payer: Managed Care, Other (non HMO) | Admitting: Obstetrics and Gynecology

## 2016-11-17 VITALS — BP 128/96 | HR 70 | Wt 201.7 lb

## 2016-11-17 DIAGNOSIS — A630 Anogenital (venereal) warts: Secondary | ICD-10-CM | POA: Diagnosis not present

## 2016-11-17 MED ORDER — LIDOCAINE 5 % EX OINT: 1.0000 "application " | TOPICAL_OINTMENT | CUTANEOUS | 0 refills | Status: DC | PRN

## 2016-11-17 MED ORDER — IMIQUIMOD 5 % EX CREA: TOPICAL_CREAM | CUTANEOUS | 0 refills | Status: DC

## 2016-11-17 NOTE — Progress Notes (Signed)
36 yo G0 here to discuss medical management of HPV related genital warts. She desires TCA treatment today to have them removed immediately.  Past Medical History:  Diagnosis Date  . DUB (dysfunctional uterine bleeding)   . History of abnormal cervical Pap smear    2011  . History of HPV infection   . Uterine fibroid   . Wears glasses    Past Surgical History:  Procedure Laterality Date  . EXCISION OF SKIN TAG  11/22/2014   Procedure: EXCISION OF SKIN TAG VULVA;  Surgeon: Governor Specking, MD;  Location: [place];  Service: Gynecology;;  . LAPAROSCOPIC GELPORT ASSISTED MYOMECTOMY N/A 11/22/2014   Procedure: LAPAROSCOPIC GELPORT ASSISTED MYOMECTOMY WITH CHROMOTUBATION;  Surgeon: Governor Specking, MD;  Location: [place];  Service: Gynecology;  Laterality: N/A;  . LAPAROSCOPY N/A 11/22/2014   Procedure: LAPAROSCOPY DIAGNOSTIC;  Surgeon: Governor Specking, MD;  Location: [place];  Service: Gynecology;  Laterality: N/A;  . LEEP  2011  . TONSILLECTOMY AND ADENOIDECTOMY  age 62  . WISDOM TOOTH EXTRACTION  2006   Family History  Problem Relation Age of Onset  . Fibroids Mother   . Thyroid disease Neg Hx    Social History  Substance Use Topics  . Smoking status: Current Some Day Smoker    Packs/day: 0.25    Years: 10.00    Types: Cigarettes  . Smokeless tobacco: Never Used  . Alcohol use 0.0 oz/week     Comment: SOCIAL   ROS See pertinent in HPI  Blood pressure (!) 128/96, pulse 70, weight 201 lb 11.2 oz (91.5 kg), last menstrual period 10/19/2016. GENERAL: Well-developed, well-nourished female in no acute distress.  ABDOMEN: Soft, nontender, nondistended.  PELVIC: Normal external female genitalia with 3 3-mm warts localized on the left labia majora. Vagina is pink and rugated.  Normal discharge. Normal appearing cervix. Uterus is normal in size. No adnexal mass or tenderness. EXTREMITIES: No cyanosis, clubbing, or edema, 2+ distal  pulses.  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 Good blanching obtained - Rx Aldara cream provided as well as lidocaine ointment for pain relief - Patient signed release to review pap smear records from [place] prn

## 2016-12-24 ENCOUNTER — Ambulatory Visit: Payer: Managed Care, Other (non HMO) | Admitting: Internal Medicine

## 2016-12-25 ENCOUNTER — Ambulatory Visit (INDEPENDENT_AMBULATORY_CARE_PROVIDER_SITE_OTHER): Payer: Managed Care, Other (non HMO) | Admitting: Internal Medicine

## 2016-12-25 ENCOUNTER — Telehealth: Payer: Self-pay | Admitting: Internal Medicine

## 2016-12-25 ENCOUNTER — Encounter: Payer: Self-pay | Admitting: Internal Medicine

## 2016-12-25 VITALS — BP 118/88 | HR 68 | Wt 207.0 lb

## 2016-12-25 DIAGNOSIS — E0500 Thyrotoxicosis with diffuse goiter without thyrotoxic crisis or storm: Secondary | ICD-10-CM

## 2016-12-25 DIAGNOSIS — E0590 Thyrotoxicosis, unspecified without thyrotoxic crisis or storm: Secondary | ICD-10-CM

## 2016-12-25 NOTE — Progress Notes (Addendum)
Patient ID: Krista Hogan, female   DOB: Jul 02, 1980, 36 y.o.   MRN: [id number]    HPI  Krista Hogan is a 36 y.o.-year-old female, returning for f/u for Graves ds. She previously saw Dr. Loanne Drilling, last visit 11/2015. Last visit with me 3 mo ago.  She continues to see Dr. Kerin Perna as she is trying to get pregnant. Plan to check fallopian tubes soon. Menses occur monthly.  Reviewed and addended hx: She was dx'ed with thyrotoxicosis in Summer 2017. She was Rx'ed MMI >> initially refused 2/2 concern for poss. weight gain  At last visit, labs confirmed Graves' disease so we started a low-dose methimazole, 2.5 mg daily. She developed headaches but now believes they may have been related to allergies >> continues the same dose >> no SEs.  I reviewed pt's thyroid tests: Lab Results  Component Value Date   TSH 0.00 (L) 09/23/2016   TSH 0.01 (L) 10/20/2015   FREET4 1.05 09/23/2016   FREET4 2.0 (H) 10/20/2015  08/2016: TSH 0.011 (received after appt)  Graves Ab's were elevated >> dx of Graves ds: Lab Results  Component Value Date   TSI 273 (H) 09/23/2016   Of note, CBC was normal in 08/2016.  At last visit, she c/o - fatigue >> started B12 and yesterday started B complex - no heat intolerance, + cold intolerance - constipation  - sees Dr Collene Mares for an anal fissure - weight gain, but prev.. ~ 15 lb weight loss  Pt denies: - feeling nodules in neck - hoarseness - dysphagia - choking - SOB with lying down  Pt does have a FH of thyroid ds. In cousin  No FH of thyroid cancer. No h/o radiation tx to head or neck.  No seaweed or kelp. No recent contrast studies. No herbal supplements. ? Biotin use (just started B complex yesterday - will check at home if has Biotin). No recent steroids use.   Pt. also has a history of myomectomy.  ROS: Constitutional: no weight gain/no weight loss, no fatigue, no subjective hyperthermia, no subjective hypothermia Eyes: no blurry vision, no  xerophthalmia ENT: no sore throat, + see HPI Cardiovascular: no CP/no SOB/no palpitations/no leg swelling Respiratory: no cough/no SOB/no wheezing Gastrointestinal: no N/no V/no D/no C/no acid reflux Musculoskeletal: no muscle aches/no joint aches Skin: no rashes, no hair loss Neurological: no tremors/no numbness/no tingling/no dizziness  I reviewed pt's medications, allergies, PMH, social hx, family hx, and changes were documented in the history of present illness. Otherwise, unchanged from my initial visit note.  Past Medical History:  Diagnosis Date  . DUB (dysfunctional uterine bleeding)   . History of abnormal cervical Pap smear    2011  . History of HPV infection   . Uterine fibroid   . Wears glasses    Past Surgical History:  Procedure Laterality Date  . EXCISION OF SKIN TAG  11/22/2014   Procedure: EXCISION OF SKIN TAG VULVA;  Surgeon: Governor Specking, MD;  Location: [place];  Service: Gynecology;;  . LAPAROSCOPIC GELPORT ASSISTED MYOMECTOMY N/A 11/22/2014   Procedure: LAPAROSCOPIC GELPORT ASSISTED MYOMECTOMY WITH CHROMOTUBATION;  Surgeon: Governor Specking, MD;  Location: [place];  Service: Gynecology;  Laterality: N/A;  . LAPAROSCOPY N/A 11/22/2014   Procedure: LAPAROSCOPY DIAGNOSTIC;  Surgeon: Governor Specking, MD;  Location: [place];  Service: Gynecology;  Laterality: N/A;  . LEEP  2011  . TONSILLECTOMY AND ADENOIDECTOMY  age 36  . [place] EXTRACTION  2006  Social History   Social History  . Marital status: Single    Spouse name: N/A  . Number of children: 0   Occupational History  . Lab tech   Social History Main Topics  . Smoking status: Current Some Day Smoker    Packs/day: 0.25    Years: 10.00    Types: Cigarettes  . Smokeless tobacco: Never Used  . Alcohol use 0.0 oz/week     Comment: SOCIAL  . Drug use: No  . Sexual activity: Yes    Birth control/ protection: None   Current Outpatient  Prescriptions on File Prior to Visit  Medication Sig Dispense Refill  . cholecalciferol (VITAMIN D) 1000 units tablet Take 1,000 Units by mouth daily.    . diclofenac (VOLTAREN) 75 MG EC tablet Take 1 tablet (75 mg total) by mouth 2 (two) times daily with a meal. (Patient not taking: Reported on 12/06/2015) 60 tablet 2  . folic acid (FOLVITE) 1 MG tablet Take 1 mg by mouth daily.    Marland Kitchen ibuprofen (ADVIL,MOTRIN) 800 MG tablet Take 1 tablet (800 mg total) by mouth every 8 (eight) hours as needed. 60 tablet 5  . imiquimod (ALDARA) 5 % cream Apply topically 3 (three) times a week. 12 each 0  . lidocaine (XYLOCAINE) 5 % ointment Apply 1 application topically as needed. 35.44 g 0  . methimazole (TAPAZOLE) 5 MG tablet Take 0.5 tablets (2.5 mg total) by mouth daily. 30 tablet 2  . Multiple Vitamin (MULTIVITAMIN WITH MINERALS) TABS tablet Take 1 tablet by mouth daily.    . Omega-3 Fatty Acids (FISH OIL) 1000 MG CAPS Take 1 capsule by mouth daily.    . Oxycodone HCl 10 MG TABS Take 1 tablet (10 mg total) by mouth every 6 (six) hours as needed. (Patient not taking: Reported on 11/17/2016) 40 tablet 0   No current facility-administered medications on file prior to visit.    No Known Allergies Family History  Problem Relation Age of Onset  . Fibroids Mother   . Thyroid disease Neg Hx    PE: BP 118/88 (BP Location: Left Arm, Patient Position: Sitting, Cuff Size: Normal)   Pulse 68   Wt 207 lb (93.9 kg)   SpO2 97%   BMI 35.53 kg/m  Wt Readings from Last 3 Encounters:  12/25/16 207 lb (93.9 kg)  11/17/16 201 lb 11.2 oz (91.5 kg)  09/23/16 199 lb (90.3 kg)   Constitutional: overweight, in NAD Eyes: PERRLA, EOMI, no exophthalmos, no lid lag, no stare ENT: moist mucous membranes, + mild thyromegaly, no cervical lymphadenopathy Cardiovascular: RRR, No MRG Respiratory: CTA B Gastrointestinal: abdomen soft, NT, ND, BS+ Musculoskeletal: no deformities, strength intact in all 4 Skin: moist, warm, no  rashes Neurological: no tremor with outstretched hands, DTR normal in all 4  ASSESSMENT: 1. Graves ds  PLAN:  1. Patient with a history of thyrotoxicosis for more than 1 year, previously seen by Dr. Loanne Drilling and suggested methimazole. She did not start this due to concern for weight gain. I saw the patient first 3 months ago and we started a very low dose of methimazole after we confirmed Graves' disease by elevated TSI antibodies. We did not proceed with thyroid uptake and scan for this reason. Of note, she was asymptomatic at that time, without recent weight loss, heat intolerance, hyper defecation, palpitations, anxiety. However, she was contemplating a pregnancy and we discussed that her thyroid tests need to be in the normal range before getting pregnant. She did start  the methimazole 2.5 mg daily but developed a headache. She tells me this may have been 2/2 allergies as it has now resolved. - at last visit,we discussed about possible modalities of treatment for the above conditions, to include methimazole use (or PTU since she is trying to get pregnant), radioactive iodine ablation (C-I if she is trying to get pregnant), or (last resort) surgery.  At this visit, we discussed that since she is trying to get pregnant and was intolerant of methimazole, we can try PTU at a low dose, 50 mg daily. However, I would like to recheck her tests and see if she absolutely needs this. We cannot check today as she just started B vitamins >> will return in few days - I do not feel the need to add beta blockers, since she is not tachycardic or tremulous - I again advised her to join my chart for easier communication - RTC in 6 months, but likely sooner for repeat labs  - time spent with the patient: 25 min, of which >50% was spent in obtaining information about her symptoms, reviewing her previous labs, evaluations, and treatments, counseling her about her condition (please see the discussed topics above), and  developing a plan to further investigate and treat it.  Component     Latest Ref Rng & Units 01/01/2017  TSH     0.35 - 4.50 uIU/mL 0.50  T4,Free(Direct)     0.60 - 1.60 ng/dL 0.73  Triiodothyronine,Free,Serum     2.3 - 4.2 pg/mL 4.2   Tests are normal, but TSH is closer to the lower limit of normal and free T3 is at the upper limit of normal. We'll start PTU 50 mg daily. She will need a repeat set of labs in 1.5 months.  Philemon Kingdom, MD PhD [facility]

## 2016-12-25 NOTE — Telephone Encounter (Signed)
Pt called back stating that her Vitamin B Complex does contain 169mcg of Biotin. She will wait to have her labs done next week per Dr Arman Filter instructions.

## 2016-12-25 NOTE — Patient Instructions (Signed)
Please come back for labs next week. Stay off the B complex until then (if it has Biotin).  Please come back for a follow-up appointment in 6 months.

## 2016-12-26 NOTE — Telephone Encounter (Signed)
Ok, Thank you! 

## 2016-12-26 NOTE — Telephone Encounter (Signed)
Wanted you to be aware. Thanks!   

## 2017-01-01 ENCOUNTER — Other Ambulatory Visit (INDEPENDENT_AMBULATORY_CARE_PROVIDER_SITE_OTHER): Payer: Managed Care, Other (non HMO)

## 2017-01-01 DIAGNOSIS — E0500 Thyrotoxicosis with diffuse goiter without thyrotoxic crisis or storm: Secondary | ICD-10-CM

## 2017-01-01 DIAGNOSIS — E0590 Thyrotoxicosis, unspecified without thyrotoxic crisis or storm: Secondary | ICD-10-CM | POA: Diagnosis not present

## 2017-01-01 LAB — TSH: TSH: 0.5 u[IU]/mL (ref 0.35–4.50)

## 2017-01-01 LAB — T4, FREE: Free T4: 0.73 ng/dL (ref 0.60–1.60)

## 2017-01-01 LAB — T3, FREE: T3 FREE: 4.2 pg/mL (ref 2.3–4.2)

## 2017-01-01 MED ORDER — PROPYLTHIOURACIL 50 MG PO TABS: 50.0000 mg | ORAL_TABLET | Freq: Every day | ORAL | 5 refills | Status: DC

## 2017-01-01 NOTE — Addendum Note (Signed)
Addended by: Philemon Kingdom on: 01/01/2017 05:33 PM   Modules accepted: Orders

## 2017-01-02 ENCOUNTER — Telehealth: Payer: Self-pay

## 2017-01-02 NOTE — Telephone Encounter (Signed)
LVM, gave lab results. Gave call back number if any questions or concerns.  

## 2017-01-02 NOTE — Telephone Encounter (Signed)
-----   Message from Philemon Kingdom, MD sent at 01/01/2017  5:34 PM EDT ----- Almyra Free, can you please call pt:  Tests are normal, but TSH is closer to the lower limit of normal and free T3 is at the upper limit of normal. We need to start PTU 50 mg daily (sent Rx). She will need a repeat set of labs in 1.5 months (labs are in).

## 2017-02-23 ENCOUNTER — Encounter: Payer: Self-pay | Admitting: Obstetrics

## 2017-02-23 ENCOUNTER — Ambulatory Visit (INDEPENDENT_AMBULATORY_CARE_PROVIDER_SITE_OTHER): Payer: Managed Care, Other (non HMO) | Admitting: Obstetrics

## 2017-02-23 VITALS — Wt 214.4 lb

## 2017-02-23 DIAGNOSIS — Z113 Encounter for screening for infections with a predominantly sexual mode of transmission: Secondary | ICD-10-CM | POA: Diagnosis not present

## 2017-02-23 DIAGNOSIS — Z1151 Encounter for screening for human papillomavirus (HPV): Secondary | ICD-10-CM

## 2017-02-23 DIAGNOSIS — Z3202 Encounter for pregnancy test, result negative: Secondary | ICD-10-CM | POA: Diagnosis not present

## 2017-02-23 DIAGNOSIS — R102 Pelvic and perineal pain: Secondary | ICD-10-CM

## 2017-02-23 DIAGNOSIS — D251 Intramural leiomyoma of uterus: Secondary | ICD-10-CM

## 2017-02-23 DIAGNOSIS — Z01419 Encounter for gynecological examination (general) (routine) without abnormal findings: Secondary | ICD-10-CM

## 2017-02-23 DIAGNOSIS — N946 Dysmenorrhea, unspecified: Secondary | ICD-10-CM

## 2017-02-23 DIAGNOSIS — Z124 Encounter for screening for malignant neoplasm of cervix: Secondary | ICD-10-CM

## 2017-02-23 LAB — POCT URINALYSIS DIPSTICK
Appearance: NORMAL
BILIRUBIN UA: NEGATIVE
Blood, UA: NEGATIVE
GLUCOSE UA: NEGATIVE
KETONES UA: NEGATIVE
Leukocytes, UA: NEGATIVE
Nitrite, UA: NEGATIVE
PH UA: 6 (ref 5.0–8.0)
Spec Grav, UA: 1.025 (ref 1.010–1.025)
Urobilinogen, UA: NEGATIVE E.U./dL — AB

## 2017-02-23 LAB — POCT URINE PREGNANCY: Preg Test, Ur: NEGATIVE

## 2017-02-23 MED ORDER — PRENATAL PLUS/IRON 27-1 MG PO TABS: 1.0000 | ORAL_TABLET | Freq: Every day | ORAL | 4 refills | Status: DC

## 2017-02-23 MED ORDER — IBUPROFEN 800 MG PO TABS: 800.0000 mg | ORAL_TABLET | Freq: Three times a day (TID) | ORAL | 5 refills | Status: DC | PRN

## 2017-02-23 NOTE — Progress Notes (Signed)
RGYN patient presents for F/U visit today.  Pt states she is having low pelvic  pain notes pain scale 10/10x. Pt states pain interferes w/ daily activity.takes IBP for relief.  Pt request prenatal vitamins  Still trying to conceive. Wants to discuss having a HSG done.

## 2017-02-23 NOTE — Progress Notes (Addendum)
Subjective:        Krista Hogan is a 36 y.o. female here for a routine exam.  Current complaints: Pelvic pain.  Has history of uterine fibroids.  Trying to conceive.  S/P operative laparoscopic removal of uterine fibroids in 2015 and chromopertubation wiyh negative spillage from fimbria.    History of breast cancer, family member who is a Field seismologist syndrome-associated carrier: no Is the patient overweight and hypertensive, family history of diabetes, personal history of gestational diabetes, preeclampsia or PCOS: no Is patient over 52, have PCOS,  family history of premature CHD under age 69, diabetes, smoke, have hypertension or peripheral artery disease:  no At any time, has a partner hit, kicked or otherwise hurt or frightened you?: no Over the past 2 weeks, have you felt down, depressed or hopeless?: no Over the past 2 weeks, have you felt little interest or pleasure in doing things?:no   Gynecologic History Patient's last menstrual period was 02/05/2017 (approximate). Contraception: none Last Pap: 2017. Results were: normal Last mammogram: n/a. Results were: n/a  Obstetric History OB History  Gravida Para Term Preterm AB Living  0 0 0 0 0 0  SAB TAB Ectopic Multiple Live Births  0 0 0 0          Past Medical History:  Diagnosis Date  . DUB (dysfunctional uterine bleeding)   . History of abnormal cervical Pap smear    2011  . History of HPV infection   . Uterine fibroid   . Wears glasses     Past Surgical History:  Procedure Laterality Date  . EXCISION OF SKIN TAG  11/22/2014   Procedure: EXCISION OF SKIN TAG VULVA;  Surgeon: Governor Specking, MD;  Location: [place];  Service: Gynecology;;  . LAPAROSCOPIC GELPORT ASSISTED MYOMECTOMY N/A 11/22/2014   Procedure: LAPAROSCOPIC GELPORT ASSISTED MYOMECTOMY WITH CHROMOTUBATION;  Surgeon: Governor Specking, MD;  Location: [place];  Service: Gynecology;  Laterality: N/A;  . LAPAROSCOPY  N/A 11/22/2014   Procedure: LAPAROSCOPY DIAGNOSTIC;  Surgeon: Governor Specking, MD;  Location: [place];  Service: Gynecology;  Laterality: N/A;  . LEEP  2011  . TONSILLECTOMY AND ADENOIDECTOMY  age 3  . WISDOM TOOTH EXTRACTION  2006     Current Outpatient Medications:  .  cholecalciferol (VITAMIN D) 1000 units tablet, Take 1,000 Units by mouth daily., Disp: , Rfl:  .  folic acid (FOLVITE) 1 MG tablet, Take 1 mg by mouth daily., Disp: , Rfl:  .  ibuprofen (ADVIL,MOTRIN) 800 MG tablet, Take 1 tablet (800 mg total) by mouth every 8 (eight) hours as needed., Disp: 60 tablet, Rfl: 5 .  Multiple Vitamin (MULTIVITAMIN WITH MINERALS) TABS tablet, Take 1 tablet by mouth daily., Disp: , Rfl:  .  Omega-3 Fatty Acids (FISH OIL) 1000 MG CAPS, Take 1 capsule by mouth daily., Disp: , Rfl:  .  propylthiouracil (PTU) 50 MG tablet, Take 1 tablet (50 mg total) by mouth daily., Disp: 45 tablet, Rfl: 5 .  diclofenac (VOLTAREN) 75 MG EC tablet, Take 1 tablet (75 mg total) by mouth 2 (two) times daily with a meal. (Patient not taking: Reported on 02/23/2017), Disp: 60 tablet, Rfl: 2 .  imiquimod (ALDARA) 5 % cream, Apply topically 3 (three) times a week. (Patient not taking: Reported on 02/23/2017), Disp: 12 each, Rfl: 0 .  lidocaine (XYLOCAINE) 5 % ointment, Apply 1 application topically as needed. (Patient not taking: Reported on 02/23/2017), Disp: 35.44 g, Rfl: 0 .  Oxycodone HCl 10 MG TABS, Take 1 tablet (10 mg total) by mouth every 6 (six) hours as needed. (Patient not taking: Reported on 02/23/2017), Disp: 40 tablet, Rfl: 0 No Known Allergies  Social History   Tobacco Use  . Smoking status: Current Some Day Smoker    Packs/day: 0.25    Years: 10.00    Pack years: 2.50    Types: Cigarettes  . Smokeless tobacco: Never Used  Substance Use Topics  . Alcohol use: Yes    Alcohol/week: 0.0 oz    Comment: SOCIAL    Family History  Problem Relation Age of Onset  . Fibroids Mother   .  Thyroid disease Neg Hx       Review of Systems  Constitutional: negative for fatigue and weight loss Respiratory: negative for cough and wheezing Cardiovascular: negative for chest pain, fatigue and palpitations Gastrointestinal: negative for abdominal pain and change in bowel habits Musculoskeletal:negative for myalgias Neurological: negative for gait problems and tremors Behavioral/Psych: negative for abusive relationship, depression Endocrine: negative for temperature intolerance    Genitourinary:negative for abnormal menstrual periods, genital lesions, hot flashes, sexual problems and vaginal discharge Integument/breast: negative for breast lump, breast tenderness, nipple discharge and skin lesion(s)    Objective:       Wt 214 lb 6.4 oz (97.3 kg)   LMP 02/05/2017 (Approximate)   BMI 36.80 kg/m  General:   alert  Skin:   no rash or abnormalities  Lungs:   clear to auscultation bilaterally  Heart:   regular rate and rhythm, S1, S2 normal, no murmur, click, rub or gallop  Breasts:   normal without suspicious masses, skin or nipple changes or axillary nodes  Abdomen:  normal findings: no organomegaly, soft, non-tender and no hernia  Pelvis:  External genitalia: normal general appearance Urinary system: urethral meatus normal and bladder without fullness, nontender Vaginal: normal without tenderness, induration or masses Cervix: normal appearance Adnexa: normal bimanual exam Uterus: anteverted and non-tender, normal size   Lab Review Urine pregnancy test Labs reviewed yes Radiologic studies reviewed yes  50% of 20 min visit spent on counseling and coordination of care.   Assessment:    1. Encounter for gynecological examination with Papanicolaou smear of cervix Rx: - POCT urine pregnancy - POCT Urinalysis Dipstick - Cytology - PAP - Cervicovaginal ancillary only - Prenatal Vit-Fe Fumarate-FA (PRENATAL PLUS/IRON) 27-1 MG TABS; Take 1 tablet by mouth daily before  breakfast.  Dispense: 90 each; Refill: 4  2. Pelvic pain - needs ultrasound - Ibuprofen prn  3. Fibroids, intramural - S/P Myomectomy  4. Dysmenorrhea Rx: - ibuprofen (ADVIL,MOTRIN) 800 MG tablet; Take 1 tablet (800 mg total) by mouth every 8 (eight) hours as needed.  Dispense: 60 tablet; Refill: 5    5. Infertility - follow up with Dr. Kerin Perna      Plan:    Education reviewed: calcium supplements, depression evaluation, low fat, low cholesterol diet, safe sex/STD prevention, self breast exams, smoking cessation and weight bearing exercise. Contraception: none. Follow up in: 1 year.   No orders of the defined types were placed in this encounter.  Orders Placed This Encounter  Procedures  . POCT urine pregnancy  . POCT Urinalysis Dipstick

## 2017-02-24 LAB — CERVICOVAGINAL ANCILLARY ONLY
BACTERIAL VAGINITIS: NEGATIVE
Candida vaginitis: NEGATIVE
Chlamydia: NEGATIVE
NEISSERIA GONORRHEA: NEGATIVE
Trichomonas: NEGATIVE

## 2017-02-25 LAB — CYTOLOGY - PAP
DIAGNOSIS: NEGATIVE
HPV: NOT DETECTED

## 2017-04-13 ENCOUNTER — Encounter (HOSPITAL_COMMUNITY): Payer: Self-pay | Admitting: *Deleted

## 2017-04-13 ENCOUNTER — Inpatient Hospital Stay (HOSPITAL_COMMUNITY)
Admission: AD | Admit: 2017-04-13 | Discharge: 2017-04-13 | Disposition: A | Discharge status: Home / Self Care | Payer: Managed Care, Other (non HMO) | Source: Ambulatory Visit | Attending: Obstetrics and Gynecology | Admitting: Obstetrics and Gynecology

## 2017-04-13 ENCOUNTER — Telehealth: Payer: Self-pay | Admitting: Internal Medicine

## 2017-04-13 ENCOUNTER — Inpatient Hospital Stay (HOSPITAL_COMMUNITY): Payer: Managed Care, Other (non HMO)

## 2017-04-13 DIAGNOSIS — O26891 Other specified pregnancy related conditions, first trimester: Secondary | ICD-10-CM

## 2017-04-13 DIAGNOSIS — R109 Unspecified abdominal pain: Secondary | ICD-10-CM | POA: Diagnosis not present

## 2017-04-13 DIAGNOSIS — O209 Hemorrhage in early pregnancy, unspecified: Secondary | ICD-10-CM | POA: Diagnosis not present

## 2017-04-13 DIAGNOSIS — N83202 Unspecified ovarian cyst, left side: Secondary | ICD-10-CM | POA: Insufficient documentation

## 2017-04-13 DIAGNOSIS — Z3A00 Weeks of gestation of pregnancy not specified: Secondary | ICD-10-CM | POA: Diagnosis not present

## 2017-04-13 DIAGNOSIS — N83201 Unspecified ovarian cyst, right side: Secondary | ICD-10-CM | POA: Diagnosis not present

## 2017-04-13 DIAGNOSIS — F17210 Nicotine dependence, cigarettes, uncomplicated: Secondary | ICD-10-CM | POA: Insufficient documentation

## 2017-04-13 DIAGNOSIS — O3481 Maternal care for other abnormalities of pelvic organs, first trimester: Secondary | ICD-10-CM | POA: Diagnosis not present

## 2017-04-13 DIAGNOSIS — O26899 Other specified pregnancy related conditions, unspecified trimester: Secondary | ICD-10-CM

## 2017-04-13 DIAGNOSIS — O4690 Antepartum hemorrhage, unspecified, unspecified trimester: Secondary | ICD-10-CM | POA: Diagnosis not present

## 2017-04-13 DIAGNOSIS — O3680X0 Pregnancy with inconclusive fetal viability, not applicable or unspecified: Secondary | ICD-10-CM

## 2017-04-13 DIAGNOSIS — O99331 Smoking (tobacco) complicating pregnancy, first trimester: Secondary | ICD-10-CM | POA: Diagnosis not present

## 2017-04-13 LAB — URINALYSIS, ROUTINE W REFLEX MICROSCOPIC
Bilirubin Urine: NEGATIVE
GLUCOSE, UA: NEGATIVE mg/dL
KETONES UR: NEGATIVE mg/dL
Leukocytes, UA: NEGATIVE
Nitrite: NEGATIVE
PROTEIN: 100 mg/dL — AB
Specific Gravity, Urine: 1.034 — ABNORMAL HIGH (ref 1.005–1.030)
WBC UA: NONE SEEN WBC/hpf (ref 0–5)
pH: 5 (ref 5.0–8.0)

## 2017-04-13 LAB — WET PREP, GENITAL
Clue Cells Wet Prep HPF POC: NONE SEEN
Sperm: NONE SEEN
Trich, Wet Prep: NONE SEEN
Yeast Wet Prep HPF POC: NONE SEEN

## 2017-04-13 LAB — CBC
HCT: 38.8 % (ref 36.0–46.0)
Hemoglobin: 13.1 g/dL (ref 12.0–15.0)
MCH: 32.3 pg (ref 26.0–34.0)
MCHC: 33.8 g/dL (ref 30.0–36.0)
MCV: 95.6 fL (ref 78.0–100.0)
PLATELETS: 288 10*3/uL (ref 150–400)
RBC: 4.06 MIL/uL (ref 3.87–5.11)
RDW: 13.5 % (ref 11.5–15.5)
WBC: 8.6 10*3/uL (ref 4.0–10.5)

## 2017-04-13 LAB — HCG, QUANTITATIVE, PREGNANCY: HCG, BETA CHAIN, QUANT, S: 151 m[IU]/mL — AB (ref <5)

## 2017-04-13 LAB — POCT PREGNANCY, URINE: PREG TEST UR: POSITIVE — AB

## 2017-04-13 NOTE — Telephone Encounter (Signed)
LMTCB

## 2017-04-13 NOTE — MAU Note (Addendum)
Patient c/o  +lower abdominal pain intermittent Rating 8/10 Took 800mg  ibuprofen about an hour ago with some relief States feels full and pressure OB told her to come in  +vaginal bleeding LMP 03/29/16--stopped on 04/05/16 Started bleeding again on Wednesday Gone from red in color to brown; having to wear a pad  Endorses having fibroids Endorses desire for pregnancy; started fertility medications soon.

## 2017-04-13 NOTE — Telephone Encounter (Signed)
Please advise on below  

## 2017-04-13 NOTE — MAU Provider Note (Signed)
History     CSN: [id number]  Arrival date and time: 04/13/17 1334   First Provider Initiated Contact with Patient 04/13/17 1414      Chief Complaint  Patient presents with  . Abdominal Pain  . Vaginal Bleeding   37 y.o. Female here with LAP and irregular VB. LAP started weeks ago and has worsened over the last 5 days. Pain is bilateral and lower but worse on right side. Took Ibuprofen this am and helped. No fevers. No urinary sx. VB also started 5 days ago but LMP was 03/29/17. Bleeding was red then brown but back to red today. She is trying to conceive and soon to start infertility treatments.    Past Medical History:  Diagnosis Date  . DUB (dysfunctional uterine bleeding)   . History of abnormal cervical Pap smear    2011  . History of HPV infection   . Uterine fibroid   . Wears glasses     Past Surgical History:  Procedure Laterality Date  . EXCISION OF SKIN TAG  11/22/2014   Procedure: EXCISION OF SKIN TAG VULVA;  Surgeon: Governor Specking, MD;  Location: [place];  Service: Gynecology;;  . LAPAROSCOPIC GELPORT ASSISTED MYOMECTOMY N/A 11/22/2014   Procedure: LAPAROSCOPIC GELPORT ASSISTED MYOMECTOMY WITH CHROMOTUBATION;  Surgeon: Governor Specking, MD;  Location: [place];  Service: Gynecology;  Laterality: N/A;  . LAPAROSCOPY N/A 11/22/2014   Procedure: LAPAROSCOPY DIAGNOSTIC;  Surgeon: Governor Specking, MD;  Location: [place];  Service: Gynecology;  Laterality: N/A;  . LEEP  2011  . TONSILLECTOMY AND ADENOIDECTOMY  age 2  . WISDOM TOOTH EXTRACTION  2006    Family History  Problem Relation Age of Onset  . Fibroids Mother   . Thyroid disease Neg Hx     Social History   Tobacco Use  . Smoking status: Current Some Day Smoker    Packs/day: 0.25    Years: 10.00    Pack years: 2.50    Types: Cigarettes  . Smokeless tobacco: Never Used  Substance Use Topics  . Alcohol use: Yes    Alcohol/week: 0.0 oz    Comment:  SOCIAL  . Drug use: No    Allergies: No Known Allergies  Medications Prior to Admission  Medication Sig Dispense Refill Last Dose  . cholecalciferol (VITAMIN D) 1000 units tablet Take 1,000 Units by mouth daily.   Taking  . diclofenac (VOLTAREN) 75 MG EC tablet Take 1 tablet (75 mg total) by mouth 2 (two) times daily with a meal. (Patient not taking: Reported on 02/23/2017) 60 tablet 2 Not Taking  . folic acid (FOLVITE) 1 MG tablet Take 1 mg by mouth daily.   Taking  . ibuprofen (ADVIL,MOTRIN) 800 MG tablet Take 1 tablet (800 mg total) by mouth every 8 (eight) hours as needed. 60 tablet 5   . imiquimod (ALDARA) 5 % cream Apply topically 3 (three) times a week. (Patient not taking: Reported on 02/23/2017) 12 each 0 Not Taking  . lidocaine (XYLOCAINE) 5 % ointment Apply 1 application topically as needed. (Patient not taking: Reported on 02/23/2017) 35.44 g 0 Not Taking  . Multiple Vitamin (MULTIVITAMIN WITH MINERALS) TABS tablet Take 1 tablet by mouth daily.   Taking  . Omega-3 Fatty Acids (FISH OIL) 1000 MG CAPS Take 1 capsule by mouth daily.   Taking  . Oxycodone HCl 10 MG TABS Take 1 tablet (10 mg total) by mouth every 6 (six) hours as needed. (Patient not taking: Reported  on 02/23/2017) 40 tablet 0 Not Taking  . Prenatal Vit-Fe Fumarate-FA (PRENATAL PLUS/IRON) 27-1 MG TABS Take 1 tablet by mouth daily before breakfast. 90 each 4   . propylthiouracil (PTU) 50 MG tablet Take 1 tablet (50 mg total) by mouth daily. 45 tablet 5 Taking    Review of Systems  Constitutional: Negative for chills and fever.  Gastrointestinal: Positive for abdominal pain. Negative for constipation and diarrhea.  Genitourinary: Positive for vaginal bleeding. Negative for pelvic pain.   Physical Exam   Blood pressure 133/81, pulse 70, temperature 98.3 F (36.8 C), temperature source Oral, resp. rate 18, weight 217 lb 0.6 oz (98.4 kg), last menstrual period 03/29/2016, SpO2 99 %.  Physical Exam  Constitutional:  She is oriented to person, place, and time. She appears well-developed and well-nourished. No distress.  HENT:  Head: Normocephalic and atraumatic.  Neck: Normal range of motion.  Cardiovascular: Normal rate.  Respiratory: Effort normal. No respiratory distress.  GI: Soft. She exhibits no distension and no mass. There is no tenderness. There is no rebound and no guarding.  Genitourinary:  Genitourinary Comments: External: no lesions or erythema Vagina: rugated, pink, moist, small drk red bloody discharge, cleared with 1 fox swab Uterus: + enlarged, anteverted, non tender, no CMT Adnexae: no masses, no tenderness left, no tenderness right   Musculoskeletal: Normal range of motion.  Neurological: She is alert and oriented to person, place, and time.  Skin: Skin is warm and dry.  Psychiatric: She has a normal mood and affect.   Results for orders placed or performed during the hospital encounter of 04/13/17 (from the past 24 hour(s))  Urinalysis, Routine w reflex microscopic     Status: Abnormal   Collection Time: 04/13/17  1:46 PM  Result Value Ref Range   Color, Urine AMBER (A) YELLOW   APPearance CLOUDY (A) CLEAR   Specific Gravity, Urine 1.034 (H) 1.005 - 1.030   pH 5.0 5.0 - 8.0   Glucose, UA NEGATIVE NEGATIVE mg/dL   Hgb urine dipstick LARGE (A) NEGATIVE   Bilirubin Urine NEGATIVE NEGATIVE   Ketones, ur NEGATIVE NEGATIVE mg/dL   Protein, ur 100 (A) NEGATIVE mg/dL   Nitrite NEGATIVE NEGATIVE   Leukocytes, UA NEGATIVE NEGATIVE   RBC / HPF TOO NUMEROUS TO COUNT 0 - 5 RBC/hpf   WBC, UA NONE SEEN 0 - 5 WBC/hpf   Bacteria, UA RARE (A) NONE SEEN   Squamous Epithelial / LPF 0-5 (A) NONE SEEN   Mucus PRESENT   Pregnancy, urine POC     Status: Abnormal   Collection Time: 04/13/17  2:03 PM  Result Value Ref Range   Preg Test, Ur POSITIVE (A) NEGATIVE  Wet prep, genital     Status: Abnormal   Collection Time: 04/13/17  2:19 PM  Result Value Ref Range   Yeast Wet Prep HPF POC NONE  SEEN NONE SEEN   Trich, Wet Prep NONE SEEN NONE SEEN   Clue Cells Wet Prep HPF POC NONE SEEN NONE SEEN   WBC, Wet Prep HPF POC FEW (A) NONE SEEN   Sperm NONE SEEN   CBC     Status: None   Collection Time: 04/13/17  2:27 PM  Result Value Ref Range   WBC 8.6 4.0 - 10.5 K/uL   RBC 4.06 3.87 - 5.11 MIL/uL   Hemoglobin 13.1 12.0 - 15.0 g/dL   HCT 38.8 36.0 - 46.0 %   MCV 95.6 78.0 - 100.0 fL   MCH 32.3 26.0 - 34.0  pg   MCHC 33.8 30.0 - 36.0 g/dL   RDW 13.5 11.5 - 15.5 %   Platelets 288 150 - 400 K/uL  hCG, quantitative, pregnancy     Status: Abnormal   Collection Time: 04/13/17  2:27 PM  Result Value Ref Range   hCG, Beta Chain, Quant, S 151 (H) <5 mIU/mL   US Ob Less Than 14 Weeks With Ob Transvaginal  Result Date: 04/13/2017 CLINICAL DATA:  Abdominal pain and vaginal bleeding in pregnancy; quantitative beta HCG = 151 EXAM: OBSTETRIC <14 WK [place] AND TRANSVAGINAL OB US TECHNIQUE: Both transabdominal and transvaginal ultrasound examinations were performed for complete evaluation of the gestation as well as the maternal uterus, adnexal regions, and pelvic cul-de-sac. Transvaginal technique was performed to assess early pregnancy. COMPARISON:  None for this gestation FINDINGS: Intrauterine gestational sac: Questionably visualized Yolk sac:  N/A Embryo:  N/A Cardiac Activity: N/A Heart Rate: N/A  bpm Subchorionic hemorrhage:  N/A Maternal uterus/adnexae: Small amount of questionable fluid is seen within the endometrial canal at the upper uterine segment, uncertain if represents endometrial fluid or early gestational sac. Minimal decidual reaction. No focal uterine mass is otherwise visualized. The RIGHT ovary contains a multi-septated predominately cystic lesion 3.0 x 3.2 x 2.8 cm question hemorrhagic cyst. LEFT ovary contains a heterogeneous slightly hypoechoic nodular region without internal blood flow, question hemorrhagic corpus luteum, less likely tumor. No other adnexal masses or free pelvic fluid.  IMPRESSION: Questionable visualization of a gestational sac within the endometrial canal versus a small amount of complex endometrial fluid. Recommend followup sonography in 14 days to reassess. Probable hemorrhagic cyst of the RIGHT ovary with a questionable hemorrhagic corpus luteum versus less likely tumor at the LEFT ovary; these can also be assessed at time of follow-up imaging. Electronically Signed   By: Lavonia Dana M.D.   On: 04/13/2017 17:34    MAU Course  Procedures  MDM Labs ordered and reviewed. UPT positive-pt notified (unaware of pregnancy), will order quant HCG. [place] ordered. ? small IUGS seen on [place] but no YS or FP. Ovarian cyst present, might explain pain but cannot r/o ectopic. Will follow quant in 48 hrs. Stable for discharge home.   Assessment and Plan   1. Pregnancy of unknown anatomic location   2. Vaginal bleeding in pregnancy   3. Abdominal pain affecting pregnancy   4. Cysts of both ovaries    Discharge home Follow up in [place] on 04/15/17 @0900  Ectopic/return precautions  Allergies as of 04/13/2017   No Known Allergies     Medication List    STOP taking these medications   diclofenac 75 MG EC tablet Commonly known as:  VOLTAREN   ibuprofen 800 MG tablet Commonly known as:  ADVIL,MOTRIN   imiquimod 5 % cream Commonly known as:  ALDARA   lidocaine 5 % ointment Commonly known as:  XYLOCAINE   multivitamin with minerals Tabs tablet   Oxycodone HCl 10 MG Tabs   propylthiouracil 50 MG tablet Commonly known as:  PTU     TAKE these medications   cholecalciferol 1000 units tablet Commonly known as:  VITAMIN D Take 1,000 Units by mouth daily.   Fish Oil 1000 MG Caps Take 1 capsule by mouth daily.   folic acid 1 MG tablet Commonly known as:  FOLVITE Take 1 mg by mouth daily.   PRENATAL PLUS/IRON 27-1 MG Tabs Take 1 tablet by mouth daily before breakfast.      Julianne Handler, CNM 04/13/2017, 5:58 PM

## 2017-04-13 NOTE — Telephone Encounter (Signed)
Pt may or may not be pregnant. She will not know until a few days. So she is stopping her propylthiouracil (PTU) 50 MG tablet until she knows for sure,   Please call if any questions.  If she does not answer please leave message.

## 2017-04-13 NOTE — Telephone Encounter (Signed)
No, she does not need to stop. This is the preferred drug for 1st trimester of pregnancy. Let me know when she finds out for sure.

## 2017-04-13 NOTE — Discharge Instructions (Signed)

## 2017-04-14 LAB — GC/CHLAMYDIA PROBE AMP (~~LOC~~) NOT AT ARMC
Chlamydia: NEGATIVE
Neisseria Gonorrhea: NEGATIVE

## 2017-04-14 NOTE — Telephone Encounter (Signed)
Pt is aware.  

## 2017-04-15 ENCOUNTER — Ambulatory Visit: Payer: Managed Care, Other (non HMO) | Admitting: General Practice

## 2017-04-15 ENCOUNTER — Encounter: Payer: Self-pay | Admitting: Family Medicine

## 2017-04-15 DIAGNOSIS — O3680X0 Pregnancy with inconclusive fetal viability, not applicable or unspecified: Secondary | ICD-10-CM

## 2017-04-15 DIAGNOSIS — N83202 Unspecified ovarian cyst, left side: Secondary | ICD-10-CM

## 2017-04-15 DIAGNOSIS — N83201 Unspecified ovarian cyst, right side: Secondary | ICD-10-CM

## 2017-04-15 LAB — HCG, QUANTITATIVE, PREGNANCY: HCG, BETA CHAIN, QUANT, S: 55 m[IU]/mL — AB (ref <5)

## 2017-04-15 NOTE — Progress Notes (Signed)
Patient here for stat bhcg today. Patient reports continued lower abdominal pain/cramping. Patient rates the pain an 8 at it's worst but says pain has improved since MAU visit. Discussed with patient we are monitoring her bhcg levels today and asked she wait in lobby for results/updated plan of care. Patient verbalized understanding to all & had no questions at this time.  Reviewed results with Maye Hides who finds decreasing bhcg levels indicative of SAB. Patient should have repeat bhcg in 1 week and ultrasound in 2 weeks due to previously seen ovarian cysts. Scheduled for 2/20.  Informed patient of results, recommended follow up, & appt. Patient verbalized understanding to all & had no questions

## 2017-04-15 NOTE — Progress Notes (Signed)
Chart reviewed for nurse visit. Agree with plan of care.   Starr Lake, CNM 04/15/2017 1:44 PM

## 2017-04-17 ENCOUNTER — Ambulatory Visit: Payer: Managed Care, Other (non HMO) | Admitting: Obstetrics

## 2017-04-22 ENCOUNTER — Other Ambulatory Visit: Payer: Managed Care, Other (non HMO)

## 2017-04-22 DIAGNOSIS — O3680X0 Pregnancy with inconclusive fetal viability, not applicable or unspecified: Secondary | ICD-10-CM

## 2017-04-22 NOTE — Progress Notes (Signed)
Here for non-stat bhcg- c/o cramping and bleeding.  States having pressure/ pelvic pain =7/8 today and is using motrin 800mg  po every 8 hours. Has known fibroids. Used boric acid per vagina a few days. States bleeding like a period - changing pad 3x/day.  Discussed with Kerry Hough, PA and advised patient we think she is still completeing her miscarriage and advised to continue motrin/ tylenol as needed and force fluids. Also to go mau for severe pain. Will call her if labs indicate change in plan.

## 2017-04-23 LAB — BETA HCG QUANT (REF LAB): hCG Quant: 7 m[IU]/mL

## 2017-04-24 ENCOUNTER — Telehealth: Payer: Self-pay | Admitting: Certified Nurse Midwife

## 2017-04-24 NOTE — Telephone Encounter (Signed)
Notified pt quant HCG down to 7. Will need recheck in 1 week. Pt verbalized understanding and will come on 04/29/17 for lab.

## 2017-04-24 NOTE — Telephone Encounter (Signed)
No answer. Will call back later today.

## 2017-04-24 NOTE — Progress Notes (Signed)
I have reviewed the chart and agree with nursing staff's documentation of this patient's encounter.  Kerry Hough, PA-C 04/24/2017 8:12 AM

## 2017-04-28 ENCOUNTER — Encounter (INDEPENDENT_AMBULATORY_CARE_PROVIDER_SITE_OTHER): Payer: Self-pay

## 2017-04-28 ENCOUNTER — Encounter: Payer: Self-pay | Admitting: Internal Medicine

## 2017-04-29 ENCOUNTER — Other Ambulatory Visit: Payer: Managed Care, Other (non HMO)

## 2017-04-29 ENCOUNTER — Ambulatory Visit (HOSPITAL_COMMUNITY): Payer: Managed Care, Other (non HMO)

## 2017-06-02 ENCOUNTER — Ambulatory Visit (INDEPENDENT_AMBULATORY_CARE_PROVIDER_SITE_OTHER): Payer: Managed Care, Other (non HMO)

## 2017-06-02 DIAGNOSIS — O09291 Supervision of pregnancy with other poor reproductive or obstetric history, first trimester: Secondary | ICD-10-CM

## 2017-06-02 DIAGNOSIS — Z3201 Encounter for pregnancy test, result positive: Secondary | ICD-10-CM | POA: Diagnosis not present

## 2017-06-02 DIAGNOSIS — N912 Amenorrhea, unspecified: Secondary | ICD-10-CM

## 2017-06-02 DIAGNOSIS — O3680X0 Pregnancy with inconclusive fetal viability, not applicable or unspecified: Secondary | ICD-10-CM

## 2017-06-02 LAB — POCT URINE PREGNANCY: Preg Test, Ur: POSITIVE — AB

## 2017-06-02 NOTE — Progress Notes (Signed)
Krista Hogan presents today for UPT. She has no unusual complaints. LMP: Recent SAB, unsure LMP per pt    OBJECTIVE: Appears well, in no apparent distress.  OB History    Gravida  2   Para  0   Term  0   Preterm  0   AB  1   Living  0     SAB  1   TAB  0   Ectopic  0   Multiple  0   Live Births             Home UPT Result: Positive  In-Office UPT result: Positive  I have reviewed the patient's medical, obstetrical, social, and family histories, and medications.   ASSESSMENT: Positive pregnancy test  PLAN Prenatal care to be completed at:  [facility]  Recent SAB - schedule 4 wk viability/dating u/s

## 2017-06-03 NOTE — Progress Notes (Signed)
I have reviewed this chart and agree with the RN/CMA assessment and management.    Tavarion Babington C Lacharles Altschuler, MD, FACOG Attending Physician, [facility]  

## 2017-06-22 ENCOUNTER — Inpatient Hospital Stay (HOSPITAL_COMMUNITY): Payer: Managed Care, Other (non HMO)

## 2017-06-22 ENCOUNTER — Inpatient Hospital Stay (HOSPITAL_COMMUNITY)
Admission: AD | Admit: 2017-06-22 | Discharge: 2017-06-23 | Disposition: A | Discharge status: Home / Self Care | Payer: Managed Care, Other (non HMO) | Source: Ambulatory Visit | Attending: Obstetrics & Gynecology | Admitting: Obstetrics & Gynecology

## 2017-06-22 DIAGNOSIS — O99332 Smoking (tobacco) complicating pregnancy, second trimester: Secondary | ICD-10-CM | POA: Insufficient documentation

## 2017-06-22 DIAGNOSIS — R109 Unspecified abdominal pain: Secondary | ICD-10-CM | POA: Diagnosis not present

## 2017-06-22 DIAGNOSIS — Z3491 Encounter for supervision of normal pregnancy, unspecified, first trimester: Secondary | ICD-10-CM

## 2017-06-22 DIAGNOSIS — O26891 Other specified pregnancy related conditions, first trimester: Secondary | ICD-10-CM | POA: Diagnosis not present

## 2017-06-22 DIAGNOSIS — O26899 Other specified pregnancy related conditions, unspecified trimester: Secondary | ICD-10-CM

## 2017-06-22 DIAGNOSIS — Z3A08 8 weeks gestation of pregnancy: Secondary | ICD-10-CM | POA: Diagnosis not present

## 2017-06-22 HISTORY — DX: Thyrotoxicosis with diffuse goiter without thyrotoxic crisis or storm: E05.00

## 2017-06-22 LAB — CBC
HCT: 35.9 % — ABNORMAL LOW (ref 36.0–46.0)
Hemoglobin: 12.6 g/dL (ref 12.0–15.0)
MCH: 32.6 pg (ref 26.0–34.0)
MCHC: 35.1 g/dL (ref 30.0–36.0)
MCV: 92.8 fL (ref 78.0–100.0)
PLATELETS: 286 10*3/uL (ref 150–400)
RBC: 3.87 MIL/uL (ref 3.87–5.11)
RDW: 12.8 % (ref 11.5–15.5)
WBC: 10 10*3/uL (ref 4.0–10.5)

## 2017-06-22 LAB — URINALYSIS, ROUTINE W REFLEX MICROSCOPIC
BILIRUBIN URINE: NEGATIVE
Glucose, UA: NEGATIVE mg/dL
Hgb urine dipstick: NEGATIVE
KETONES UR: NEGATIVE mg/dL
Leukocytes, UA: NEGATIVE
NITRITE: NEGATIVE
Protein, ur: NEGATIVE mg/dL
Specific Gravity, Urine: 1.024 (ref 1.005–1.030)
pH: 7 (ref 5.0–8.0)

## 2017-06-22 LAB — WET PREP, GENITAL
CLUE CELLS WET PREP: NONE SEEN
Sperm: NONE SEEN
TRICH WET PREP: NONE SEEN
Yeast Wet Prep HPF POC: NONE SEEN

## 2017-06-22 LAB — HCG, QUANTITATIVE, PREGNANCY: hCG, Beta Chain, Quant, S: 88469 m[IU]/mL — ABNORMAL HIGH (ref <5)

## 2017-06-22 NOTE — MAU Note (Signed)
Positive Pregnancy test in the office.  U/S next week to confirm IUP.  No VB or abnormal discharge.  Having left lower abdominal pain w/ movement since getting in verbal argument around noon.

## 2017-06-22 NOTE — MAU Provider Note (Signed)
History     CSN: [id number]  Arrival date and time: 06/22/17 2019  Chief Complaint  Patient presents with  . Abdominal Pain   HPI Krista Hogan is a 37 y.o. G2P0010 at Unknown gestational age who presents with left lower quadrant pain. She reports a positive pregnancy test in the office last week. She states she got in an argument this afternoon and started having cramping in her lower abdomen. She rates the pain a 2/10 and has not tried anything for the pain. She denies any vaginal bleeding or abnormal discharge.   OB History    Gravida  2   Para  0   Term  0   Preterm  0   AB  1   Living  0     SAB  1   TAB  0   Ectopic  0   Multiple  0   Live Births              Past Medical History:  Diagnosis Date  . DUB (dysfunctional uterine bleeding)   . Graves disease 06/2016  . History of abnormal cervical Pap smear    2011  . History of HPV infection   . Uterine fibroid   . Wears glasses     Past Surgical History:  Procedure Laterality Date  . EXCISION OF SKIN TAG  11/22/2014   Procedure: EXCISION OF SKIN TAG VULVA;  Surgeon: Governor Specking, MD;  Location: [place];  Service: Gynecology;;  . LAPAROSCOPIC GELPORT ASSISTED MYOMECTOMY N/A 11/22/2014   Procedure: LAPAROSCOPIC GELPORT ASSISTED MYOMECTOMY WITH CHROMOTUBATION;  Surgeon: Governor Specking, MD;  Location: [place];  Service: Gynecology;  Laterality: N/A;  . LAPAROSCOPY N/A 11/22/2014   Procedure: LAPAROSCOPY DIAGNOSTIC;  Surgeon: Governor Specking, MD;  Location: [place];  Service: Gynecology;  Laterality: N/A;  . LEEP  2011  . TONSILLECTOMY AND ADENOIDECTOMY  age 23  . WISDOM TOOTH EXTRACTION  2006    Family History  Problem Relation Age of Onset  . Fibroids Mother   . Thyroid disease Neg Hx     Social History   Tobacco Use  . Smoking status: Current Some Day Smoker    Packs/day: 0.25    Years: 10.00    Pack years: 2.50    Types:  Cigarettes  . Smokeless tobacco: Never Used  Substance Use Topics  . Alcohol use: Yes    Alcohol/week: 0.0 oz    Comment: SOCIAL  . Drug use: No    Allergies: No Known Allergies  Medications Prior to Admission  Medication Sig Dispense Refill Last Dose  . cholecalciferol (VITAMIN D) 1000 units tablet Take 1,000 Units by mouth daily.   06/22/2017 at Unknown time  . Prenatal Vit-Fe Fumarate-FA (PRENATAL PLUS/IRON) 27-1 MG TABS Take 1 tablet by mouth daily before breakfast. 90 each 4 06/22/2017 at Unknown time  . folic acid (FOLVITE) 1 MG tablet Take 1 mg by mouth daily.   Taking  . Omega-3 Fatty Acids (FISH OIL) 1000 MG CAPS Take 1 capsule by mouth daily.   Taking    Review of Systems  Constitutional: Negative.  Negative for fatigue and fever.  HENT: Negative.   Respiratory: Negative.  Negative for shortness of breath.   Cardiovascular: Negative.  Negative for chest pain.  Gastrointestinal: Positive for abdominal pain. Negative for constipation, diarrhea, nausea and vomiting.  Genitourinary: Negative.  Negative for dysuria, vaginal bleeding and vaginal discharge.  Neurological: Negative.  Negative for  dizziness and headaches.   Physical Exam   Blood pressure 111/68, pulse 83, temperature 98.4 F (36.9 C), resp. rate 17.  Physical Exam  Nursing note and vitals reviewed. Constitutional: She is oriented to person, place, and time. She appears well-developed and well-nourished. No distress.  HENT:  Head: Normocephalic.  Eyes: Pupils are equal, round, and reactive to light.  Cardiovascular: Normal rate, regular rhythm and normal heart sounds.  Respiratory: Effort normal and breath sounds normal. No respiratory distress.  GI: Soft. Bowel sounds are normal. She exhibits no distension. There is no tenderness.  Neurological: She is alert and oriented to person, place, and time.  Skin: Skin is warm and dry.  Psychiatric: She has a normal mood and affect. Her behavior is normal. Judgment  and thought content normal.    MAU Course  Procedures Results for orders placed or performed during the hospital encounter of 06/22/17 (from the past 24 hour(s))  Urinalysis, Routine w reflex microscopic     Status: Abnormal   Collection Time: 06/22/17  8:28 PM  Result Value Ref Range   Color, Urine YELLOW YELLOW   APPearance HAZY (A) CLEAR   Specific Gravity, Urine 1.024 1.005 - 1.030   pH 7.0 5.0 - 8.0   Glucose, UA NEGATIVE NEGATIVE mg/dL   Hgb urine dipstick NEGATIVE NEGATIVE   Bilirubin Urine NEGATIVE NEGATIVE   Ketones, ur NEGATIVE NEGATIVE mg/dL   Protein, ur NEGATIVE NEGATIVE mg/dL   Nitrite NEGATIVE NEGATIVE   Leukocytes, UA NEGATIVE NEGATIVE  CBC     Status: Abnormal   Collection Time: 06/22/17  9:59 PM  Result Value Ref Range   WBC 10.0 4.0 - 10.5 K/uL   RBC 3.87 3.87 - 5.11 MIL/uL   Hemoglobin 12.6 12.0 - 15.0 g/dL   HCT 35.9 (L) 36.0 - 46.0 %   MCV 92.8 78.0 - 100.0 fL   MCH 32.6 26.0 - 34.0 pg   MCHC 35.1 30.0 - 36.0 g/dL   RDW 12.8 11.5 - 15.5 %   Platelets 286 150 - 400 K/uL  hCG, quantitative, pregnancy     Status: Abnormal   Collection Time: 06/22/17  9:59 PM  Result Value Ref Range   hCG, Beta Chain, Quant, S 88,469 (H) <5 mIU/mL  Wet prep, genital     Status: Abnormal   Collection Time: 06/22/17 11:35 PM  Result Value Ref Range   Yeast Wet Prep HPF POC NONE SEEN NONE SEEN   Trich, Wet Prep NONE SEEN NONE SEEN   Clue Cells Wet Prep HPF POC NONE SEEN NONE SEEN   WBC, Wet Prep HPF POC MODERATE (A) NONE SEEN   Sperm NONE SEEN    US Ob Comp Less 14 Wks  Result Date: 06/22/2017 CLINICAL DATA:  Initial evaluation for acute left lower quadrant pain, early pregnancy. EXAM: OBSTETRIC <14 WK [place] AND TRANSVAGINAL OB US TECHNIQUE: Both transabdominal and transvaginal ultrasound examinations were performed for complete evaluation of the gestation as well as the maternal uterus, adnexal regions, and pelvic cul-de-sac. Transvaginal technique was performed to assess  early pregnancy. COMPARISON:  None. FINDINGS: Intrauterine gestational sac: Single Yolk sac:  Present Embryo:  Present Cardiac Activity: Present Heart Rate: 165 bpm CRL: 21.2 mm   8 w   5 d                  [place] EDC: 01/27/2018 Subchorionic hemorrhage:  None visualized. Maternal uterus/adnexae: Ovaries are normal in appearance bilaterally. No adnexal mass. No free fluid. IMPRESSION: 1.  Single viable intrauterine pregnancy as above without complication, estimated gestational age [redacted] weeks and 5 days by crown-rump length. 2. No other acute maternal uterine or adnexal abnormality identified. Electronically Signed   By: Jeannine Boga M.D.   On: 06/22/2017 23:29   MDM UA, UPT CBC, HCG, ABO/Rh Wet prep and gc/chlamydia US OB Transvaginal US OB Comp Less 14 weeks  Assessment and Plan   1. Normal intrauterine pregnancy on prenatal ultrasound in first trimester   2. Abdominal pain affecting pregnancy   3. [redacted] weeks gestation of pregnancy    -Discharge home in stable condition -First trimester precautions discussed -Patient advised to follow-up with [facility] as scheduled to start prenatal care -Patient may return to MAU as needed or if her condition were to change or worsen  [place] 06/22/2017, 11:03 PM

## 2017-06-22 NOTE — Discharge Instructions (Signed)
Safe Medications in Pregnancy   Acne: Benzoyl Peroxide Salicylic Acid  Backache/Headache: Tylenol: 2 regular strength every 4 hours OR              2 Extra strength every 6 hours  Colds/Coughs/Allergies: Benadryl (alcohol free) 25 mg every 6 hours as needed Breath right strips Claritin Cepacol throat lozenges Chloraseptic throat spray Cold-Eeze- up to three times per day Cough drops, alcohol free Flonase (by prescription only) Guaifenesin Mucinex Robitussin DM (plain only, alcohol free) Saline nasal spray/drops Sudafed (pseudoephedrine) & Actifed ** use only after [redacted] weeks gestation and if you do not have high blood pressure Tylenol Vicks Vaporub Zinc lozenges Zyrtec   Constipation: Colace Ducolax suppositories Fleet enema Glycerin suppositories Metamucil Milk of magnesia Miralax Senokot Smooth move tea  Diarrhea: Kaopectate Imodium A-D  *NO pepto Bismol  Hemorrhoids: Anusol Anusol HC Preparation H Tucks  Indigestion: Tums Maalox Mylanta Zantac  Pepcid  Insomnia: Benadryl (alcohol free) 25mg every 6 hours as needed Tylenol PM Unisom, no Gelcaps  Leg Cramps: Tums MagGel  Nausea/Vomiting:  Bonine Dramamine Emetrol Ginger extract Sea bands Meclizine  Nausea medication to take during pregnancy:  Unisom (doxylamine succinate 25 mg tablets) Take one tablet daily at bedtime. If symptoms are not adequately controlled, the dose can be increased to a maximum recommended dose of two tablets daily (1/2 tablet in the morning, 1/2 tablet mid-afternoon and one at bedtime). Vitamin B6 100mg tablets. Take one tablet twice a day (up to 200 mg per day).  Skin Rashes: Aveeno products Benadryl cream or 25mg every 6 hours as needed Calamine Lotion 1% cortisone cream  Yeast infection: Gyne-lotrimin 7 Monistat 7   **If taking multiple medications, please check labels to avoid duplicating the same active ingredients **take medication as directed on  the label ** Do not exceed 4000 mg of tylenol in 24 hours **Do not take medications that contain aspirin or ibuprofen    First Trimester of Pregnancy The first trimester of pregnancy is from week 1 until the end of week 13 (months 1 through 3). A week after a sperm fertilizes an egg, the egg will implant on the wall of the uterus. This embryo will begin to develop into a baby. Genes from you and your partner will form the baby. The female genes will determine whether the baby will be a boy or a girl. At 6-8 weeks, the eyes and face will be formed, and the heartbeat can be seen on ultrasound. At the end of 12 weeks, all the baby's organs will be formed. Now that you are pregnant, you will want to do everything you can to have a healthy baby. Two of the most important things are to get good prenatal care and to follow your health care provider's instructions. Prenatal care is all the medical care you receive before the baby's birth. This care will help prevent, find, and treat any problems during the pregnancy and childbirth. Body changes during your first trimester Your body goes through many changes during pregnancy. The changes vary from woman to woman.  You may gain or lose a couple of pounds at first.  You may feel sick to your stomach (nauseous) and you may throw up (vomit). If the vomiting is uncontrollable, call your health care provider.  You may tire easily.  You may develop headaches that can be relieved by medicines. All medicines should be approved by your health care provider.  You may urinate more often. Painful urination may mean you have a   bladder infection.  You may develop heartburn as a result of your pregnancy.  You may develop constipation because certain hormones are causing the muscles that push stool through your intestines to slow down.  You may develop hemorrhoids or swollen veins (varicose veins).  Your breasts may begin to grow larger and become tender. Your  nipples may stick out more, and the tissue that surrounds them (areola) may become darker.  Your gums may bleed and may be sensitive to brushing and flossing.  Dark spots or blotches (chloasma, mask of pregnancy) may develop on your face. This will likely fade after the baby is born.  Your menstrual periods will stop.  You may have a loss of appetite.  You may develop cravings for certain kinds of food.  You may have changes in your emotions from day to day, such as being excited to be pregnant or being concerned that something may go wrong with the pregnancy and baby.  You may have more vivid and strange dreams.  You may have changes in your hair. These can include thickening of your hair, rapid growth, and changes in texture. Some women also have hair loss during or after pregnancy, or hair that feels dry or thin. Your hair will most likely return to normal after your baby is born.  What to expect at prenatal visits During a routine prenatal visit:  You will be weighed to make sure you and the baby are growing normally.  Your blood pressure will be taken.  Your abdomen will be measured to track your baby's growth.  The fetal heartbeat will be listened to between weeks 10 and 14 of your pregnancy.  Test results from any previous visits will be discussed.  Your health care provider may ask you:  How you are feeling.  If you are feeling the baby move.  If you have had any abnormal symptoms, such as leaking fluid, bleeding, severe headaches, or abdominal cramping.  If you are using any tobacco products, including cigarettes, chewing tobacco, and electronic cigarettes.  If you have any questions.  Other tests that may be performed during your first trimester include:  Blood tests to find your blood type and to check for the presence of any previous infections. The tests will also be used to check for low iron levels (anemia) and protein on red blood cells (Rh antibodies).  Depending on your risk factors, or if you previously had diabetes during pregnancy, you may have tests to check for high blood sugar that affects pregnant women (gestational diabetes).  Urine tests to check for infections, diabetes, or protein in the urine.  An ultrasound to confirm the proper growth and development of the baby.  Fetal screens for spinal cord problems (spina bifida) and Down syndrome.  HIV (human immunodeficiency virus) testing. Routine prenatal testing includes screening for HIV, unless you choose not to have this test.  You may need other tests to make sure you and the baby are doing well.  Follow these instructions at home: Medicines  Follow your health care provider's instructions regarding medicine use. Specific medicines may be either safe or unsafe to take during pregnancy.  Take a prenatal vitamin that contains at least 600 micrograms (mcg) of folic acid.  If you develop constipation, try taking a stool softener if your health care provider approves. Eating and drinking  Eat a balanced diet that includes fresh fruits and vegetables, whole grains, good sources of protein such as meat, eggs, or tofu, and low-fat dairy.   Your health care provider will help you determine the amount of weight gain that is right for you.  Avoid raw meat and uncooked cheese. These carry germs that can cause birth defects in the baby.  Eating four or five small meals rather than three large meals a day may help relieve nausea and vomiting. If you start to feel nauseous, eating a few soda crackers can be helpful. Drinking liquids between meals, instead of during meals, also seems to help ease nausea and vomiting.  Limit foods that are high in fat and processed sugars, such as fried and sweet foods.  To prevent constipation: ? Eat foods that are high in fiber, such as fresh fruits and vegetables, whole grains, and beans. ? Drink enough fluid to keep your urine clear or pale  yellow. Activity  Exercise only as directed by your health care provider. Most women can continue their usual exercise routine during pregnancy. Try to exercise for 30 minutes at least 5 days a week. Exercising will help you: ? Control your weight. ? Stay in shape. ? Be prepared for labor and delivery.  Experiencing pain or cramping in the lower abdomen or lower back is a good sign that you should stop exercising. Check with your health care provider before continuing with normal exercises.  Try to avoid standing for long periods of time. Move your legs often if you must stand in one place for a long time.  Avoid heavy lifting.  Wear low-heeled shoes and practice good posture.  You may continue to have sex unless your health care provider tells you not to. Relieving pain and discomfort  Wear a good support bra to relieve breast tenderness.  Take warm sitz baths to soothe any pain or discomfort caused by hemorrhoids. Use hemorrhoid cream if your health care provider approves.  Rest with your legs elevated if you have leg cramps or low back pain.  If you develop varicose veins in your legs, wear support hose. Elevate your feet for 15 minutes, 3-4 times a day. Limit salt in your diet. Prenatal care  Schedule your prenatal visits by the twelfth week of pregnancy. They are usually scheduled monthly at first, then more often in the last 2 months before delivery.  Write down your questions. Take them to your prenatal visits.  Keep all your prenatal visits as told by your health care provider. This is important. Safety  Wear your seat belt at all times when driving.  Make a list of emergency phone numbers, including numbers for family, friends, the hospital, and police and fire departments. General instructions  Ask your health care provider for a referral to a local prenatal education class. Begin classes no later than the beginning of month 6 of your pregnancy.  Ask for help if  you have counseling or nutritional needs during pregnancy. Your health care provider can offer advice or refer you to specialists for help with various needs.  Do not use hot tubs, steam rooms, or saunas.  Do not douche or use tampons or scented sanitary pads.  Do not cross your legs for long periods of time.  Avoid cat litter boxes and soil used by cats. These carry germs that can cause birth defects in the baby and possibly loss of the fetus by miscarriage or stillbirth.  Avoid all smoking, herbs, alcohol, and medicines not prescribed by your health care provider. Chemicals in these products affect the formation and growth of the baby.  Do not use any products that   contain nicotine or tobacco, such as cigarettes and e-cigarettes. If you need help quitting, ask your health care provider. You may receive counseling support and other resources to help you quit.  Schedule a dentist appointment. At home, brush your teeth with a soft toothbrush and be gentle when you floss. Contact a health care provider if:  You have dizziness.  You have mild pelvic cramps, pelvic pressure, or nagging pain in the abdominal area.  You have persistent nausea, vomiting, or diarrhea.  You have a bad smelling vaginal discharge.  You have pain when you urinate.  You notice increased swelling in your face, hands, legs, or ankles.  You are exposed to fifth disease or chickenpox.  You are exposed to German measles (rubella) and have never had it. Get help right away if:  You have a fever.  You are leaking fluid from your vagina.  You have spotting or bleeding from your vagina.  You have severe abdominal cramping or pain.  You have rapid weight gain or loss.  You vomit blood or material that looks like coffee grounds.  You develop a severe headache.  You have shortness of breath.  You have any kind of trauma, such as from a fall or a car accident. Summary  The first trimester of pregnancy is  from week 1 until the end of week 13 (months 1 through 3).  Your body goes through many changes during pregnancy. The changes vary from woman to woman.  You will have routine prenatal visits. During those visits, your health care provider will examine you, discuss any test results you may have, and talk with you about how you are feeling. This information is not intended to replace advice given to you by your health care provider. Make sure you discuss any questions you have with your health care provider. Document Released: 02/18/2001 Document Revised: 02/06/2016 Document Reviewed: 02/06/2016 Elsevier Interactive Patient Education  2018 [employer]  

## 2017-06-24 LAB — GC/CHLAMYDIA PROBE AMP (~~LOC~~) NOT AT ARMC
CHLAMYDIA, DNA PROBE: NEGATIVE
NEISSERIA GONORRHEA: NEGATIVE

## 2017-06-25 ENCOUNTER — Ambulatory Visit: Payer: Managed Care, Other (non HMO) | Admitting: Internal Medicine

## 2017-06-25 ENCOUNTER — Encounter: Payer: Self-pay | Admitting: Internal Medicine

## 2017-06-25 VITALS — BP 112/74 | HR 79 | Ht 64.0 in | Wt 222.0 lb

## 2017-06-25 DIAGNOSIS — E0500 Thyrotoxicosis with diffuse goiter without thyrotoxic crisis or storm: Secondary | ICD-10-CM | POA: Diagnosis not present

## 2017-06-25 LAB — TSH: TSH: 0.39 u[IU]/mL (ref 0.35–4.50)

## 2017-06-25 LAB — T4, FREE: Free T4: 0.72 ng/dL (ref 0.60–1.60)

## 2017-06-25 LAB — T3, FREE: T3, Free: 4.1 pg/mL (ref 2.3–4.2)

## 2017-06-25 MED ORDER — PROPYLTHIOURACIL 50 MG PO TABS: 50.0000 mg | ORAL_TABLET | Freq: Three times a day (TID) | ORAL | 1 refills | Status: DC

## 2017-06-25 NOTE — Patient Instructions (Addendum)
Please stop at the lab.  Please let me know when you enter the 2nd trimester so we can switch to Methimazole.  Please come back for a follow-up appointment in 4-5 months.

## 2017-06-25 NOTE — Progress Notes (Signed)
Patient ID: Krista Hogan, female   DOB: 05/03/1980, 37 y.o.   MRN: [id number]    HPI  Krista Hogan is a 37 y.o.-year-old female, returning for f/u for Graves ds. She previously saw Dr. Loanne Drilling, last visit 11/2015. Last visit with me 6 months ago.  She is now pregnant, in week 9.  She has nausea and food repulsion, but not necessarily vomiting.  Reviewed and addended history: She was dx'ed with thyrotoxicosis in Summer 2017. She was Rx'ed MMI >> initially refused 2/2 concern for poss. weight gain   After I saw her, we checked labs which confirmed Graves' disease so he started the low-dose methimazole, 2.5 mg daily.  She initially developed headaches, but then she realized that this could have been due to allergies.   Due to the plans for pregnancy, at last visit we switched to a low-dose PTU, 50 mg daily.  She did not come back for labs after we switched...  She is tolerating PTU well, but does mention that it has a foul taste.   Reviewed patient's TFTs: Lab Results  Component Value Date   TSH 0.50 01/01/2017   TSH 0.00 (L) 09/23/2016   TSH 0.01 (L) 10/20/2015   FREET4 0.73 01/01/2017   FREET4 1.05 09/23/2016   FREET4 2.0 (H) 10/20/2015  08/2016: TSH 0.011 (received after appt)  We diagnosed Graves' disease based on disease course and also elevated TSI antibodies: Lab Results  Component Value Date   TSI 273 (H) 09/23/2016   Of note, CBC was normal at 08/2016.  Pt denies: - feeling nodules in neck - hoarseness - dysphagia - choking - SOB with lying down  Pt does have a FH of thyroid ds. In cousin  No FH of thyroid cancer. No h/o radiation tx to head or neck.  No seaweed or kelp. No recent contrast studies. No herbal supplements. + Biotin use - last dose yesterday. No recent steroids use.   Pt. also has a history of myomectomy.  ROS: Constitutional: no weight gain/no weight loss, no fatigue, no subjective hyperthermia, + subjective hypothermia Eyes: no blurry  vision, no xerophthalmia ENT: no sore throat, + see HPI Cardiovascular: no CP/no SOB/no palpitations/no leg swelling Respiratory: no cough/no SOB/no wheezing Gastrointestinal: no N/no V/no D/no C/no acid reflux Musculoskeletal: no muscle aches/no joint aches Skin: no rashes, no hair loss Neurological: no tremors/no numbness/no tingling/no dizziness  I reviewed pt's medications, allergies, PMH, social hx, family hx, and changes were documented in the history of present illness. Otherwise, unchanged from my initial visit note.  Past Medical History:  Diagnosis Date  . DUB (dysfunctional uterine bleeding)   . Graves disease 06/2016  . History of abnormal cervical Pap smear    2011  . History of HPV infection   . Uterine fibroid   . Wears glasses    Past Surgical History:  Procedure Laterality Date  . EXCISION OF SKIN TAG  11/22/2014   Procedure: EXCISION OF SKIN TAG VULVA;  Surgeon: Governor Specking, MD;  Location: [place];  Service: Gynecology;;  . LAPAROSCOPIC GELPORT ASSISTED MYOMECTOMY N/A 11/22/2014   Procedure: LAPAROSCOPIC GELPORT ASSISTED MYOMECTOMY WITH CHROMOTUBATION;  Surgeon: Governor Specking, MD;  Location: [place];  Service: Gynecology;  Laterality: N/A;  . LAPAROSCOPY N/A 11/22/2014   Procedure: LAPAROSCOPY DIAGNOSTIC;  Surgeon: Governor Specking, MD;  Location: [place];  Service: Gynecology;  Laterality: N/A;  . LEEP  2011  . TONSILLECTOMY AND ADENOIDECTOMY  age 57  .  WISDOM TOOTH EXTRACTION  2006   Social History   Social History  . Marital status: Single    Spouse name: N/A  . Number of children: 0   Occupational History  . Lab tech   Social History Main Topics  . Smoking status: Current Some Day Smoker    Packs/day: 0.25    Years: 10.00    Types: Cigarettes  . Smokeless tobacco: Never Used  . Alcohol use 0.0 oz/week     Comment: SOCIAL  . Drug use: No  . Sexual activity: Yes    Birth control/  protection: None   Current Outpatient Medications on File Prior to Visit  Medication Sig Dispense Refill  . cholecalciferol (VITAMIN D) 1000 units tablet Take 1,000 Units by mouth daily.    . folic acid (FOLVITE) 1 MG tablet Take 1 mg by mouth daily.    . Omega-3 Fatty Acids (FISH OIL) 1000 MG CAPS Take 1 capsule by mouth daily.    . Prenatal Vit-Fe Fumarate-FA (PRENATAL PLUS/IRON) 27-1 MG TABS Take 1 tablet by mouth daily before breakfast. 90 each 4   No current facility-administered medications on file prior to visit.    No Known Allergies Family History  Problem Relation Age of Onset  . Fibroids Mother   . Thyroid disease Neg Hx    PE: BP 112/74   Pulse 79   Ht 5\' 4"  (1.626 m)   Wt 222 lb (100.7 kg)   LMP  (LMP Unknown)   SpO2 99%   BMI 38.11 kg/m  Wt Readings from Last 3 Encounters:  06/25/17 222 lb (100.7 kg)  04/13/17 217 lb 0.6 oz (98.4 kg)  02/23/17 214 lb 6.4 oz (97.3 kg)   Constitutional: overweight, in NAD Eyes: PERRLA, EOMI, no exophthalmos, no stare, no lid lag ENT: moist mucous membranes,  + mild thyromegaly, no cervical lymphadenopathy Cardiovascular: RRR, No MRG Respiratory: CTA B Gastrointestinal: abdomen soft, NT, ND, BS+ Musculoskeletal: no deformities, strength intact in all 4 Skin: moist, warm, no rashes Neurological: no tremor with outstretched hands, DTR normal in all 4  ASSESSMENT: 1. Graves ds  PLAN:  1. Patient with a history of thyrotoxicosis, confirmed as Graves' disease as her TSI antibodies returned elevated.  Therefore, we skipped a thyroid uptake and scan for diagnosis. - Of note, she was asymptomatic at diagnosis and now, without weight loss, heat intolerance, hypertrophy occasion, palpitations, anxiety.  However, she was contemplating a pregnancy and we discussed about the importance of having a thyroid test in the normal range when she becomes pregnant and throughout the pregnancy.  We did start methimazole 2.5 mg daily and then  switched to PTU 50 mg daily at last visit.  These are very low doses. - She is tolerating PTU very well, without side effects  - we will continue PTU for now but I advised her to let me know when she enters the second trimester so we can switch to methimazole if thionamides are still needed - we will recheck her TFTs today - No need for beta-blocker since she is not tachycardic - RTC in 6 months, but likely sooner for repeat labs.  We will check TSI antibodies at next visit.  Needs PTU refills.  - time spent with the patient: 15 min, of which >50% was spent in obtaining information about her symptoms, reviewing her previous labs, evaluations, and treatments, counseling her about her condition (please see the discussed topics above), and developing a plan to further investigate and treat it  Office Visit on 06/25/2017  Component Date Value Ref Range Status  . TSH 06/25/2017 0.39  0.35 - 4.50 uIU/mL Final  . Free T4 06/25/2017 0.72  0.60 - 1.60 ng/dL Final   Comment: Specimens from patients who are undergoing biotin therapy and /or ingesting biotin supplements may contain high levels of biotin.  The higher biotin concentration in these specimens interferes with this Free T4 assay.  Specimens that contain high levels  of biotin may cause false high results for this Free T4 assay.  Please interpret results in light of the total clinical presentation of the patient.    . T3, Free 06/25/2017 4.1  2.3 - 4.2 pg/mL Final   TFTs normal >> continue current PTU dose.  Philemon Kingdom, MD PhD [facility] Endocrin[facility]

## 2017-06-30 ENCOUNTER — Ambulatory Visit (HOSPITAL_COMMUNITY): Admission: RE | Admit: 2017-06-30 | Payer: Managed Care, Other (non HMO) | Source: Ambulatory Visit

## 2017-07-08 ENCOUNTER — Encounter: Payer: Self-pay | Admitting: Obstetrics

## 2017-07-08 ENCOUNTER — Ambulatory Visit (INDEPENDENT_AMBULATORY_CARE_PROVIDER_SITE_OTHER): Payer: Managed Care, Other (non HMO) | Admitting: Obstetrics

## 2017-07-08 VITALS — BP 101/70 | HR 72 | Wt 221.0 lb

## 2017-07-08 DIAGNOSIS — O0990 Supervision of high risk pregnancy, unspecified, unspecified trimester: Secondary | ICD-10-CM

## 2017-07-08 DIAGNOSIS — Z113 Encounter for screening for infections with a predominantly sexual mode of transmission: Secondary | ICD-10-CM

## 2017-07-08 DIAGNOSIS — O09521 Supervision of elderly multigravida, first trimester: Secondary | ICD-10-CM | POA: Diagnosis not present

## 2017-07-08 DIAGNOSIS — Z3480 Encounter for supervision of other normal pregnancy, unspecified trimester: Secondary | ICD-10-CM | POA: Insufficient documentation

## 2017-07-08 DIAGNOSIS — O0991 Supervision of high risk pregnancy, unspecified, first trimester: Secondary | ICD-10-CM

## 2017-07-08 DIAGNOSIS — O09529 Supervision of elderly multigravida, unspecified trimester: Secondary | ICD-10-CM

## 2017-07-08 DIAGNOSIS — E0500 Thyrotoxicosis with diffuse goiter without thyrotoxic crisis or storm: Secondary | ICD-10-CM

## 2017-07-08 DIAGNOSIS — Z8759 Personal history of other complications of pregnancy, childbirth and the puerperium: Secondary | ICD-10-CM

## 2017-07-08 DIAGNOSIS — Z9889 Other specified postprocedural states: Secondary | ICD-10-CM

## 2017-07-08 MED ORDER — PRENATAL PLUS/IRON 27-1 MG PO TABS: 1.0000 | ORAL_TABLET | Freq: Every day | ORAL | 4 refills | Status: DC

## 2017-07-08 NOTE — Progress Notes (Signed)
Subjective:    Krista Hogan is being seen today for her first obstetrical visit.  This is a planned pregnancy. She is at [redacted]w[redacted]d gestation. Her obstetrical history is significant for advanced maternal age and recent miscarriage.  Graves Disease, in remission.. Relationship with FOB: significant other, not living together. Patient does intend to breast feed. Pregnancy history fully reviewed.  The information documented in the HPI was reviewed and verified.  Menstrual History: OB History    Gravida  2   Para  0   Term  0   Preterm  0   AB  1   Living  0     SAB  1   TAB  0   Ectopic  0   Multiple  0   Live Births               Patient's last menstrual period was 04/22/2017.    Past Medical History:  Diagnosis Date  . DUB (dysfunctional uterine bleeding)   . Graves disease 06/2016  . History of abnormal cervical Pap smear    2011  . History of HPV infection   . Uterine fibroid   . Wears glasses     Past Surgical History:  Procedure Laterality Date  . EXCISION OF SKIN TAG  11/22/2014   Procedure: EXCISION OF SKIN TAG VULVA;  Surgeon: Governor Specking, MD;  Location: [facility];  Service: Gynecology;;  . LAPAROSCOPIC GELPORT ASSISTED MYOMECTOMY N/A 11/22/2014   Procedure: LAPAROSCOPIC GELPORT ASSISTED MYOMECTOMY WITH CHROMOTUBATION;  Surgeon: Governor Specking, MD;  Location: [facility];  Service: Gynecology;  Laterality: N/A;  . LAPAROSCOPY N/A 11/22/2014   Procedure: LAPAROSCOPY DIAGNOSTIC;  Surgeon: Governor Specking, MD;  Location: [facility];  Service: Gynecology;  Laterality: N/A;  . LEEP  2011  . TONSILLECTOMY AND ADENOIDECTOMY  age 89  . WISDOM TOOTH EXTRACTION  2006     (Not in a hospital admission) No Known Allergies  Social History   Tobacco Use  . Smoking status: Former Smoker    Packs/day: 0.25    Years: 10.00    Pack years: 2.50    Types: Cigarettes    Last attempt to quit: 04/2017    Years  since quitting: 0.2  . Smokeless tobacco: Never Used  Substance Use Topics  . Alcohol use: Yes    Alcohol/week: 0.0 oz    Comment: SOCIAL    Family History  Problem Relation Age of Onset  . Fibroids Mother   . Thyroid disease Neg Hx      Review of Systems Constitutional: negative for weight loss Gastrointestinal: negative for vomiting Genitourinary:negative for genital lesions and vaginal discharge and dysuria Musculoskeletal:negative for back pain Behavioral/Psych: negative for abusive relationship, depression, illegal drug usage and tobacco use    Objective:    BP 101/70   Pulse 72   Wt 221 lb (100.2 kg)   LMP 04/22/2017   BMI 37.93 kg/m  General Appearance:    Alert, cooperative, no distress, appears stated age  Head:    Normocephalic, without obvious abnormality, atraumatic  Eyes:    PERRL, conjunctiva/corneas clear, EOM's intact, fundi    benign, both eyes  Ears:    Normal TM's and external ear canals, both ears  Nose:   Nares normal, septum midline, mucosa normal, no drainage    or sinus tenderness  Throat:   Lips, mucosa, and tongue normal; teeth and gums normal  Neck:   Supple, symmetrical, trachea midline, no  adenopathy;    thyroid:  no enlargement/tenderness/nodules; no carotid   bruit or JVD  Back:     Symmetric, no curvature, ROM normal, no CVA tenderness  Lungs:     Clear to auscultation bilaterally, respirations unlabored  Chest Wall:    No tenderness or deformity   Heart:    Regular rate and rhythm, S1 and S2 normal, no murmur, rub   or gallop  Breast Exam:    No tenderness, masses, or nipple abnormality  Abdomen:     Soft, non-tender, bowel sounds active all four quadrants,    no masses, no organomegaly  Genitalia:    Normal female without lesion, discharge or tenderness  Extremities:   Extremities normal, atraumatic, no cyanosis or edema  Pulses:   2+ and symmetric all extremities  Skin:   Skin color, texture, turgor normal, no rashes or lesions   Lymph nodes:   Cervical, supraclavicular, and axillary nodes normal  Neurologic:   CNII-XII intact, normal strength, sensation and reflexes    throughout      Lab Review Urine pregnancy test Labs reviewed yes Radiologic studies reviewed yes Assessment:    Pregnancy at [redacted]w[redacted]d weeks    Plan:     1. Supervision of high risk pregnancy, antepartum Rx: - Cervicovaginal ancillary only - Culture, OB Urine - Hemoglobinopathy evaluation - Obstetric Panel, Including HIV - HgB A1c - Vitamin D (25 hydroxy) - Enroll Patient in Babyscripts - Prenatal Vit-Fe Fumarate-FA (PRENATAL PLUS/IRON) 27-1 MG TABS; Take 1 tablet by mouth daily before breakfast.  Dispense: 90 each; Refill: 4  2. Antepartum multigravida of advanced maternal age Rx: - AMB MFM GENETICS REFERRAL - AMB referral to maternal fetal medicine - [place] MFM Fetal Nuchal Translucency; Future - [place] MFM OB Comp Less 14 Wks; Future  3. S/P myomectomy - Cesarean Section recommended  4. Miscarriage within last 12 months  5. Graves' disease - taking Propylthiouracil ( PTU ).  Will start Methimazole in 2nd Trimester.   Prenatal vitamins.  Counseling provided regarding continued use of seat belts, cessation of alcohol consumption, smoking or use of illicit drugs; infection precautions i.e., influenza/TDAP immunizations, toxoplasmosis,CMV, parvovirus, listeria and varicella; workplace safety, exercise during pregnancy; routine dental care, safe medications, sexual activity, hot tubs, saunas, pools, travel, caffeine use, fish and methlymercury, potential toxins, hair treatments, varicose veins Weight gain recommendations per IOM guidelines reviewed: underweight/BMI< 18.5--> gain 28 - 40 lbs; normal weight/BMI 18.5 - 24.9--> gain 25 - 35 lbs; overweight/BMI 25 - 29.9--> gain 15 - 25 lbs; obese/BMI >30->gain  11 - 20 lbs Problem list reviewed and updated. FIRST/CF mutation testing/NIPT/QUAD SCREEN/fragile X/Ashkenazi Jewish population  testing/Spinal muscular atrophy discussed: requested. Role of ultrasound in pregnancy discussed; fetal survey: requested. Amniocentesis discussed: No.  Genetic counseling pending..   Meds ordered this encounter  Medications  . Prenatal Vit-Fe Fumarate-FA (PRENATAL PLUS/IRON) 27-1 MG TABS    Sig: Take 1 tablet by mouth daily before breakfast.    Dispense:  90 each    Refill:  4   Orders Placed This Encounter  Procedures  . Culture, OB Urine  . [place] MFM Fetal Nuchal Translucency    Standing Status:   Future    Standing Expiration Date:   09/08/2018    Order Specific Question:   Reason for Exam (SYMPTOM  OR DIAGNOSIS REQUIRED)    Answer:   AMA    Order Specific Question:   Preferred Imaging Location?    Answer:   [facility]trasound  . [place] MFM OB  Comp Less 14 Wks    Standing Status:   Future    Standing Expiration Date:   09/08/2018    Order Specific Question:   Reason for Exam (SYMPTOM  OR DIAGNOSIS REQUIRED)    Answer:   AMA.  Recent miscarriage    Order Specific Question:   Preferred Imaging Location?    Answer:   [facility]  . Hemoglobinopathy evaluation  . Obstetric Panel, Including HIV  . HgB A1c  . Vitamin D (25 hydroxy)  . AMB MFM GENETICS REFERRAL    Referral Priority:   Routine    Referral Type:   Consultation    Referral Reason:   Specialty Services Required    Number of Visits Requested:   1  . AMB referral to maternal fetal medicine    Referral Priority:   Routine    Referral Type:   Consultation    Referral Reason:   Specialty Services Required    Number of Visits Requested:   1    Follow up in 4 weeks. 50% of 25 min visit spent on counseling and coordination of care.     Shelly Bombard MD 07-08-2017

## 2017-07-08 NOTE — Progress Notes (Signed)
Patient is in the office for initial ob visit, fob with pt today. Pt states relationship status is complicated.

## 2017-07-09 LAB — OBSTETRIC PANEL, INCLUDING HIV
ANTIBODY SCREEN: NEGATIVE
BASOS ABS: 0 10*3/uL (ref 0.0–0.2)
BASOS: 0 %
EOS (ABSOLUTE): 0 10*3/uL (ref 0.0–0.4)
Eos: 0 %
HEMATOCRIT: 37.7 % (ref 34.0–46.6)
HEP B S AG: NEGATIVE
HIV SCREEN 4TH GENERATION: NONREACTIVE
Hemoglobin: 13 g/dL (ref 11.1–15.9)
IMMATURE GRANS (ABS): 0 10*3/uL (ref 0.0–0.1)
Immature Granulocytes: 0 %
LYMPHS: 35 %
Lymphocytes Absolute: 3.1 10*3/uL (ref 0.7–3.1)
MCH: 31.5 pg (ref 26.6–33.0)
MCHC: 34.5 g/dL (ref 31.5–35.7)
MCV: 91 fL (ref 79–97)
MONOCYTES: 8 %
Monocytes Absolute: 0.7 10*3/uL (ref 0.1–0.9)
NEUTROS ABS: 5.1 10*3/uL (ref 1.4–7.0)
Neutrophils: 57 %
Platelets: 313 10*3/uL (ref 150–379)
RBC: 4.13 x10E6/uL (ref 3.77–5.28)
RDW: 13.6 % (ref 12.3–15.4)
RPR: NONREACTIVE
RUBELLA: 19.2 {index} (ref >0.99)
Rh Factor: POSITIVE
WBC: 9.1 10*3/uL (ref 3.4–10.8)

## 2017-07-09 LAB — HEMOGLOBIN A1C
ESTIMATED AVERAGE GLUCOSE: 108 mg/dL
Hgb A1c MFr Bld: 5.4 % (ref 4.8–5.6)

## 2017-07-09 LAB — HEMOGLOBINOPATHY EVALUATION
HGB C: 0 %
HGB S: 0 %
HGB VARIANT: 0 %
Hemoglobin A2 Quantitation: 2.5 % (ref 1.8–3.2)
Hemoglobin F Quantitation: 0 % (ref 0.0–2.0)
Hgb A: 97.5 % (ref 96.4–98.8)

## 2017-07-09 LAB — CERVICOVAGINAL ANCILLARY ONLY
BACTERIAL VAGINITIS: NEGATIVE
CANDIDA VAGINITIS: NEGATIVE
CHLAMYDIA, DNA PROBE: NEGATIVE
NEISSERIA GONORRHEA: NEGATIVE
Trichomonas: NEGATIVE

## 2017-07-09 LAB — VITAMIN D 25 HYDROXY (VIT D DEFICIENCY, FRACTURES): Vit D, 25-Hydroxy: 36.3 ng/mL (ref 30.0–100.0)

## 2017-07-11 LAB — URINE CULTURE, OB REFLEX

## 2017-07-11 LAB — CULTURE, OB URINE

## 2017-07-23 ENCOUNTER — Encounter (HOSPITAL_COMMUNITY): Payer: Self-pay

## 2017-07-24 ENCOUNTER — Telehealth (HOSPITAL_COMMUNITY): Payer: Self-pay | Admitting: *Deleted

## 2017-07-24 ENCOUNTER — Ambulatory Visit (HOSPITAL_COMMUNITY)
Admission: RE | Admit: 2017-07-24 | Discharge: 2017-07-24 | Disposition: A | Discharge status: Home / Self Care | Payer: Managed Care, Other (non HMO) | Source: Ambulatory Visit | Attending: Obstetrics | Admitting: Obstetrics

## 2017-07-24 ENCOUNTER — Encounter (HOSPITAL_COMMUNITY): Payer: Self-pay

## 2017-07-24 ENCOUNTER — Other Ambulatory Visit: Payer: Self-pay | Admitting: Obstetrics

## 2017-07-24 DIAGNOSIS — O99211 Obesity complicating pregnancy, first trimester: Secondary | ICD-10-CM | POA: Diagnosis not present

## 2017-07-24 DIAGNOSIS — Z3A13 13 weeks gestation of pregnancy: Secondary | ICD-10-CM

## 2017-07-24 DIAGNOSIS — O09529 Supervision of elderly multigravida, unspecified trimester: Secondary | ICD-10-CM

## 2017-07-24 DIAGNOSIS — Z3682 Encounter for antenatal screening for nuchal translucency: Secondary | ICD-10-CM | POA: Insufficient documentation

## 2017-07-24 DIAGNOSIS — E079 Disorder of thyroid, unspecified: Secondary | ICD-10-CM | POA: Insufficient documentation

## 2017-07-24 DIAGNOSIS — O99281 Endocrine, nutritional and metabolic diseases complicating pregnancy, first trimester: Secondary | ICD-10-CM | POA: Diagnosis not present

## 2017-07-24 DIAGNOSIS — O09521 Supervision of elderly multigravida, first trimester: Secondary | ICD-10-CM | POA: Diagnosis not present

## 2017-07-24 NOTE — Addendum Note (Signed)
Encounter addended by: Corinne Ports on: 07/24/2017 3:37 PM  Actions taken: Sign clinical note

## 2017-07-24 NOTE — Progress Notes (Addendum)
Genetic Counseling  High-Risk Gestation Note  Appointment Date:  07/24/2017 Referred By: Shelly Bombard, MD Date of Birth:  May 20, 1980   Pregnancy History: G2P0010 Estimated Date of Delivery: 01/27/18 Estimated Gestational Age: [redacted]w[redacted]d Attending: Viann Fish, MD   Krista Hogan was seen for genetic counseling because of a maternal age of 37 y.o.. She will be 37 years old at delivery.     In summary:  Discussed AMA and associated risk for fetal aneuploidy  Discussed options for screening  First screen- declined  Quad screen- declined  NIPS- elected to pursue MaterniT21 today   Ultrasound- NT ultrasound performed today, see separate report  Discussed diagnostic testing options  [facility]- declined  Amniocentesis- declined  Reviewed family history concerns  Discussed carrier screening options  CF- drawn today ([facility])  SMA- patient expressed interest today but amount blood drawn not sufficient today; will plan to pursue at her next OB visit on 5/29  Hemoglobinopathies- within normal limits  She was counseled regarding maternal age and the association with risk for chromosome conditions due to nondisjunction with aging of the ova.   We reviewed chromosomes, nondisjunction, and the associated 1 in 42 risk for fetal aneuploidy at [redacted]w[redacted]d gestation related to a maternal age of 37 years old at delivery.  She was counseled that the risk for aneuploidy decreases as gestational age increases, accounting for those pregnancies which spontaneously abort.  We specifically discussed Down syndrome (trisomy 30), trisomies 12 and 87, and sex chromosome aneuploidies (47,XXX and 47,XXY) including the common features and prognoses of each.   We reviewed available screening options including First Screen, Quad screen, noninvasive prenatal screening (NIPS)/cell free DNA (cfDNA) screening, and detailed ultrasound.  She was counseled that screening tests are used to modify a patient's a priori  risk for aneuploidy, typically based on age. This estimate provides a pregnancy specific risk assessment. We reviewed the benefits and limitations of each option. Specifically, we discussed the conditions for which each test screens, the detection rates, and false positive rates of each. She was also counseled regarding diagnostic testing via [facility] and amniocentesis. We reviewed the approximate 1 in 811-914 risk for complications from amniocentesis, including spontaneous pregnancy loss. We discussed the possible results that the tests might provide including: positive, negative, unanticipated (for pregnancy and/or maternal factors), and no result. Finally, they were counseled regarding the cost of each option and potential out of pocket expenses. 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 After consideration of all the options, she elected to proceed with NIPS (MaterniT21 through [employer]) and declined maternal serum screening, [facility], and amniocentesis.  Those results will be available in 8-10 days.    A nuchal translucency ultrasound was performed today.  The report will be documented separately.  A detailed ultrasound is available at ~18+ weeks gestation. No follow-up appointments were made in our office at this time.  She understands that screening tests cannot rule out all birth defects or genetic syndromes. The patient was advised of this limitation and states she still does not want additional testing at this time.    Krista Hogan was provided with written information regarding cystic fibrosis (CF), spinal muscular atrophy (SMA) and hemoglobinopathies including the carrier frequency, availability of carrier screening and prenatal diagnosis if indicated.  In addition, we discussed that CF and hemoglobinopathies are routinely screened for as part of the [place] newborn screening panel, and screening for SMA is available under research protocol in [place] for parents who enroll in Early  Check.  After  further discussion, she elected to pursue screening for CF today, sent through [facility] lab [facility]). Patient also expressed interest to pursue SMA carrier screening. However, due to miscommunication from hospital lab, amount of blood drawn was not sufficient today to also run SMA carrier screening. Patient declined returning for blood draw and plans to pursue SMA carrier screen at next [facility] visit on 08/05/17. Hemoglobin electrophoresis was previously performed through her OB provider and was within normal limits.   Both family histories were reviewed and found to be noncontributory for birth defects, intellectual disability, and known genetic conditions. African American ancestry was reported for the couple, and the couple reported no known consanguinity. Without further information regarding the provided family history, an accurate genetic risk cannot be calculated. Further genetic counseling is warranted if more information is obtained.  Krista Hogan denied exposure to environmental toxins or chemical agents. She denied the use of street drugs. She reported alcohol and cigarette use prior to being aware of the pregnancy and discontinued exposure to both once becoming aware of the pregnancy after approximately [redacted] weeks gestation. The all-or-none period was discussed, meaning exposures that occur in the first 4 weeks of gestation are typically thought to either not affect the pregnancy at all or result in a miscarriage. She denied significant viral illnesses during the course of her pregnancy. Her medical history was significant for Graves disease, for which she is currently treated with medication.    I counseled Krista Hogan regarding the above risks and available options.  The approximate face-to-face time with the genetic counselor was 45 minutes.  Chipper Oman, MS,  Certified Genetic Counselor 07/24/2017

## 2017-07-24 NOTE — Telephone Encounter (Signed)
Spoke with patient regarding labs drawn earlier today. Discussed that the incorrect tubes were drawn for SMA carrier screening but the tube and amount of blood drawn is the amount required to proceed with cystic fibrosis carrier screening. After discussion of options, patient would like to proceed with CF carrier screening and will plan to pursue SMA carrier screening at her next OB visit. Discussed that this does not impact the NIPS (MaterniT21) lab that was drawn as that is a completely separate collection kit, and is being processed as expected. All questions answered to her satisfaction at this time.   Krista Hogan 07/24/2017 3:34 PM

## 2017-07-28 ENCOUNTER — Telehealth (HOSPITAL_COMMUNITY): Payer: Self-pay | Admitting: MS"

## 2017-07-28 NOTE — Telephone Encounter (Signed)
Attempted to contact patient regarding results of noninvasive prenatal screening (MaterniT21) drawn 07/24/17, which are within normal limits. Left message for patient to return call.   Krista Hogan  07/28/2017 12:01 PM

## 2017-07-29 ENCOUNTER — Other Ambulatory Visit (HOSPITAL_COMMUNITY): Payer: Self-pay

## 2017-07-31 ENCOUNTER — Telehealth (HOSPITAL_COMMUNITY): Payer: Self-pay | Admitting: Genetics

## 2017-07-31 NOTE — Telephone Encounter (Signed)
Called Rosamund A Smyre to discuss her prenatal cell free DNA screening results.  Mrs. Elaura A Markell had MaterniT21 screening through [place] was offered because of AMA.   The patient was identified by name and DOB.  We reviewed that these are within normal limits, showing a less than 1 in 10,000 risk for trisomies 21, 18 and 13, and monosomy X (Turner syndrome).  In addition, the risk for sex chromosome trisomies (47,XXX and 47,XXY) was also low risk. We reviewed that this testing identifies > 99% of pregnancies with trisomy 59, trisomy 43, sex chromosome trisomies (47,XXX and 47,XXY), and triploidy. The detection rate for trisomy 18 is 96%.  The detection rate for monosomy X is ~92%.  The false positive rate is <0.1% for all conditions. Testing was also consistent with female fetal sex.  The patient did wish to know fetal sex.  She understands that this testing does not identify all genetic conditions.  All questions were answered to her satisfaction, she was encouraged to call with additional questions or concerns.  Cam Hai, MS Certified Genetic Counselor

## 2017-08-04 LAB — CYSTIC FIBROSIS MUTATION 97: Interpretation: NOT DETECTED

## 2017-08-05 ENCOUNTER — Ambulatory Visit (INDEPENDENT_AMBULATORY_CARE_PROVIDER_SITE_OTHER): Payer: Managed Care, Other (non HMO) | Admitting: Obstetrics

## 2017-08-05 VITALS — BP 101/68 | HR 89 | Wt 221.1 lb

## 2017-08-05 DIAGNOSIS — O0992 Supervision of high risk pregnancy, unspecified, second trimester: Secondary | ICD-10-CM

## 2017-08-05 DIAGNOSIS — O09529 Supervision of elderly multigravida, unspecified trimester: Secondary | ICD-10-CM

## 2017-08-05 DIAGNOSIS — Z3482 Encounter for supervision of other normal pregnancy, second trimester: Secondary | ICD-10-CM

## 2017-08-05 DIAGNOSIS — O0990 Supervision of high risk pregnancy, unspecified, unspecified trimester: Secondary | ICD-10-CM

## 2017-08-05 DIAGNOSIS — Z9889 Other specified postprocedural states: Secondary | ICD-10-CM

## 2017-08-05 DIAGNOSIS — Z113 Encounter for screening for infections with a predominantly sexual mode of transmission: Secondary | ICD-10-CM | POA: Diagnosis not present

## 2017-08-05 DIAGNOSIS — O09522 Supervision of elderly multigravida, second trimester: Secondary | ICD-10-CM

## 2017-08-05 DIAGNOSIS — E0500 Thyrotoxicosis with diffuse goiter without thyrotoxic crisis or storm: Secondary | ICD-10-CM

## 2017-08-05 MED ORDER — CITRANATAL RX 27-1 MG PO TABS: 1.0000 | ORAL_TABLET | Freq: Every day | ORAL | 4 refills | Status: DC

## 2017-08-05 NOTE — Progress Notes (Signed)
Subjective:  Krista Hogan is a 37 y.o. G2P0010 at [redacted]w[redacted]d being seen today for ongoing prenatal care.  She is currently monitored for the following issues for this high-risk pregnancy and has Submucous leiomyoma of uterus; Chronic pelvic pain in female; Graves disease; Supervision of other normal pregnancy, antepartum; Antepartum multigravida of advanced maternal age; [redacted] weeks gestation of pregnancy; and Encounter for (NT) nuchal translucency scan on their problem list.  Patient reports no complaints.  Contractions: Irritability. Vag. Bleeding: None.  Movement: Absent. Denies leaking of fluid.   The following portions of the patient's history were reviewed and updated as appropriate: allergies, current medications, past family history, past medical history, past social history, past surgical history and problem list. Problem list updated.  Objective:   Vitals:   08/05/17 1040  BP: 101/68  Pulse: 89  Weight: 221 lb 1.6 oz (100.3 kg)    Fetal Status:     Movement: Absent     General:  Alert, oriented and cooperative. Patient is in no acute distress.  Skin: Skin is warm and dry. No rash noted.   Cardiovascular: Normal heart rate noted  Respiratory: Normal respiratory effort, no problems with respiration noted  Abdomen: Soft, gravid, appropriate for gestational age. Pain/Pressure: Absent     Pelvic:  Cervical exam deferred        Extremities: Normal range of motion.  Edema: None  Mental Status: Normal mood and affect. Normal behavior. Normal judgment and thought content.   Urinalysis:      Assessment and Plan:  Pregnancy: G2P0010 at [redacted]w[redacted]d  1. Supervision of high risk pregnancy, antepartum Rx: - AFP, Serum, Open Spina Bifida - Cervicovaginal ancillary only - [place] MFM OB DETAIL +14 WK; Future - Prenat w/o A-FeCb-FeGl-DSS-FA (CITRANATAL RX) 27-1 MG TABS; Take 1 tablet by mouth daily before breakfast.  Dispense: 90 each; Refill: 4  2. Antepartum multigravida of advanced maternal  age  11. S/P myomectomy - C/S recommended for delivery because invasive of myomectomy from serosa through mucosa for removal of fibroids  4. Graves' disease - stable, on PTU.  Will start Methimazole at ~ 16 weeks.  Managed by Endocrinology.   Preterm labor symptoms and general obstetric precautions including but not limited to vaginal bleeding, contractions, leaking of fluid and fetal movement were reviewed in detail with the patient. Please refer to After Visit Summary for other counseling recommendations.  Return in about 1 month (around 09/02/2017) for [place].   Shelly Bombard, MD

## 2017-08-06 ENCOUNTER — Other Ambulatory Visit: Payer: Self-pay

## 2017-08-06 LAB — CERVICOVAGINAL ANCILLARY ONLY
BACTERIAL VAGINITIS: NEGATIVE
CHLAMYDIA, DNA PROBE: NEGATIVE
Candida vaginitis: NEGATIVE
NEISSERIA GONORRHEA: NEGATIVE
Trichomonas: NEGATIVE

## 2017-08-11 ENCOUNTER — Telehealth: Payer: Self-pay | Admitting: *Deleted

## 2017-08-11 NOTE — Telephone Encounter (Signed)
Pt called to office with questions about irritation and Boric Acid.  Return call to pt.  Pt states that she normally uses Boric Acid after her cycles each month. Pt is now pregnant and still feels that she has vaginal irritation to the point she would like to use Boric Acid. Pt would like to know if there is anything else that she may use.  Pt was seen in office last week and had all Negative results.  Pt made aware that there is nothing at this time that she may be given.  Pt made aware that message will be sent to provider for further recommendations.    Please advise.

## 2017-08-12 NOTE — Telephone Encounter (Signed)
No further recommendations.

## 2017-08-13 ENCOUNTER — Encounter: Payer: Self-pay | Admitting: *Deleted

## 2017-08-13 NOTE — Telephone Encounter (Signed)
MyChart message sent to pt regarding call.

## 2017-08-19 LAB — AFP, SERUM, OPEN SPINA BIFIDA
AFP MoM: 2.73
AFP Value: 67.2 ng/mL
GEST. AGE ON COLLECTION DATE: 15 wk
Maternal Age At EDD: 36.4 yr
OSBR RISK 1 IN: 376
Test Results:: NEGATIVE
WEIGHT: 221 [lb_av]

## 2017-08-28 ENCOUNTER — Emergency Department: Payer: Managed Care, Other (non HMO)

## 2017-08-28 ENCOUNTER — Emergency Department
Admission: EM | Admit: 2017-08-28 | Discharge: 2017-08-28 | Disposition: A | Discharge status: Home / Self Care | Payer: Managed Care, Other (non HMO) | Attending: Emergency Medicine | Admitting: Emergency Medicine

## 2017-08-28 ENCOUNTER — Encounter: Payer: Self-pay | Admitting: Emergency Medicine

## 2017-08-28 DIAGNOSIS — Z79899 Other long term (current) drug therapy: Secondary | ICD-10-CM | POA: Diagnosis not present

## 2017-08-28 DIAGNOSIS — O209 Hemorrhage in early pregnancy, unspecified: Secondary | ICD-10-CM | POA: Insufficient documentation

## 2017-08-28 DIAGNOSIS — Z87891 Personal history of nicotine dependence: Secondary | ICD-10-CM | POA: Insufficient documentation

## 2017-08-28 DIAGNOSIS — N390 Urinary tract infection, site not specified: Secondary | ICD-10-CM

## 2017-08-28 DIAGNOSIS — Z3201 Encounter for pregnancy test, result positive: Secondary | ICD-10-CM | POA: Diagnosis not present

## 2017-08-28 DIAGNOSIS — Z3A19 19 weeks gestation of pregnancy: Secondary | ICD-10-CM | POA: Diagnosis not present

## 2017-08-28 DIAGNOSIS — E0500 Thyrotoxicosis with diffuse goiter without thyrotoxic crisis or storm: Secondary | ICD-10-CM | POA: Diagnosis not present

## 2017-08-28 DIAGNOSIS — R319 Hematuria, unspecified: Secondary | ICD-10-CM

## 2017-08-28 DIAGNOSIS — O99282 Endocrine, nutritional and metabolic diseases complicating pregnancy, second trimester: Secondary | ICD-10-CM | POA: Diagnosis not present

## 2017-08-28 DIAGNOSIS — O2342 Unspecified infection of urinary tract in pregnancy, second trimester: Secondary | ICD-10-CM | POA: Diagnosis not present

## 2017-08-28 LAB — URINALYSIS, COMPLETE (UACMP) WITH MICROSCOPIC
BILIRUBIN URINE: NEGATIVE
Bacteria, UA: NONE SEEN
Glucose, UA: NEGATIVE mg/dL
Ketones, ur: 80 mg/dL — AB
Nitrite: POSITIVE — AB
PH: 6 (ref 5.0–8.0)
Protein, ur: 100 mg/dL — AB
SPECIFIC GRAVITY, URINE: 1.02 (ref 1.005–1.030)
WBC, UA: >50 WBC/hpf — ABNORMAL HIGH (ref 0–5)

## 2017-08-28 LAB — POCT PREGNANCY, URINE: Preg Test, Ur: POSITIVE — AB

## 2017-08-28 MED ORDER — NITROFURANTOIN MONOHYD MACRO 100 MG PO CAPS
100.0000 mg | ORAL_CAPSULE | Freq: Once | ORAL | Status: AC
  Administered 2017-08-28: 100 mg via ORAL
  Filled 2017-08-28: qty 1

## 2017-08-28 MED ORDER — NITROFURANTOIN MONOHYD MACRO 100 MG PO CAPS: 100.0000 mg | ORAL_CAPSULE | Freq: Two times a day (BID) | ORAL | 0 refills | Status: AC

## 2017-08-28 NOTE — ED Triage Notes (Signed)
Patient presents to ED via POV from home with c/o vaginal bleeding that started today. Patient also reports dysuria. Patient denies saturating any pads. Patient reports two episodes of noticing blood after voiding (once it was pink and once it was bright red spots). Patient reports this is her first pregnancy.

## 2017-08-28 NOTE — Discharge Instructions (Addendum)
Take antibiotic as prescribed.  Please follow-up with your OB/GYN for recheck/reevaluation.  Drink plenty of fluids.  Return to the emergency department for any abdominal pain, fever, or any other symptom personally concerning to yourself.

## 2017-08-28 NOTE — ED Provider Notes (Signed)
[facility] Emergency Department Provider Note  Time seen: 9:54 PM  I have reviewed the triage vital signs and the nursing notes.   HISTORY  Chief Complaint Vaginal Bleeding    HPI Krista Hogan is a 37 y.o. female with a past medical history of DU B, approximately 17 or [redacted] weeks pregnant who presents to the emergency department for pain with urination, and noted blood in her urine.  According to the patient since this morning she has been experiencing dysuria states when she urinates she noticed some blood in the urine as well especially with wiping.  Denies any bleeding without urination.  Denies any abdominal pain, nausea, vomiting, diarrhea, fever.  Describes a burning sensation when urinating.  Denies any vaginal discharge.  Denies vaginal bleeding but does state bleeding when she urinates and wipes.  Past Medical History:  Diagnosis Date  . DUB (dysfunctional uterine bleeding)   . Graves disease 06/2016  . Graves disease   . History of abnormal cervical Pap smear    2011  . History of HPV infection   . Uterine fibroid   . Wears glasses     Patient Active Problem List   Diagnosis Date Noted  . Antepartum multigravida of advanced maternal age   . [redacted] weeks gestation of pregnancy   . Encounter for (NT) nuchal translucency scan   . Supervision of other normal pregnancy, antepartum 07/08/2017  . Graves disease 10/01/2016  . Chronic pelvic pain in female 11/15/2015  . 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    Past Surgical History:  Procedure Laterality Date  . EXCISION OF SKIN TAG  11/22/2014   Procedure: EXCISION OF SKIN TAG VULVA;  Surgeon: Governor Specking, MD;  Location: [place];  Service: Gynecology;;  . LAPAROSCOPIC GELPORT ASSISTED MYOMECTOMY N/A 11/22/2014   Procedure: LAPAROSCOPIC GELPORT ASSISTED MYOMECTOMY WITH CHROMOTUBATION;  Surgeon: Governor Specking, MD;  Location: [place];  Service: Gynecology;   Laterality: N/A;  . LAPAROSCOPY N/A 11/22/2014   Procedure: LAPAROSCOPY DIAGNOSTIC;  Surgeon: Governor Specking, MD;  Location: [place];  Service: Gynecology;  Laterality: N/A;  . LEEP  2011  . TONSILLECTOMY AND ADENOIDECTOMY  age 79  . WISDOM TOOTH EXTRACTION  2006    Prior to Admission medications   Medication Sig Start Date End Date Taking? Authorizing Provider  cetirizine (ZYRTEC) 10 MG tablet Take 10 mg by mouth daily.    [provider]  cholecalciferol (VITAMIN D) 1000 units tablet Take 1,000 Units by mouth daily.    [provider]  cyanocobalamin 1000 MCG tablet Take 1,000 mcg by mouth daily.    [provider]  Omega-3 Fatty Acids (FISH OIL) 1000 MG CAPS Take 1 capsule by mouth daily.    [provider]  Prenat w/o A-FeCb-FeGl-DSS-FA (CITRANATAL RX) 27-1 MG TABS Take 1 tablet by mouth daily before breakfast. 08/05/17   Shelly Bombard, MD  Prenatal Vit-Fe Fumarate-FA (PRENATAL PLUS/IRON) 27-1 MG TABS Take 1 tablet by mouth daily before breakfast. 07/08/17   Shelly Bombard, MD  propylthiouracil (PTU) 50 MG tablet Take 1 tablet (50 mg total) by mouth 3 (three) times daily. 06/25/17   Philemon Kingdom, MD    No Known Allergies  Family History  Problem Relation Age of Onset  . Fibroids Mother   . Thyroid disease Neg Hx     Social History Social History   Tobacco Use  . Smoking status: Former Smoker    Packs/day: 0.25  Years: 10.00    Pack years: 2.50    Types: Cigarettes    Last attempt to quit: 04/2017    Years since quitting: 0.3  . Smokeless tobacco: Never Used  Substance Use Topics  . Alcohol use: Yes    Alcohol/week: 0.0 oz    Comment: SOCIAL  . Drug use: No    Review of Systems Constitutional: Negative for fever. Cardiovascular: Negative for chest pain. Respiratory: Negative for shortness of breath. Gastrointestinal: Negative for abdominal pain, vomiting and diarrhea. Genitourinary: Positive for  dysuria and hematuria. Musculoskeletal: Negative for musculoskeletal complaints Skin: Negative for skin complaints  Neurological: Negative for headache All other ROS negative  ____________________________________________   PHYSICAL EXAM:  VITAL SIGNS: ED Triage Vitals  Enc Vitals Group     BP 08/28/17 1842 127/73     Pulse Rate 08/28/17 1842 99     Resp 08/28/17 1842 20     Temp 08/28/17 1842 98.5 F (36.9 C)     Temp Source 08/28/17 1842 Oral     SpO2 08/28/17 1842 100 %     Weight 08/28/17 1852 210 lb (95.3 kg)     Height 08/28/17 1852 5\' 3"  (1.6 m)     Head Circumference --      Peak Flow --      Pain Score 08/28/17 1852 0     Pain Loc --      Pain Edu? --      Excl. in [place]? --    Constitutional: Alert and oriented. Well appearing and in no distress. Eyes: Normal exam ENT   Head: Normocephalic and atraumatic.   Mouth/Throat: Mucous membranes are moist. Cardiovascular: Normal rate, regular rhythm. No murmur Respiratory: Normal respiratory effort without tachypnea nor retractions. Breath sounds are clear  Gastrointestinal: Soft and nontender. No distention.  Musculoskeletal: Nontender with normal range of motion in all extremities.  Neurologic:  Normal speech and language. No gross focal neurologic deficits  Skin:  Skin is warm, dry and intact.  Psychiatric: Mood and affect are normal.   ____________________________________________   RADIOLOGY  Ultrasound shows single viable intrauterine pregnancy at 19 weeks and 0 days, normal heart rate 137 bpm.  ____________________________________________   INITIAL IMPRESSION / ASSESSMENT AND PLAN / ED COURSE  Pertinent labs & imaging results that were available during my care of the patient were reviewed by me and considered in my medical decision making (see chart for details).  Patient presents to the emergency department for dysuria and hematuria since this morning.  Nontender abdomen, no abdominal pain.   Differential include urinary tract infection, pelvic infection, pregnancy abnormality.  Reassuringly patient's ultrasound is normal.  Patient's urinalysis does show many white cells as well as red cells and nitrite positive.  Highly suspect urinary tract infection.  We will treat with Macrobid.  Patient denies any vaginal discharge or vaginal bleeding.  Clearly states the only blood she notices when she urinates, urinalysis also shows blood.  Do not suspect threatened miscarriage and we will treat with antibiotics and have the patient follow-up with her doctor/OB/GYN.  Patient agreeable to plan of care.  ____________________________________________   FINAL CLINICAL IMPRESSION(S) / ED DIAGNOSES  Urinary tract infection    Harvest Dark, MD 08/28/17 2200

## 2017-09-02 ENCOUNTER — Other Ambulatory Visit (HOSPITAL_COMMUNITY): Payer: Managed Care, Other (non HMO)

## 2017-09-02 ENCOUNTER — Encounter: Payer: Managed Care, Other (non HMO) | Admitting: Obstetrics

## 2017-09-07 ENCOUNTER — Ambulatory Visit (HOSPITAL_COMMUNITY)
Admission: RE | Admit: 2017-09-07 | Discharge: 2017-09-07 | Disposition: A | Discharge status: Home / Self Care | Payer: Managed Care, Other (non HMO) | Source: Ambulatory Visit | Attending: Obstetrics | Admitting: Obstetrics

## 2017-09-07 ENCOUNTER — Other Ambulatory Visit (HOSPITAL_COMMUNITY): Payer: Self-pay | Admitting: *Deleted

## 2017-09-07 ENCOUNTER — Other Ambulatory Visit: Payer: Self-pay | Admitting: Obstetrics

## 2017-09-07 ENCOUNTER — Encounter (HOSPITAL_COMMUNITY): Payer: Self-pay

## 2017-09-07 ENCOUNTER — Encounter: Payer: Self-pay | Admitting: Obstetrics

## 2017-09-07 ENCOUNTER — Ambulatory Visit (INDEPENDENT_AMBULATORY_CARE_PROVIDER_SITE_OTHER): Payer: Managed Care, Other (non HMO) | Admitting: Obstetrics

## 2017-09-07 ENCOUNTER — Other Ambulatory Visit: Payer: Self-pay

## 2017-09-07 VITALS — BP 111/68 | HR 104 | Temp 98.3°F | Wt 225.2 lb

## 2017-09-07 DIAGNOSIS — Z9889 Other specified postprocedural states: Secondary | ICD-10-CM

## 2017-09-07 DIAGNOSIS — O3429 Maternal care due to uterine scar from other previous surgery: Secondary | ICD-10-CM

## 2017-09-07 DIAGNOSIS — O0990 Supervision of high risk pregnancy, unspecified, unspecified trimester: Secondary | ICD-10-CM

## 2017-09-07 DIAGNOSIS — O99212 Obesity complicating pregnancy, second trimester: Secondary | ICD-10-CM | POA: Insufficient documentation

## 2017-09-07 DIAGNOSIS — E079 Disorder of thyroid, unspecified: Secondary | ICD-10-CM | POA: Diagnosis not present

## 2017-09-07 DIAGNOSIS — O0992 Supervision of high risk pregnancy, unspecified, second trimester: Secondary | ICD-10-CM

## 2017-09-07 DIAGNOSIS — Z87798 Personal history of other (corrected) congenital malformations: Secondary | ICD-10-CM | POA: Diagnosis not present

## 2017-09-07 DIAGNOSIS — Z3A19 19 weeks gestation of pregnancy: Secondary | ICD-10-CM | POA: Diagnosis not present

## 2017-09-07 DIAGNOSIS — O99282 Endocrine, nutritional and metabolic diseases complicating pregnancy, second trimester: Principal | ICD-10-CM

## 2017-09-07 DIAGNOSIS — Z363 Encounter for antenatal screening for malformations: Secondary | ICD-10-CM

## 2017-09-07 DIAGNOSIS — O09522 Supervision of elderly multigravida, second trimester: Secondary | ICD-10-CM

## 2017-09-07 DIAGNOSIS — O09529 Supervision of elderly multigravida, unspecified trimester: Secondary | ICD-10-CM

## 2017-09-07 DIAGNOSIS — E0590 Thyrotoxicosis, unspecified without thyrotoxic crisis or storm: Secondary | ICD-10-CM

## 2017-09-07 DIAGNOSIS — Z8759 Personal history of other complications of pregnancy, childbirth and the puerperium: Secondary | ICD-10-CM

## 2017-09-07 NOTE — Progress Notes (Signed)
ROB.  C/o having a cold, cough, headache x 2 days.  She got treated at [facility] ED for UTI, she took the last pill on Sunday (yesterday).

## 2017-09-07 NOTE — Progress Notes (Signed)
Subjective:  Krista Hogan is a 37 y.o. G2P0010 at [redacted]w[redacted]d being seen today for ongoing prenatal care.  She is currently monitored for the following issues for this high-risk pregnancy and has Submucous leiomyoma of uterus; Chronic pelvic pain in female; Graves disease; Supervision of other normal pregnancy, antepartum; Antepartum multigravida of advanced maternal age; [redacted] weeks gestation of pregnancy; and Encounter for (NT) nuchal translucency scan on their problem list.  Patient reports cough and congestion.  Contractions: Not present. Vag. Bleeding: None.   . Denies leaking of fluid.   The following portions of the patient's history were reviewed and updated as appropriate: allergies, current medications, past family history, past medical history, past social history, past surgical history and problem list. Problem list updated.  Objective:   Vitals:   09/07/17 1050  BP: 111/68  Pulse: (!) 104  Temp: 98.3 F (36.8 C)  Weight: 225 lb 3.2 oz (102.2 kg)    Fetal Status: Fetal Heart Rate (bpm): 150         General:  Alert, oriented and cooperative. Patient is in no acute distress.  Skin: Skin is warm and dry. No rash noted.   Cardiovascular: Normal heart rate noted  Respiratory: Normal respiratory effort, no problems with respiration noted  Abdomen: Soft, gravid, appropriate for gestational age. Pain/Pressure: Present     Pelvic:  Cervical exam deferred        Extremities: Normal range of motion.  Edema: None  Mental Status: Normal mood and affect. Normal behavior. Normal judgment and thought content.   Urinalysis:      Assessment and Plan:  Pregnancy: G2P0010 at [redacted]w[redacted]d  1. Supervision of high risk pregnancy, antepartum  2. Antepartum multigravida of advanced maternal age  33. S/P myomectomy  4. Miscarriage within last 12 months   Preterm labor symptoms and general obstetric precautions including but not limited to vaginal bleeding, contractions, leaking of fluid and fetal  movement were reviewed in detail with the patient. Please refer to After Visit Summary for other counseling recommendations.  Return in about 1 month (around 10/05/2017) for [place].   Shelly Bombard, MD

## 2017-09-30 ENCOUNTER — Telehealth: Payer: Self-pay

## 2017-09-30 NOTE — Telephone Encounter (Signed)
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 Pt made aware of comfort measures with tylenol , and to increase water intake, and warm compress to area I also made pt aware a belly band may be beneficial. Please advise.

## 2017-10-05 ENCOUNTER — Ambulatory Visit (INDEPENDENT_AMBULATORY_CARE_PROVIDER_SITE_OTHER): Payer: Managed Care, Other (non HMO) | Admitting: Obstetrics

## 2017-10-05 ENCOUNTER — Ambulatory Visit (HOSPITAL_COMMUNITY)
Admission: RE | Admit: 2017-10-05 | Discharge: 2017-10-05 | Disposition: A | Discharge status: Home / Self Care | Payer: Managed Care, Other (non HMO) | Source: Ambulatory Visit | Attending: Obstetrics | Admitting: Obstetrics

## 2017-10-05 ENCOUNTER — Other Ambulatory Visit (HOSPITAL_COMMUNITY): Payer: Self-pay | Admitting: *Deleted

## 2017-10-05 ENCOUNTER — Encounter: Payer: Self-pay | Admitting: Obstetrics

## 2017-10-05 ENCOUNTER — Encounter (HOSPITAL_COMMUNITY): Payer: Self-pay

## 2017-10-05 VITALS — BP 110/71 | HR 92 | Wt 222.8 lb

## 2017-10-05 DIAGNOSIS — O99282 Endocrine, nutritional and metabolic diseases complicating pregnancy, second trimester: Secondary | ICD-10-CM | POA: Insufficient documentation

## 2017-10-05 DIAGNOSIS — E0590 Thyrotoxicosis, unspecified without thyrotoxic crisis or storm: Secondary | ICD-10-CM | POA: Diagnosis present

## 2017-10-05 DIAGNOSIS — O99212 Obesity complicating pregnancy, second trimester: Secondary | ICD-10-CM

## 2017-10-05 DIAGNOSIS — Z362 Encounter for other antenatal screening follow-up: Secondary | ICD-10-CM

## 2017-10-05 DIAGNOSIS — M5442 Lumbago with sciatica, left side: Secondary | ICD-10-CM

## 2017-10-05 DIAGNOSIS — Z3A23 23 weeks gestation of pregnancy: Secondary | ICD-10-CM | POA: Insufficient documentation

## 2017-10-05 DIAGNOSIS — O0992 Supervision of high risk pregnancy, unspecified, second trimester: Secondary | ICD-10-CM

## 2017-10-05 DIAGNOSIS — O3429 Maternal care due to uterine scar from other previous surgery: Secondary | ICD-10-CM

## 2017-10-05 DIAGNOSIS — O0990 Supervision of high risk pregnancy, unspecified, unspecified trimester: Secondary | ICD-10-CM

## 2017-10-05 DIAGNOSIS — O99283 Endocrine, nutritional and metabolic diseases complicating pregnancy, third trimester: Principal | ICD-10-CM

## 2017-10-05 DIAGNOSIS — G8929 Other chronic pain: Secondary | ICD-10-CM

## 2017-10-05 MED ORDER — CYCLOBENZAPRINE HCL 10 MG PO TABS
10.0000 mg | ORAL_TABLET | Freq: Three times a day (TID) | ORAL | 1 refills | Status: DC | PRN
Start: 2017-10-05 — End: 2017-12-05

## 2017-10-05 MED ORDER — COMFORT FIT MATERNITY SUPP SM MISC: 0 refills | Status: DC

## 2017-10-05 NOTE — Progress Notes (Signed)
Subjective:  Krista Hogan is a 37 y.o. G2P0010 at [redacted]w[redacted]d being seen today for ongoing prenatal care.  She is currently monitored for the following issues for this high-risk pregnancy and has Submucous leiomyoma of uterus; Chronic pelvic pain in female; Graves disease; Supervision of other normal pregnancy, antepartum; Antepartum multigravida of advanced maternal age; [redacted] weeks gestation of pregnancy; and Encounter for (NT) nuchal translucency scan on their problem list.  Patient reports bleeding and left sided sciatica.  Contractions: Not present. Vag. Bleeding: None.  Movement: Present. Denies leaking of fluid.   The following portions of the patient's history were reviewed and updated as appropriate: allergies, current medications, past family history, past medical history, past social history, past surgical history and problem list. Problem list updated.  Objective:   Vitals:   10/05/17 1039  BP: 110/71  Pulse: 92  Weight: 222 lb 12.8 oz (101.1 kg)    Fetal Status: Fetal Heart Rate (bpm): 150   Movement: Present     General:  Alert, oriented and cooperative. Patient is in no acute distress.  Skin: Skin is warm and dry. No rash noted.   Cardiovascular: Normal heart rate noted  Respiratory: Normal respiratory effort, no problems with respiration noted  Abdomen: Soft, gravid, appropriate for gestational age. Pain/Pressure: Absent     Pelvic:  Cervical exam deferred        Extremities: Normal range of motion.  Edema: None  Mental Status: Normal mood and affect. Normal behavior. Normal judgment and thought content.   Urinalysis:      Assessment and Plan:  Pregnancy: G2P0010 at [redacted]w[redacted]d  1. Supervision of high risk pregnancy, antepartum  2. Chronic low back pain with left-sided sciatica, unspecified back pain laterality Rx: - Elastic Bandages & Supports (COMFORT FIT MATERNITY SUPP SM) MISC; Wear as directed.  Dispense: 1 each; Refill: 0 - cyclobenzaprine (FLEXERIL) 10 MG tablet; Take  1 tablet (10 mg total) by mouth 3 (three) times daily as needed for muscle spasms.  Dispense: 30 tablet; Refill: 1  Preterm labor symptoms and general obstetric precautions including but not limited to vaginal bleeding, contractions, leaking of fluid and fetal movement were reviewed in detail with the patient. Please refer to After Visit Summary for other counseling recommendations.  Return in about 1 month (around 11/02/2017) for ROB, 2 hour OGTT.   Shelly Bombard, MD

## 2017-10-05 NOTE — Progress Notes (Signed)
Pt complains of having pains in her left. She states that is hard to walk. She states that pain has been present for a couple of weeks

## 2017-10-06 ENCOUNTER — Other Ambulatory Visit: Payer: Self-pay

## 2017-10-06 ENCOUNTER — Other Ambulatory Visit: Payer: Self-pay | Admitting: Internal Medicine

## 2017-10-06 DIAGNOSIS — E0590 Thyrotoxicosis, unspecified without thyrotoxic crisis or storm: Secondary | ICD-10-CM

## 2017-10-06 DIAGNOSIS — E0500 Thyrotoxicosis with diffuse goiter without thyrotoxic crisis or storm: Secondary | ICD-10-CM

## 2017-10-06 NOTE — Telephone Encounter (Signed)
OK, I think this is ok.

## 2017-10-06 NOTE — Telephone Encounter (Signed)
Pt has a lab appointment for in the morning she could not come in today.

## 2017-10-06 NOTE — Telephone Encounter (Signed)
Is this okay to refill? Per last OV she was to stop once entering 2nd trimester, she is now 23w

## 2017-10-06 NOTE — Telephone Encounter (Signed)
We need labs firs, then change to MMI >> can she come today for a TSH, fT4 and fT3? Can you plz order these?

## 2017-10-07 ENCOUNTER — Telehealth: Payer: Self-pay | Admitting: Internal Medicine

## 2017-10-07 ENCOUNTER — Other Ambulatory Visit (INDEPENDENT_AMBULATORY_CARE_PROVIDER_SITE_OTHER): Payer: Managed Care, Other (non HMO)

## 2017-10-07 DIAGNOSIS — E0500 Thyrotoxicosis with diffuse goiter without thyrotoxic crisis or storm: Secondary | ICD-10-CM | POA: Diagnosis not present

## 2017-10-07 DIAGNOSIS — E0590 Thyrotoxicosis, unspecified without thyrotoxic crisis or storm: Secondary | ICD-10-CM | POA: Diagnosis not present

## 2017-10-07 LAB — TSH: TSH: 1.28 u[IU]/mL (ref 0.35–4.50)

## 2017-10-07 LAB — T4, FREE: Free T4: 0.62 ng/dL (ref 0.60–1.60)

## 2017-10-07 LAB — T3, FREE: T3, Free: 4.3 pg/mL — ABNORMAL HIGH (ref 2.3–4.2)

## 2017-10-07 NOTE — Telephone Encounter (Signed)
Patient stated they are returning a call from the office. Please advise

## 2017-10-07 NOTE — Telephone Encounter (Signed)
Spoke with pt, in labs.

## 2017-10-27 ENCOUNTER — Other Ambulatory Visit: Payer: Self-pay

## 2017-10-27 DIAGNOSIS — O219 Vomiting of pregnancy, unspecified: Secondary | ICD-10-CM

## 2017-10-27 MED ORDER — ONDANSETRON 8 MG PO TBDP: 8.0000 mg | ORAL_TABLET | Freq: Three times a day (TID) | ORAL | 0 refills | Status: DC | PRN

## 2017-10-27 NOTE — Progress Notes (Signed)
Pt called stating she starting having nausea and vomiting this morning when she woke up and has not been able to keep any food or liquids down all day. Pt denies any cramps/contractions/bleeding, states she has felt baby move. Dr Jodi Mourning advised sending zofran 8mg  to pts pharmacy, rx sent. Pt aware of rx and told to try taking medication then seeing if she is able to keep liquids down (ie gatorade, ginger ale, broth, water). Advised pt that if she is not able to tolerate keeping liquids down after taking medication [facility] may need IV hydration/meds. Pt verbalized understanding.

## 2017-11-02 ENCOUNTER — Encounter: Payer: Self-pay | Admitting: Internal Medicine

## 2017-11-02 ENCOUNTER — Ambulatory Visit: Payer: Managed Care, Other (non HMO) | Admitting: Internal Medicine

## 2017-11-02 VITALS — BP 100/60 | HR 94 | Ht 64.0 in | Wt 227.0 lb

## 2017-11-02 DIAGNOSIS — E0500 Thyrotoxicosis with diffuse goiter without thyrotoxic crisis or storm: Secondary | ICD-10-CM | POA: Diagnosis not present

## 2017-11-02 LAB — T3, FREE: T3 FREE: 4 pg/mL (ref 2.3–4.2)

## 2017-11-02 LAB — T4, FREE: FREE T4: 0.65 ng/dL (ref 0.60–1.60)

## 2017-11-02 LAB — TSH: TSH: 1.17 u[IU]/mL (ref 0.35–4.50)

## 2017-11-02 NOTE — Progress Notes (Signed)
Patient ID: Krista Hogan, female   DOB: 1980/04/07, 37 y.o.   MRN: [id number]    HPI  Krista Hogan is a 37 y.o.-year-old female, returning for f/u for Graves ds. She previously saw Dr. Loanne Hogan, last visit 11/2015. Last visit with me 4 months ago.  She is pregnant, 6 months along.  She is doing November.  She is feeling well, but lately she had some nausea or vomiting.  Reviewed and addended history: She was dx'ed with thyrotoxicosis in summer 2017. She was Rx'ed MMI >> initially refused 2/2 concern for poss. weight gain   After I saw her, we checked labs which confirmed Graves' disease so he started the low-dose methimazole, 2.5 mg daily.  She initially developed headaches, but then she realized that this could have been due to allergies.  Due to the plans for pregnancy, we switched to a low-dose PTU, 50 mg daily.  We did discuss at that time that at the beginning of the second trimester, she will need to switch back to methimazole, but she did not contact me when she entered the second trimester, therefore, she continued PTU until set of labs in 09/2017.    Since the tests were excellent, we stopped PTU at that time.  Reviewed patient's TFTs: Lab Results  Component Value Date   TSH 1.28 10/07/2017   TSH 0.39 06/25/2017   TSH 0.50 01/01/2017   TSH 0.00 (L) 09/23/2016   TSH 0.01 (L) 10/20/2015   FREET4 0.62 10/07/2017   FREET4 0.72 06/25/2017   FREET4 0.73 01/01/2017   FREET4 1.05 09/23/2016   FREET4 2.0 (H) 10/20/2015  08/2016: TSH 0.011 (received after appt)  We diagnosed Graves' disease based on disease course and also elevated TSI antibodies: Lab Results  Component Value Date   TSI 273 (H) 09/23/2016   Pt denies: - feeling nodules in neck - hoarseness - dysphagia - choking - SOB with lying downn  Pt does have a FH of thyroid ds. In cousin  No FH of thyroid cancer. No h/o radiation tx to head or neck.  No seaweed or kelp. No recent contrast studies. No herbal  supplements. + Biotin use (not for last 4 days). No recent steroids use.   Pt. also has a history of myomectomy.  ROS: Constitutional: no weight gain/no weight loss, no fatigue, no subjective hyperthermia, no subjective hypothermia Eyes: no blurry vision, no xerophthalmia ENT: no sore throat, + see HPI Cardiovascular: no CP/no SOB/no palpitations/no leg swelling Respiratory: no cough/no SOB/no wheezing Gastrointestinal: + N/+ V/no D/no C/no acid reflux Musculoskeletal: no muscle aches/no joint aches Skin: no rashes, no hair loss Neurological: no tremors/no numbness/no tingling/no dizziness  I reviewed pt's medications, allergies, PMH, social hx, family hx, and changes were documented in the history of present illness. Otherwise, unchanged from my initial visit note.  Past Medical History:  Diagnosis Date  . DUB (dysfunctional uterine bleeding)   . Graves disease 06/2016  . Graves disease   . History of abnormal cervical Pap smear    2011  . History of HPV infection   . Uterine fibroid   . Wears glasses    Past Surgical History:  Procedure Laterality Date  . EXCISION OF SKIN TAG  11/22/2014   Procedure: EXCISION OF SKIN TAG VULVA;  Surgeon: Governor Specking, MD;  Location: [place];  Service: Gynecology;;  . LAPAROSCOPIC GELPORT ASSISTED MYOMECTOMY N/A 11/22/2014   Procedure: LAPAROSCOPIC GELPORT ASSISTED MYOMECTOMY WITH CHROMOTUBATION;  Surgeon: Governor Specking, MD;  Location: [place];  Service: Gynecology;  Laterality: N/A;  . LAPAROSCOPY N/A 11/22/2014   Procedure: LAPAROSCOPY DIAGNOSTIC;  Surgeon: Governor Specking, MD;  Location: [place];  Service: Gynecology;  Laterality: N/A;  . LEEP  2011  . TONSILLECTOMY AND ADENOIDECTOMY  age 37  . WISDOM TOOTH EXTRACTION  2006   Social History   Social History  . Marital status: Single    Spouse name: N/A  . Number of children: 0   Occupational History  . Lab tech   Social  History Main Topics  . Smoking status: Current Some Day Smoker    Packs/day: 0.25    Years: 10.00    Types: Cigarettes  . Smokeless tobacco: Never Used  . Alcohol use 0.0 oz/week     Comment: SOCIAL  . Drug use: No  . Sexual activity: Yes    Birth control/ protection: None   Current Outpatient Medications on File Prior to Visit  Medication Sig Dispense Refill  . cetirizine (ZYRTEC) 10 MG tablet Take 10 mg by mouth daily.    . cholecalciferol (VITAMIN D) 1000 units tablet Take 1,000 Units by mouth daily.    . cyanocobalamin 1000 MCG tablet Take 1,000 mcg by mouth daily.    . cyclobenzaprine (FLEXERIL) 10 MG tablet Take 1 tablet (10 mg total) by mouth 3 (three) times daily as needed for muscle spasms. 30 tablet 1  . Elastic Bandages & Supports (COMFORT FIT MATERNITY SUPP SM) MISC Wear as directed. 1 each 0  . Omega-3 Fatty Acids (FISH OIL) 1000 MG CAPS Take 1 capsule by mouth daily.    . ondansetron (ZOFRAN ODT) 8 MG disintegrating tablet Take 1 tablet (8 mg total) by mouth every 8 (eight) hours as needed for nausea or vomiting. 20 tablet 0  . Prenat w/o A-FeCb-FeGl-DSS-FA (CITRANATAL RX) 27-1 MG TABS Take 1 tablet by mouth daily before breakfast. 90 each 4  . Prenatal Vit-Fe Fumarate-FA (PRENATAL PLUS/IRON) 27-1 MG TABS Take 1 tablet by mouth daily before breakfast. 90 each 4  . propylthiouracil (PTU) 50 MG tablet Take 1 tablet (50 mg total) by mouth 3 (three) times daily. 30 tablet 1   No current facility-administered medications on file prior to visit.    No Known Allergies Family History  Problem Relation Age of Onset  . Fibroids Mother   . Thyroid disease Neg Hx    PE: BP 100/60   Pulse 94   Ht 5\' 4"  (1.626 m)   Wt 227 lb (103 kg)   LMP 04/22/2017 (LMP Unknown)   SpO2 98%   BMI 38.96 kg/m  Wt Readings from Last 3 Encounters:  11/02/17 227 lb (103 kg)  10/05/17 223 lb 12.8 oz (101.5 kg)  10/05/17 222 lb 12.8 oz (101.1 kg)   Constitutional: overweight, in NAD Eyes:  PERRLA, EOMI, no exophthalmos ENT: moist mucous membranes, + mild thyromegaly, no cervical lymphadenopathy Cardiovascular: tachycardia, RR, No MRG Respiratory: CTA B Gastrointestinal: abdomen soft, NT, ND, BS+ Musculoskeletal: no deformities, strength intact in all 4 Skin: moist, warm, no rashes Neurological: no tremor with outstretched hands, DTR normal in all 4  ASSESSMENT: 1. Graves ds  PLAN:  1. Patient with a h/o Graves ds. dx'ed based on course of the disease and elevated TSI antibodies.  Therefore, we did not perform thyroid uptake and scan for diagnosis. - she is now pregnant in 6 months - We switched her to PTU after she found out she was pregnant - Her tests were  maintained in the target range during the pregnancy and we were able to stop PTU completely less than a month ago - She denies hyperthyroid symptoms - We will recheck her TFTs today and we discussed about targets for treatment, with a TSH lower than 3.  We will also add TSI's - We discussed that if we need to start treatment for her hyperthyroidism, from now on, we will use methimazole rather than PTU - She is tachycardic today, but this could be due to pregnancy.  We will not start a beta-blocker for now. - I will see her back in 6 months, but I will ask her to come back for test 1 month after she gives birth  - time spent with the patient: 15 min, of which >50% was spent in obtaining information about her symptoms, reviewing her previous labs, evaluations, and treatments, counseling her about her condition (please see the discussed topics above), and developing a plan to further investigate and treat it.  Component     Latest Ref Rng & Units 11/02/2017  TSH     0.35 - 4.50 uIU/mL 1.17  T4,Free(Direct)     0.60 - 1.60 ng/dL 0.65  Triiodothyronine,Free,Serum     2.3 - 4.2 pg/mL 4.0  TSI     <140 % baseline 126  Thyroid tests are excellent in the TSI antibody level is normal.  We can continue off thionamides at  this point.  Philemon Kingdom, MD PhD [facility] Endocrinology

## 2017-11-02 NOTE — Patient Instructions (Signed)
Please stop at the lab.  Please come back for a follow-up appointment in 6 months, but for labs 1 month after you give birth.

## 2017-11-03 ENCOUNTER — Encounter: Payer: Self-pay | Admitting: Obstetrics

## 2017-11-03 ENCOUNTER — Other Ambulatory Visit: Payer: Managed Care, Other (non HMO)

## 2017-11-03 ENCOUNTER — Ambulatory Visit (INDEPENDENT_AMBULATORY_CARE_PROVIDER_SITE_OTHER): Payer: Managed Care, Other (non HMO) | Admitting: Obstetrics

## 2017-11-03 VITALS — BP 97/62 | HR 91 | Wt 224.2 lb

## 2017-11-03 DIAGNOSIS — Z3482 Encounter for supervision of other normal pregnancy, second trimester: Secondary | ICD-10-CM

## 2017-11-03 DIAGNOSIS — Z9889 Other specified postprocedural states: Secondary | ICD-10-CM | POA: Diagnosis not present

## 2017-11-03 DIAGNOSIS — Z23 Encounter for immunization: Secondary | ICD-10-CM | POA: Diagnosis not present

## 2017-11-03 DIAGNOSIS — Z3480 Encounter for supervision of other normal pregnancy, unspecified trimester: Secondary | ICD-10-CM

## 2017-11-03 NOTE — Progress Notes (Signed)
Subjective:  Krista Hogan is a 37 y.o. G2P0010 at [redacted]w[redacted]d being seen today for ongoing prenatal care.  She is currently monitored for the following issues for this high-risk pregnancy and has Submucous leiomyoma of uterus; Chronic pelvic pain in female; Graves disease; Supervision of other normal pregnancy, antepartum; Antepartum multigravida of advanced maternal age; [redacted] weeks gestation of pregnancy; and Encounter for (NT) nuchal translucency scan on their problem list.  Patient reports " mole " on labia .  Contractions: Not present. Vag. Bleeding: None.  Movement: Present. Denies leaking of fluid.   The following portions of the patient's history were reviewed and updated as appropriate: allergies, current medications, past family history, past medical history, past social history, past surgical history and problem list. Problem list updated.  Objective:   Vitals:   11/03/17 0930  BP: 97/62  Pulse: 91  Weight: 224 lb 3.2 oz (101.7 kg)    Fetal Status: Fetal Heart Rate (bpm): 150   Movement: Present     General:  Alert, oriented and cooperative. Patient is in no acute distress.  Skin: Skin is warm and dry. No rash noted.  Probable nevus on clitoral hood  Cardiovascular: Normal heart rate noted  Respiratory: Normal respiratory effort, no problems with respiration noted  Abdomen: Soft, gravid, appropriate for gestational age. Pain/Pressure: Present     Pelvic:  Cervical exam deferred        Extremities: Normal range of motion.  Edema: Trace  Mental Status: Normal mood and affect. Normal behavior. Normal judgment and thought content.   Urinalysis:      Assessment and Plan:  Pregnancy: G2P0010 at [redacted]w[redacted]d  1. Supervision of other normal pregnancy, antepartum.  Could not tolerate glucola for 2 hour GTT Rx: - HgB A1c  2. S/P myomectomy - needs delivery by C/S, per Dr. Kerin Perna  Preterm labor symptoms and general obstetric precautions including but not limited to vaginal bleeding,  contractions, leaking of fluid and fetal movement were reviewed in detail with the patient. Please refer to After Visit Summary for other counseling recommendations.  Return in about 2 weeks (around 11/17/2017) for ROB.   Shelly Bombard, MD

## 2017-11-03 NOTE — Progress Notes (Signed)
Patient reports good fetal movement and complains of pain in-between her legs, that she believes is related to sciatica. Pt denies contractions and bleeding. Pt reports mole on labia and wants it to be checked today.

## 2017-11-04 LAB — HEMOGLOBIN A1C
Est. average glucose Bld gHb Est-mCnc: 108 mg/dL
HEMOGLOBIN A1C: 5.4 % (ref 4.8–5.6)

## 2017-11-05 LAB — THYROID STIMULATING IMMUNOGLOBULIN: TSI: 126 %{baseline} (ref <140)

## 2017-11-17 ENCOUNTER — Other Ambulatory Visit: Payer: Self-pay

## 2017-11-17 ENCOUNTER — Ambulatory Visit (INDEPENDENT_AMBULATORY_CARE_PROVIDER_SITE_OTHER): Payer: Managed Care, Other (non HMO) | Admitting: Obstetrics

## 2017-11-17 ENCOUNTER — Encounter: Payer: Self-pay | Admitting: Obstetrics

## 2017-11-17 ENCOUNTER — Encounter: Payer: Self-pay | Admitting: *Deleted

## 2017-11-17 VITALS — BP 95/63 | HR 90 | Wt 227.0 lb

## 2017-11-17 DIAGNOSIS — Z3480 Encounter for supervision of other normal pregnancy, unspecified trimester: Secondary | ICD-10-CM

## 2017-11-17 DIAGNOSIS — Z23 Encounter for immunization: Secondary | ICD-10-CM | POA: Diagnosis not present

## 2017-11-17 DIAGNOSIS — Z3483 Encounter for supervision of other normal pregnancy, third trimester: Secondary | ICD-10-CM

## 2017-11-17 NOTE — Progress Notes (Signed)
ROB. FLU given in RD, tolerated well.

## 2017-11-17 NOTE — Progress Notes (Signed)
Subjective:  Krista Hogan is a 37 y.o. G2P0010 at [redacted]w[redacted]d being seen today for ongoing prenatal care.  She is currently monitored for the following issues for this high risk pregnancy and has Submucous leiomyoma of uterus; Chronic pelvic pain in female; Graves disease; Supervision of other normal pregnancy, antepartum; Antepartum multigravida of advanced maternal age; [redacted] weeks gestation of pregnancy; and Encounter for (NT) nuchal translucency scan on their problem list.  Patient reports no complaints.  Contractions: Not present. Vag. Bleeding: None.  Movement: Present. Denies leaking of fluid.   The following portions of the patient's history were reviewed and updated as appropriate: allergies, current medications, past family history, past medical history, past social history, past surgical history and problem list. Problem list updated.  Objective:   Vitals:   11/17/17 1119  BP: 95/63  Pulse: 90  Weight: 227 lb (103 kg)    Fetal Status:     Movement: Present     General:  Alert, oriented and cooperative. Patient is in no acute distress.  Skin: Skin is warm and dry. No rash noted.   Cardiovascular: Normal heart rate noted  Respiratory: Normal respiratory effort, no problems with respiration noted  Abdomen: Soft, gravid, appropriate for gestational age. Pain/Pressure: Absent     Pelvic:  Cervical exam deferred        Extremities: Normal range of motion.  Edema: Trace  Mental Status: Normal mood and affect. Normal behavior. Normal judgment and thought content.   Urinalysis:      Assessment and Plan:  Pregnancy: G2P0010 at [redacted]w[redacted]d  1. Supervision of other normal pregnancy, antepartum   Preterm labor symptoms and general obstetric precautions including but not limited to vaginal bleeding, contractions, leaking of fluid and fetal movement were reviewed in detail with the patient. Please refer to After Visit Summary for other counseling recommendations.  Return in about 2 weeks (around  12/01/2017) for ROB.   Shelly Bombard, MD

## 2017-11-30 ENCOUNTER — Other Ambulatory Visit (HOSPITAL_COMMUNITY): Payer: Self-pay | Admitting: *Deleted

## 2017-11-30 ENCOUNTER — Encounter (HOSPITAL_COMMUNITY): Payer: Self-pay

## 2017-11-30 ENCOUNTER — Ambulatory Visit (HOSPITAL_COMMUNITY)
Admission: RE | Admit: 2017-11-30 | Discharge: 2017-11-30 | Disposition: A | Discharge status: Home / Self Care | Payer: Managed Care, Other (non HMO) | Source: Ambulatory Visit | Attending: Obstetrics | Admitting: Obstetrics

## 2017-11-30 DIAGNOSIS — O99213 Obesity complicating pregnancy, third trimester: Secondary | ICD-10-CM | POA: Insufficient documentation

## 2017-11-30 DIAGNOSIS — O99283 Endocrine, nutritional and metabolic diseases complicating pregnancy, third trimester: Secondary | ICD-10-CM

## 2017-11-30 DIAGNOSIS — O09523 Supervision of elderly multigravida, third trimester: Secondary | ICD-10-CM

## 2017-11-30 DIAGNOSIS — Z87798 Personal history of other (corrected) congenital malformations: Secondary | ICD-10-CM | POA: Diagnosis not present

## 2017-11-30 DIAGNOSIS — O99212 Obesity complicating pregnancy, second trimester: Secondary | ICD-10-CM | POA: Diagnosis not present

## 2017-11-30 DIAGNOSIS — E669 Obesity, unspecified: Secondary | ICD-10-CM | POA: Diagnosis not present

## 2017-11-30 DIAGNOSIS — Z3A31 31 weeks gestation of pregnancy: Secondary | ICD-10-CM | POA: Diagnosis not present

## 2017-11-30 DIAGNOSIS — E0590 Thyrotoxicosis, unspecified without thyrotoxic crisis or storm: Secondary | ICD-10-CM | POA: Insufficient documentation

## 2017-11-30 DIAGNOSIS — O09522 Supervision of elderly multigravida, second trimester: Secondary | ICD-10-CM | POA: Diagnosis not present

## 2017-12-01 ENCOUNTER — Encounter (HOSPITAL_COMMUNITY): Payer: Self-pay

## 2017-12-01 ENCOUNTER — Other Ambulatory Visit: Payer: Managed Care, Other (non HMO)

## 2017-12-01 ENCOUNTER — Other Ambulatory Visit: Payer: Self-pay | Admitting: Obstetrics

## 2017-12-01 ENCOUNTER — Encounter: Payer: Self-pay | Admitting: Obstetrics

## 2017-12-01 ENCOUNTER — Ambulatory Visit (INDEPENDENT_AMBULATORY_CARE_PROVIDER_SITE_OTHER): Payer: Managed Care, Other (non HMO) | Admitting: Obstetrics

## 2017-12-01 VITALS — BP 94/62 | HR 84 | Wt 228.4 lb

## 2017-12-01 DIAGNOSIS — Z3480 Encounter for supervision of other normal pregnancy, unspecified trimester: Secondary | ICD-10-CM

## 2017-12-01 DIAGNOSIS — Z9889 Other specified postprocedural states: Secondary | ICD-10-CM

## 2017-12-01 NOTE — Progress Notes (Addendum)
Subjective:  Krista Hogan is a 37 y.o. G2P0010 at [redacted]w[redacted]d being seen today for ongoing prenatal care.  She is currently monitored for the following issues for this high-risk pregnancy and has Submucous leiomyoma of uterus; Chronic pelvic pain in female; Graves disease; Supervision of other normal pregnancy, antepartum; Antepartum multigravida of advanced maternal age; [redacted] weeks gestation of pregnancy; and Encounter for (NT) nuchal translucency scan on their problem list.  Patient reports no complaints.  Contractions: Not present. Vag. Bleeding: None.  Movement: Present. Denies leaking of fluid.   The following portions of the patient's history were reviewed and updated as appropriate: allergies, current medications, past family history, past medical history, past social history, past surgical history and problem list. Problem list updated.  Objective:   Vitals:   12/01/17 0945  BP: 94/62  Pulse: 84  Weight: 228 lb 6.4 oz (103.6 kg)    Fetal Status: Fetal Heart Rate (bpm): 150   Movement: Present     General:  Alert, oriented and cooperative. Patient is in no acute distress.  Skin: Skin is warm and dry. No rash noted.   Cardiovascular: Normal heart rate noted  Respiratory: Normal respiratory effort, no problems with respiration noted  Abdomen: Soft, gravid, appropriate for gestational age. Pain/Pressure: Absent     Pelvic:  Cervical exam deferred        Extremities: Normal range of motion.  Edema: Trace  Mental Status: Normal mood and affect. Normal behavior. Normal judgment and thought content.   Urinalysis:      Assessment and Plan:  Pregnancy: G2P0010 at [redacted]w[redacted]d  1. Supervision of other normal pregnancy, antepartum Rx: - Glucose Tolerance, 2 Hours w/1 Hour - CBC - RPR - HIV Antibody (routine testing w rflx)  2. S/P myomectomy - for Cesarean Section at ~ 37-38 weeks, per Dr. Charlett Lango recommendation   Governor Specking, MD  Physician  Gynecology  Op Note  Addendum   Date of Service:  11/22/2014 1:54 PM            Operative Note  Preoperative diagnosis: Menorrhagia, submucosal/intramural myoma, pelvic pain, vulvar skin tags   Postoperative diagnosis: Menorrhagia, submucosal/intramural myoma, pelvic pain, vulvar skin tags  Procedure: Laparoscopy, GelPort assisted myomectomy, chromopertubation, excision of vulvar skin tags  Anesthesia: Gen. endotracheal  Complications: None  Estimated blood loss: 50 mL  Specimens: Skin tags, uterine myoma, to pathology  Findings: On exam under anesthesia, there were 1-2 mm skin tags on the medial thighs and around the vulva which were removed. External genitalia otherwise look normal. The vagina appeared normal. The cervix was grossly normal. The uterus sounded to 8 cm. Uterine corpus was palpated at 7-8 gestational weeks size. There were no adnexal masses. There was no posterior cul-de-sac nodularity or induration.  On laparoscopy, upper abdomen, liver surface and diaphragm surfaces were normal. Gallbladder was not visualized. The appendix was not visualized. There was about 20 mL of dark unclotted blood in the pelvis. Patient had been having breakthrough bleeding on Femara and danazol. The pelvic peritoneum looked mostly normal.  The uterus was enlarged with the transmural myoma measuring 4.5 x 3.5 x 3 cm in the anterior wall. The posterior aspect of this myoma went although after the endometrium. No other myomas were prominent.  Both fallopian tubes were normal proximally and distally but the methylene blue did not go into the fallopian tubes. This may have been due to an artifact during the chromopertubation. Both ovaries appeared normal as well as the ovarian fossae. The right  ovary contained a 2 cm follicular cyst.  Description of the procedure: The patient was placed in dorsal supine position and general endotracheal anesthesia was given. 2 g of cefazolin were given intravenously for prophylaxis.  Patient was placed in lithotomy position. She was prepped and draped inside manner. A Foley catheter was inserted into the bladder. A ZUMI catheter was placed into the uterine cavity and connected to a syringe containing diluted methylene blue solution. The surgeon was regloved and a surgical field was created on the abdomen.  After preemptive anesthesia of all surgical sites with 0.25% bupivacaine with 1 200,000 epinephrine, a 5 mm intraumbilical skin incision was made and a Verress needle was inserted. Its correct location was confirmed. A pneumoperitoneum was created with carbon dioxide.  5 mm laparoscope with a 30 lens was inserted and video laparoscopy was started . A left lower quadrant 5 mm and a right lower quadrant  5 mm incisions were made and ancillary trochars were placed under direct visualization. Above findings were noted.  A dilute solution of vasopressin (0.4 units per mL) was injected into the myometrium until it blanched. (20 mL) using at 43 W cutting current on the needle tip electrode a horizontal anterior myometrial incision was made until the myoma was seen. Next using the tenaculum the myoma was removed from its surgical capsule by blunt and sharp dissection.  We made a 3 cm fence the incision at the pubic hairline. After dissection of the anatomic layers, the peritoneal cavity was entered. A GelPort was placed in the rest of the procedure continued either using this as a laparoscopic port or as a minilaparotomy port. The myoma was extracted through the incision. Using the same port the myoma defect was closed in 3 layers. The first layer was a continuous interlocking suture of 2-0 Vicryl. The second layer was a continuous nonlocking suture of 2-0 Vicryl. The serosa was approximated with 4-0 Vicryl on an SH needle. Good hemostasis was insured. An Interceed sheath was placed on the myoma incision. The fascia of the 3 cm incision was closed with 2-0 Vicryl continuous suture. Rest of  the pelvis was copiously irrigated and aspirated and hemostasis and lap pad counts were checked. No other lesions were found on the pelvic peritoneum. Chromotubation was tried again but did tubes did not fill. She will need postoperative HSG. The gas was allowed to escape. The incisions were approximated with 4-0 Monocryl subcutaneous  sutures.  Using an ophthalmologic electrocautery the vulvar skin tags were elevated on absence and excised with electrocautery. Hemostasis was achieved. Specimens were sent to pathology. The patient tolerated the procedure well and was transferred to recovery in satisfactory condition.  SPECIAL NOTE: Because of the extent of the myometrial incision, it is recommended that the patient deliver her pregnancies via cesarean section.  Governor Specking, MD     Preterm labor symptoms and general obstetric precautions including but not limited to vaginal bleeding, contractions, leaking of fluid and fetal movement were reviewed in detail with the patient. Please refer to After Visit Summary for other counseling recommendations.  Return in about 2 weeks (around 12/15/2017) for ROB.   Shelly Bombard, MD

## 2017-12-01 NOTE — Progress Notes (Signed)
ROB w/ no complaints. Vaccines up to date.

## 2017-12-02 ENCOUNTER — Other Ambulatory Visit: Payer: Self-pay | Admitting: Obstetrics

## 2017-12-02 DIAGNOSIS — O24419 Gestational diabetes mellitus in pregnancy, unspecified control: Secondary | ICD-10-CM

## 2017-12-02 LAB — CBC
HEMATOCRIT: 35 % (ref 34.0–46.6)
Hemoglobin: 11.7 g/dL (ref 11.1–15.9)
MCH: 29.6 pg (ref 26.6–33.0)
MCHC: 33.4 g/dL (ref 31.5–35.7)
MCV: 89 fL (ref 79–97)
Platelets: 279 10*3/uL (ref 150–450)
RBC: 3.95 x10E6/uL (ref 3.77–5.28)
RDW: 13.6 % (ref 12.3–15.4)
WBC: 8.7 10*3/uL (ref 3.4–10.8)

## 2017-12-02 LAB — GLUCOSE TOLERANCE, 2 HOURS W/ 1HR
GLUCOSE, 2 HOUR: 156 mg/dL — AB (ref 65–152)
Glucose, 1 hour: 176 mg/dL (ref 65–179)
Glucose, Fasting: 82 mg/dL (ref 65–91)

## 2017-12-02 LAB — HIV ANTIBODY (ROUTINE TESTING W REFLEX): HIV SCREEN 4TH GENERATION: NONREACTIVE

## 2017-12-02 LAB — RPR: RPR Ser Ql: NONREACTIVE

## 2017-12-02 MED ORDER — ACCU-CHEK FASTCLIX LANCETS MISC: 1.0000 | Freq: Four times a day (QID) | 12 refills | Status: DC

## 2017-12-02 MED ORDER — GLUCOSE BLOOD VI STRP: ORAL_STRIP | 12 refills | Status: DC

## 2017-12-02 MED ORDER — ACCU-CHEK AVIVA PLUS W/DEVICE KIT: 1.0000 | PACK | 0 refills | Status: DC

## 2017-12-02 NOTE — Progress Notes (Signed)
Pt aware of elevated 2 gtt. She has GDM, CBG testing explained in detail, supplies sent to [facility], referral to N&D, and pt has already signed up for GDM [employer].

## 2017-12-05 ENCOUNTER — Inpatient Hospital Stay (HOSPITAL_COMMUNITY)
Admission: AD | Admit: 2017-12-05 | Discharge: 2017-12-05 | Disposition: A | Discharge status: Home / Self Care | Payer: Managed Care, Other (non HMO) | Source: Ambulatory Visit | Attending: Family Medicine | Admitting: Family Medicine

## 2017-12-05 ENCOUNTER — Encounter (HOSPITAL_COMMUNITY): Payer: Self-pay | Admitting: *Deleted

## 2017-12-05 DIAGNOSIS — O2343 Unspecified infection of urinary tract in pregnancy, third trimester: Secondary | ICD-10-CM | POA: Insufficient documentation

## 2017-12-05 DIAGNOSIS — Z87891 Personal history of nicotine dependence: Secondary | ICD-10-CM | POA: Diagnosis not present

## 2017-12-05 DIAGNOSIS — Z3A32 32 weeks gestation of pregnancy: Secondary | ICD-10-CM | POA: Insufficient documentation

## 2017-12-05 DIAGNOSIS — R109 Unspecified abdominal pain: Secondary | ICD-10-CM | POA: Diagnosis present

## 2017-12-05 LAB — WET PREP, GENITAL
Clue Cells Wet Prep HPF POC: NONE SEEN
Sperm: NONE SEEN
Trich, Wet Prep: NONE SEEN
Yeast Wet Prep HPF POC: NONE SEEN

## 2017-12-05 LAB — URINALYSIS, ROUTINE W REFLEX MICROSCOPIC
Bilirubin Urine: NEGATIVE
Glucose, UA: NEGATIVE mg/dL
Ketones, ur: 20 mg/dL — AB
Leukocytes, UA: NEGATIVE
Nitrite: NEGATIVE
Protein, ur: 100 mg/dL — AB
RBC / HPF: >50 RBC/hpf — ABNORMAL HIGH (ref 0–5)
Specific Gravity, Urine: 1.028 (ref 1.005–1.030)
WBC, UA: >50 WBC/hpf — ABNORMAL HIGH (ref 0–5)
pH: 6 (ref 5.0–8.0)

## 2017-12-05 MED ORDER — CEPHALEXIN 500 MG PO CAPS: 500.0000 mg | ORAL_CAPSULE | Freq: Four times a day (QID) | ORAL | 0 refills | Status: AC

## 2017-12-05 NOTE — MAU Note (Signed)
Krista Hogan is a 37 y.o. at [redacted]w[redacted]d here in MAU reporting: +lower abdominal cramping that is constant. +vaginal bleeding. Not having to wear a pad. Notices when wipes. Pink in color.  Onset of complaint: started today when woke up Pain score: 5/10. Took tylenol 20 minutes ago. Vitals:   12/05/17 1059 12/05/17 1242  BP: 106/72 128/81  Pulse: 79 80  Resp: 19   Temp: 97.6 F (36.4 C)   SpO2: 99%      Lab orders placed from triage: ua

## 2017-12-05 NOTE — Discharge Instructions (Signed)

## 2017-12-05 NOTE — MAU Provider Note (Signed)
Chief Complaint:  Vaginal Bleeding and Abdominal Pain   First Provider Initiated Contact with Patient 12/05/17 1119     HPI: Krista Hogan is a 37 y.o. G2P0010 at 15w3dwho presents to maternity admissions reporting abdominal pain and vaginal bleeding. Lower abdominal cramping started this morning. This evening she noticed pink staining on toilet paper after voiding. Not bleeding into underwear. Pt unsure if blood was vaginal or urinary. Also reports urinary frequency. Denies n/v/d, constipation, LOF, or recent intercourse. Positive fetal movement.  Denies contractions, leakage of fluid or vaginal bleeding. Good fetal movement.  Location: lower abdomen Quality: cramping Severity: 5/10 in pain scale Duration: 1 day Timing: intermittent Modifying factors: none Associated signs and symptoms: VB vs hematuria, urinary frequency    Past Medical History:  Diagnosis Date  . DUB (dysfunctional uterine bleeding)   . Graves disease 06/2016  . Graves disease   . History of abnormal cervical Pap smear    2011  . History of HPV infection   . Uterine fibroid   . Wears glasses    OB History  Gravida Para Term Preterm AB Living  2 0 0 0 1 0  SAB TAB Ectopic Multiple Live Births  1 0 0 0      # Outcome Date GA Lbr Len/2nd Weight Sex Delivery Anes PTL Lv  2 Current           1 SAB 04/22/17     SAB      Past Surgical History:  Procedure Laterality Date  . EXCISION OF SKIN TAG  11/22/2014   Procedure: EXCISION OF SKIN TAG VULVA;  Surgeon: TGovernor Specking MD;  [facility]  Service: Gynecology;;  . LAPAROSCOPIC GELPORT ASSISTED MYOMECTOMY N/A 11/22/2014   Procedure: LAPAROSCOPIC GELPORT ASSISTED MYOMECTOMY WITH CHROMOTUBATION;  Surgeon: TGovernor Specking MD;  [facility]  Service: Gynecology;  Laterality: N/A;  . LAPAROSCOPY N/A 11/22/2014   Procedure: LAPAROSCOPY DIAGNOSTIC;  Surgeon: TGovernor Specking MD;  [facility]  Service: Gynecology;  Laterality: N/A;  . LEEP  2011  . TONSILLECTOMY AND ADENOIDECTOMY  age 89 . WISDOM TOOTH EXTRACTION  2006   Family History  Problem Relation Age of Onset  . Fibroids Mother   . Thyroid disease Neg Hx    Social History   Tobacco Use  . Smoking status: Former Smoker    Packs/day: 0.25    Years: 10.00    Pack years: 2.50    Types: Cigarettes    Last attempt to quit: 04/2017    Years since quitting: 0.6  . Smokeless tobacco: Never Used  Substance Use Topics  . Alcohol use: Yes    Alcohol/week: 0.0 standard drinks    Comment: SOCIAL  . Drug use: No   No Known Allergies Medications Prior to Admission  Medication Sig Dispense Refill Last Dose  . ACCU-CHEK FASTCLIX LANCETS MISC 1 Stick by Percutaneous route 4 (four) times daily. 100 each 12   . Blood Glucose Monitoring Suppl (ACCU-CHEK AVIVA PLUS) w/Device KIT 1 Device by Percutaneous route 4 (four) times a week. 1 kit 0   . cetirizine (ZYRTEC) 10 MG tablet Take 10 mg by mouth daily.   Taking  . cholecalciferol (VITAMIN D) 1000 units tablet Take 1,000 Units by mouth daily.   Taking  . cyanocobalamin 1000 MCG tablet Take 1,000 mcg by mouth daily.   Taking  . cyclobenzaprine (FLEXERIL) 10 MG tablet Take 1 tablet (10 mg total) by  mouth 3 (three) times daily as needed for muscle spasms. (Patient not taking: Reported on 11/03/2017) 30 tablet 1 Not Taking  . Elastic Bandages & Supports (COMFORT FIT MATERNITY SUPP SM) MISC Wear as directed. 1 each 0 Taking  . glucose blood test strip Check blood sugar level by percutaneous route 4 times daily 100 each 12   . Omega-3 Fatty Acids (FISH OIL) 1000 MG CAPS Take 1 capsule by mouth daily.   Taking  . ondansetron (ZOFRAN ODT) 8 MG disintegrating tablet Take 1 tablet (8 mg total) by mouth every 8 (eight) hours as needed for nausea or vomiting. 20 tablet 0 Taking  . Prenat w/o A-FeCb-FeGl-DSS-FA (CITRANATAL RX) 27-1 MG TABS Take 1 tablet by mouth daily before breakfast.  90 each 4 Taking  . Prenatal Vit-Fe Fumarate-FA (PRENATAL PLUS/IRON) 27-1 MG TABS Take 1 tablet by mouth daily before breakfast. 90 each 4 Taking  . propylthiouracil (PTU) 50 MG tablet Take 1 tablet (50 mg total) by mouth 3 (three) times daily. (Patient not taking: Reported on 11/03/2017) 30 tablet 1 Not Taking    I have reviewed patient's Past Medical Hx, Surgical Hx, Family Hx, Social Hx, medications and allergies.   ROS:  Review of Systems  Constitutional: Negative.   Gastrointestinal: Positive for abdominal pain. Negative for constipation, diarrhea, nausea and vomiting.  Genitourinary: Positive for frequency and hematuria. Negative for dysuria, flank pain and vaginal discharge.    Physical Exam   Patient Vitals for the past 24 hrs:  BP Temp Temp src Pulse Resp SpO2 Weight  12/05/17 1059 106/72 97.6 F (36.4 C) Oral 79 19 99 % -  12/05/17 1055 - - - - - - 103.6 kg    Constitutional: Well-developed, well-nourished female in no acute distress.  Cardiovascular: normal rate & rhythm, no murmur Respiratory: normal effort, lung sounds clear throughout GI: Abd soft, non-tender, gravid appropriate for gestational age. Pos BS x 4 MS: Extremities nontender, no edema, normal ROM Neurologic: Alert and oriented x 4.  GU:      Pelvic: NEFG, physiologic discharge. No pooling of fluid or vaginal bleeding Cervix closed/thick/high      NST:  Baseline: 130 bpm, Variability: Good {> 6 bpm), Accelerations: Reactive and Decelerations: Absent   Labs: Results for orders placed or performed during the hospital encounter of 12/05/17 (from the past 48 hour(s))  Urinalysis, Routine w reflex microscopic     Status: Abnormal   Collection Time: 12/05/17 11:34 AM  Result Value Ref Range   Color, Urine AMBER (A) YELLOW    Comment: BIOCHEMICALS MAY BE AFFECTED BY COLOR   APPearance CLOUDY (A) CLEAR   Specific Gravity, Urine 1.028 1.005 - 1.030   pH 6.0 5.0 - 8.0   Glucose, UA NEGATIVE NEGATIVE mg/dL    Hgb urine dipstick LARGE (A) NEGATIVE   Bilirubin Urine NEGATIVE NEGATIVE   Ketones, ur 20 (A) NEGATIVE mg/dL   Protein, ur 100 (A) NEGATIVE mg/dL   Nitrite NEGATIVE NEGATIVE   Leukocytes, UA NEGATIVE NEGATIVE   RBC / HPF >50 (H) 0 - 5 RBC/hpf   WBC, UA >50 (H) 0 - 5 WBC/hpf   Bacteria, UA RARE (A) NONE SEEN   Squamous Epithelial / LPF 6-10 0 - 5   Mucus PRESENT     Comment: Performed at [facility], [address], [place], [place] [address]  Wet prep, genital     Status: Abnormal   Collection Time: 12/05/17 11:35 AM  Result Value Ref Range   Yeast Wet  Prep HPF POC NONE SEEN NONE SEEN   Trich, Wet Prep NONE SEEN NONE SEEN   Clue Cells Wet Prep HPF POC NONE SEEN NONE SEEN   WBC, Wet Prep HPF POC FEW (A) NONE SEEN    Comment: BACTERIA- TOO NUMEROUS TO COUNT   Sperm NONE SEEN     Comment: Swab received with less than 0.5 mL of saline, saline added to specimen, interpret results with caution. Performed at [facility], [address], [place], [place] [address]      Imaging:  No results found.  MAU Course: Orders Placed This Encounter  Procedures  . Urinalysis, Routine w reflex microscopic   Meds ordered this encounter  Medications  . cephALEXin (KEFLEX) 500 MG capsule    Sig: Take 1 capsule (500 mg total) by mouth 4 (four) times daily for 10 days.    Dispense:  40 capsule    Refill:  0    Order Specific Question:   Supervising Provider    Answer:   Merrily Pew    MDM: Reactive NST. No contractions. Abdomen soft & non tender. Cervix closed/thick No blood on exam. Based on visual appearance of urine, suspect hematuria instead of vaginal bleeding.  VSS. NAD.  U/a suspicious for UTI, will send for culture & tx with keflex  Assessment: 1. Urinary tract infection in mother during third trimester of pregnancy   2. [redacted] weeks gestation of pregnancy     Plan: Discharge home in stable condition.  Preterm Labor precautions and fetal kick counts Urine  culture pending   Allergies as of 12/05/2017   No Known Allergies     Medication List    STOP taking these medications   cyclobenzaprine 10 MG tablet Commonly known as:  FLEXERIL   PRENATAL PLUS/IRON 27-1 MG Tabs   propylthiouracil 50 MG tablet Commonly known as:  PTU     TAKE these medications   ACCU-CHEK AVIVA PLUS w/Device Kit 1 Device by Percutaneous route 4 (four) times a week.   ACCU-CHEK FASTCLIX LANCETS Misc 1 Stick by Percutaneous route 4 (four) times daily.   cephALEXin 500 MG capsule Commonly known as:  KEFLEX Take 1 capsule (500 mg total) by mouth 4 (four) times daily for 10 days.   cetirizine 10 MG tablet Commonly known as:  ZYRTEC Take 10 mg by mouth daily.   cholecalciferol 1000 units tablet Commonly known as:  VITAMIN D Take 1,000 Units by mouth daily.   CITRANATAL RX 27-1 MG Tabs Take 1 tablet by mouth daily before breakfast.   COMFORT FIT MATERNITY SUPP SM Misc Wear as directed.   cyanocobalamin 1000 MCG tablet Take 1,000 mcg by mouth daily.   Fish Oil 1000 MG Caps Take 1 capsule by mouth daily.   glucose blood test strip Check blood sugar level by percutaneous route 4 times daily   ondansetron 8 MG disintegrating tablet Commonly known as:  ZOFRAN-ODT Take 1 tablet (8 mg total) by mouth every 8 (eight) hours as needed for nausea or vomiting.       Jorje Guild, NP 12/05/2017 11:19 AM

## 2017-12-07 LAB — CULTURE, OB URINE

## 2017-12-15 ENCOUNTER — Encounter: Payer: Self-pay | Admitting: Obstetrics

## 2017-12-15 ENCOUNTER — Other Ambulatory Visit: Payer: Self-pay

## 2017-12-15 ENCOUNTER — Ambulatory Visit (INDEPENDENT_AMBULATORY_CARE_PROVIDER_SITE_OTHER): Payer: Managed Care, Other (non HMO) | Admitting: Obstetrics

## 2017-12-15 VITALS — BP 91/65 | HR 93 | Wt 228.8 lb

## 2017-12-15 DIAGNOSIS — O24419 Gestational diabetes mellitus in pregnancy, unspecified control: Secondary | ICD-10-CM

## 2017-12-15 DIAGNOSIS — O0990 Supervision of high risk pregnancy, unspecified, unspecified trimester: Secondary | ICD-10-CM

## 2017-12-15 DIAGNOSIS — Z9889 Other specified postprocedural states: Secondary | ICD-10-CM

## 2017-12-15 DIAGNOSIS — E0500 Thyrotoxicosis with diffuse goiter without thyrotoxic crisis or storm: Secondary | ICD-10-CM

## 2017-12-15 DIAGNOSIS — O0993 Supervision of high risk pregnancy, unspecified, third trimester: Secondary | ICD-10-CM

## 2017-12-15 MED ORDER — GLUCOSE BLOOD VI STRP: ORAL_STRIP | 12 refills | Status: DC

## 2017-12-15 NOTE — Progress Notes (Signed)
Patient reports good fetal movement with occasional pain/pressure. Pt states that she had death in the family and has not had a chance to pick up diabetic supplies from pharmacy.

## 2017-12-15 NOTE — Progress Notes (Signed)
Pt received Accucheck meter but one touch test strips. Ordered accucheck strips to go with her machine.

## 2017-12-15 NOTE — Progress Notes (Signed)
Subjective:  DELANE STALLING is a 37 y.o. G2P0010 at [redacted]w[redacted]d being seen today for ongoing prenatal care.  She is currently monitored for the following issues for this high-risk pregnancy and has Submucous leiomyoma of uterus; Chronic pelvic pain in female; Graves disease; Supervision of other normal pregnancy, antepartum; Antepartum multigravida of advanced maternal age; [redacted] weeks gestation of pregnancy; and Encounter for (NT) nuchal translucency scan on their problem list.  Patient reports backache and pelvic pressure and pain.  Contractions: Irritability. Vag. Bleeding: None.  Movement: Present. Denies leaking of fluid.   The following portions of the patient's history were reviewed and updated as appropriate: allergies, current medications, past family history, past medical history, past social history, past surgical history and problem list. Problem list updated.  Objective:   Vitals:   12/15/17 1011  BP: 91/65  Pulse: 93  Weight: 228 lb 12.8 oz (103.8 kg)    Fetal Status: Fetal Heart Rate (bpm): 150   Movement: Present     General:  Alert, oriented and cooperative. Patient is in no acute distress.  Skin: Skin is warm and dry. No rash noted.   Cardiovascular: Normal heart rate noted  Respiratory: Normal respiratory effort, no problems with respiration noted  Abdomen: Soft, gravid, appropriate for gestational age. Pain/Pressure: Present     Pelvic:  Cervical exam deferred        Extremities: Normal range of motion.  Edema: Trace  Mental Status: Normal mood and affect. Normal behavior. Normal judgment and thought content.   Urinalysis:      Assessment and Plan:  Pregnancy: G2P0010 at [redacted]w[redacted]d  1. Supervision of high risk pregnancy, antepartum  2. Gestational diabetes mellitus (GDM), antepartum, gestational diabetes method of control unspecified  3. S/P myomectomy - Cesarean Section at 36-37 weeks  4. Graves' disease - stable  Preterm labor symptoms and general obstetric  precautions including but not limited to vaginal bleeding, contractions, leaking of fluid and fetal movement were reviewed in detail with the patient. Please refer to After Visit Summary for other counseling recommendations.  Return in about 2 weeks (around 12/29/2017) for ROB.   Shelly Bombard, MD

## 2017-12-22 ENCOUNTER — Encounter (HOSPITAL_COMMUNITY): Payer: Self-pay

## 2017-12-28 ENCOUNTER — Ambulatory Visit (HOSPITAL_COMMUNITY)
Admission: RE | Admit: 2017-12-28 | Discharge: 2017-12-28 | Disposition: A | Discharge status: Home / Self Care | Payer: Managed Care, Other (non HMO) | Source: Ambulatory Visit | Attending: Obstetrics | Admitting: Obstetrics

## 2017-12-28 ENCOUNTER — Telehealth: Payer: Self-pay

## 2017-12-28 ENCOUNTER — Other Ambulatory Visit (HOSPITAL_COMMUNITY): Payer: Self-pay | Admitting: Maternal and Fetal Medicine

## 2017-12-28 ENCOUNTER — Encounter (HOSPITAL_COMMUNITY): Payer: Self-pay

## 2017-12-28 DIAGNOSIS — O3429 Maternal care due to uterine scar from other previous surgery: Secondary | ICD-10-CM | POA: Diagnosis not present

## 2017-12-28 DIAGNOSIS — O24419 Gestational diabetes mellitus in pregnancy, unspecified control: Secondary | ICD-10-CM | POA: Insufficient documentation

## 2017-12-28 DIAGNOSIS — O99213 Obesity complicating pregnancy, third trimester: Secondary | ICD-10-CM | POA: Diagnosis not present

## 2017-12-28 DIAGNOSIS — O99283 Endocrine, nutritional and metabolic diseases complicating pregnancy, third trimester: Secondary | ICD-10-CM | POA: Diagnosis not present

## 2017-12-28 DIAGNOSIS — O24119 Pre-existing diabetes mellitus, type 2, in pregnancy, unspecified trimester: Secondary | ICD-10-CM

## 2017-12-28 DIAGNOSIS — E0500 Thyrotoxicosis with diffuse goiter without thyrotoxic crisis or storm: Secondary | ICD-10-CM | POA: Diagnosis not present

## 2017-12-28 DIAGNOSIS — Z3A35 35 weeks gestation of pregnancy: Secondary | ICD-10-CM | POA: Insufficient documentation

## 2017-12-28 DIAGNOSIS — O09523 Supervision of elderly multigravida, third trimester: Secondary | ICD-10-CM | POA: Diagnosis present

## 2017-12-28 NOTE — Telephone Encounter (Signed)
Returned call and pt stated that her leg is hurting because she thinks that she overexerted herself yesterday working too hard. Provider agreed to excuse pt from work today, pt has appt in office tomorrow.

## 2017-12-29 ENCOUNTER — Encounter: Payer: Self-pay | Admitting: Obstetrics

## 2017-12-29 ENCOUNTER — Ambulatory Visit (INDEPENDENT_AMBULATORY_CARE_PROVIDER_SITE_OTHER): Payer: Managed Care, Other (non HMO) | Admitting: Obstetrics

## 2017-12-29 VITALS — BP 107/77 | HR 82 | Wt 239.4 lb

## 2017-12-29 DIAGNOSIS — O0993 Supervision of high risk pregnancy, unspecified, third trimester: Secondary | ICD-10-CM

## 2017-12-29 DIAGNOSIS — O24419 Gestational diabetes mellitus in pregnancy, unspecified control: Secondary | ICD-10-CM

## 2017-12-29 DIAGNOSIS — O0990 Supervision of high risk pregnancy, unspecified, unspecified trimester: Secondary | ICD-10-CM

## 2017-12-29 DIAGNOSIS — E0500 Thyrotoxicosis with diffuse goiter without thyrotoxic crisis or storm: Secondary | ICD-10-CM

## 2017-12-29 DIAGNOSIS — Z9889 Other specified postprocedural states: Secondary | ICD-10-CM

## 2017-12-29 NOTE — Progress Notes (Signed)
Subjective:  Krista Hogan is a 37 y.o. G2P0010 at [redacted]w[redacted]d being seen today for ongoing prenatal care.  She is currently monitored for the following issues for this high-risk pregnancy and has Submucous leiomyoma of uterus; Chronic pelvic pain in female; Graves disease; Supervision of other normal pregnancy, antepartum; Antepartum multigravida of advanced maternal age; [redacted] weeks gestation of pregnancy; and Encounter for (NT) nuchal translucency scan on their problem list.  Patient reports no complaints.  Contractions: Not present. Vag. Bleeding: None.  Movement: Present. Denies leaking of fluid.   The following portions of the patient's history were reviewed and updated as appropriate: allergies, current medications, past family history, past medical history, past social history, past surgical history and problem list. Problem list updated.  Objective:   Vitals:   12/29/17 1033  BP: 107/77  Pulse: 82  Weight: 239 lb 6.4 oz (108.6 kg)    Fetal Status:     Movement: Present     General:  Alert, oriented and cooperative. Patient is in no acute distress.  Skin: Skin is warm and dry. No rash noted.   Cardiovascular: Normal heart rate noted  Respiratory: Normal respiratory effort, no problems with respiration noted  Abdomen: Soft, gravid, appropriate for gestational age. Pain/Pressure: Present     Pelvic:  Cervical exam deferred        Extremities: Normal range of motion.  Edema: Mild pitting, slight indentation  Mental Status: Normal mood and affect. Normal behavior. Normal judgment and thought content.   Urinalysis:      Assessment and Plan:  Pregnancy: G2P0010 at [redacted]w[redacted]d  1. Supervision of high risk pregnancy, antepartum  2. S/P myomectomy - C/S scheduled for 37 weeks  3. Gestational diabetes mellitus (GDM), antepartum, gestational diabetes method of control unspecified - stable  4. Graves' disease - stable  Preterm labor symptoms and general obstetric precautions including but  not limited to vaginal bleeding, contractions, leaking of fluid and fetal movement were reviewed in detail with the patient. Please refer to After Visit Summary for other counseling recommendations.  Return in about 1 week (around 01/05/2018) for ROB.   Shelly Bombard, MD

## 2018-01-04 ENCOUNTER — Ambulatory Visit (HOSPITAL_BASED_OUTPATIENT_CLINIC_OR_DEPARTMENT_OTHER)
Admission: RE | Admit: 2018-01-04 | Discharge: 2018-01-04 | Disposition: A | Discharge status: Home / Self Care | Payer: Managed Care, Other (non HMO) | Source: Ambulatory Visit | Attending: Internal Medicine | Admitting: Internal Medicine

## 2018-01-04 ENCOUNTER — Encounter (HOSPITAL_COMMUNITY): Payer: Self-pay

## 2018-01-04 DIAGNOSIS — Z3A36 36 weeks gestation of pregnancy: Secondary | ICD-10-CM

## 2018-01-04 DIAGNOSIS — O24419 Gestational diabetes mellitus in pregnancy, unspecified control: Secondary | ICD-10-CM | POA: Diagnosis not present

## 2018-01-04 DIAGNOSIS — O3429 Maternal care due to uterine scar from other previous surgery: Secondary | ICD-10-CM | POA: Diagnosis not present

## 2018-01-04 DIAGNOSIS — O99213 Obesity complicating pregnancy, third trimester: Secondary | ICD-10-CM | POA: Diagnosis not present

## 2018-01-04 DIAGNOSIS — O09523 Supervision of elderly multigravida, third trimester: Secondary | ICD-10-CM | POA: Insufficient documentation

## 2018-01-04 NOTE — Patient Instructions (Signed)
Zafiro A Queener  01/04/2018   Your procedure is scheduled on:  01/06/2018  Enter through the Main Entrance of [facility] at [place] up the phone at the desk and dial [phone]  Call this number if you have problems the morning of surgery:[phone]  Remember:   Do not eat food:(After Midnight) Desps de medianoche.  Do not drink clear liquids: (After Midnight) Desps de medianoche.  Take these medicines the morning of surgery with A SIP OF WATER: none   Do not wear jewelry, make-up or nail polish.  Do not wear lotions, powders, or perfumes. Do not wear deodorant.  Do not shave 48 hours prior to surgery.  Do not bring valuables to the hospital.  [facility] is not   responsible for any belongings or valuables brought to the hospital.  Contacts, dentures or bridgework may not be worn into surgery.  Leave suitcase in the car. After surgery it may be brought to your room.  For patients admitted to the hospital, checkout time is 11:00 AM the day of              discharge.    N/A   Please read over the following fact sheets that you were given:   Surgical Site Infection Prevention

## 2018-01-05 ENCOUNTER — Encounter (HOSPITAL_COMMUNITY)
Admission: RE | Admit: 2018-01-05 | Discharge: 2018-01-05 | Disposition: A | Discharge status: Home / Self Care | Payer: Managed Care, Other (non HMO) | Source: Ambulatory Visit | Attending: Family Medicine | Admitting: Family Medicine

## 2018-01-05 ENCOUNTER — Ambulatory Visit (INDEPENDENT_AMBULATORY_CARE_PROVIDER_SITE_OTHER): Payer: Managed Care, Other (non HMO) | Admitting: Obstetrics

## 2018-01-05 ENCOUNTER — Encounter: Payer: Self-pay | Admitting: Obstetrics

## 2018-01-05 ENCOUNTER — Encounter (HOSPITAL_COMMUNITY): Payer: Self-pay

## 2018-01-05 VITALS — BP 117/82 | HR 87 | Wt 239.0 lb

## 2018-01-05 DIAGNOSIS — Z113 Encounter for screening for infections with a predominantly sexual mode of transmission: Secondary | ICD-10-CM | POA: Diagnosis not present

## 2018-01-05 DIAGNOSIS — Z9889 Other specified postprocedural states: Secondary | ICD-10-CM

## 2018-01-05 DIAGNOSIS — O24419 Gestational diabetes mellitus in pregnancy, unspecified control: Secondary | ICD-10-CM

## 2018-01-05 DIAGNOSIS — E0500 Thyrotoxicosis with diffuse goiter without thyrotoxic crisis or storm: Secondary | ICD-10-CM

## 2018-01-05 DIAGNOSIS — O0990 Supervision of high risk pregnancy, unspecified, unspecified trimester: Secondary | ICD-10-CM

## 2018-01-05 DIAGNOSIS — O0993 Supervision of high risk pregnancy, unspecified, third trimester: Secondary | ICD-10-CM

## 2018-01-05 LAB — CBC
HEMATOCRIT: 35.3 % — AB (ref 36.0–46.0)
HEMOGLOBIN: 11.9 g/dL — AB (ref 12.0–15.0)
MCH: 29.6 pg (ref 26.0–34.0)
MCHC: 33.7 g/dL (ref 30.0–36.0)
MCV: 87.8 fL (ref 80.0–100.0)
Platelets: 229 10*3/uL (ref 150–400)
RBC: 4.02 MIL/uL (ref 3.87–5.11)
RDW: 15.1 % (ref 11.5–15.5)
WBC: 7.5 10*3/uL (ref 4.0–10.5)
nRBC: 0 % (ref 0.0–0.2)

## 2018-01-05 LAB — TYPE AND SCREEN
ABO/RH(D): O POS
Antibody Screen: NEGATIVE

## 2018-01-05 LAB — ABO/RH: ABO/RH(D): O POS

## 2018-01-05 NOTE — Progress Notes (Signed)
Subjective:  Krista Hogan is a 37 y.o. G2P0010 at [redacted]w[redacted]d being seen today for ongoing prenatal care.  She is currently monitored for the following issues for this high-risk pregnancy and has Submucous leiomyoma of uterus; Chronic pelvic pain in female; Graves disease; Supervision of other normal pregnancy, antepartum; Antepartum multigravida of advanced maternal age; [redacted] weeks gestation of pregnancy; and Encounter for (NT) nuchal translucency scan on their problem list.  Patient reports backache.  Contractions: Irregular. Vag. Bleeding: None.  Movement: Present. Denies leaking of fluid.   The following portions of the patient's history were reviewed and updated as appropriate: allergies, current medications, past family history, past medical history, past social history, past surgical history and problem list. Problem list updated.  Objective:   Vitals:   01/05/18 1149  BP: 117/82  Pulse: 87  Weight: 239 lb (108.4 kg)    Fetal Status: Fetal Heart Rate (bpm): 150   Movement: Present     General:  Alert, oriented and cooperative. Patient is in no acute distress.  Skin: Skin is warm and dry. No rash noted.   Cardiovascular: Normal heart rate noted  Respiratory: Normal respiratory effort, no problems with respiration noted  Abdomen: Soft, gravid, appropriate for gestational age. Pain/Pressure: Present     Pelvic:  Cervical exam deferred        Extremities: Normal range of motion.  Edema: Trace  Mental Status: Normal mood and affect. Normal behavior. Normal judgment and thought content.   Urinalysis:      Assessment and Plan:  Pregnancy: G2P0010 at [redacted]w[redacted]d  1. Supervision of high risk pregnancy, antepartum Rx: - Strep Gp B NAA - GC/Chlamydia probe amp ([place])not at [facility]  2. S/P myomectomy - C/S scheduled for 37 weeks on 01-05-2018, per MFM recommendation    3. Gestational diabetes mellitus (GDM), antepartum, gestational diabetes method of control unspecified - stable glucose  control  4. Graves' disease - stable  Term labor symptoms and general obstetric precautions including but not limited to vaginal bleeding, contractions, leaking of fluid and fetal movement were reviewed in detail with the patient. Please refer to After Visit Summary for other counseling recommendations.  Return in about 2 weeks (around 01/19/2018) for Postpartum.   Shelly Bombard, MD

## 2018-01-06 ENCOUNTER — Encounter (HOSPITAL_COMMUNITY): Payer: Self-pay | Admitting: *Deleted

## 2018-01-06 ENCOUNTER — Inpatient Hospital Stay (HOSPITAL_COMMUNITY)
Admission: RE | Admit: 2018-01-06 | Discharge: 2018-01-09 | DRG: 788 | Disposition: A | Discharge status: Home / Self Care | Payer: Managed Care, Other (non HMO) | Attending: Family Medicine | Admitting: Family Medicine

## 2018-01-06 ENCOUNTER — Inpatient Hospital Stay (HOSPITAL_COMMUNITY): Payer: Managed Care, Other (non HMO) | Admitting: Anesthesiology

## 2018-01-06 ENCOUNTER — Encounter (HOSPITAL_COMMUNITY)
Admission: RE | Disposition: A | Discharge status: Home / Self Care | Payer: Self-pay | Source: Home / Self Care | Attending: Family Medicine

## 2018-01-06 DIAGNOSIS — O99214 Obesity complicating childbirth: Secondary | ICD-10-CM | POA: Diagnosis present

## 2018-01-06 DIAGNOSIS — O99284 Endocrine, nutritional and metabolic diseases complicating childbirth: Secondary | ICD-10-CM | POA: Diagnosis present

## 2018-01-06 DIAGNOSIS — E0500 Thyrotoxicosis with diffuse goiter without thyrotoxic crisis or storm: Secondary | ICD-10-CM | POA: Diagnosis present

## 2018-01-06 DIAGNOSIS — Z3480 Encounter for supervision of other normal pregnancy, unspecified trimester: Secondary | ICD-10-CM

## 2018-01-06 DIAGNOSIS — Z3A37 37 weeks gestation of pregnancy: Secondary | ICD-10-CM

## 2018-01-06 DIAGNOSIS — Z98891 History of uterine scar from previous surgery: Secondary | ICD-10-CM

## 2018-01-06 DIAGNOSIS — Z87891 Personal history of nicotine dependence: Secondary | ICD-10-CM

## 2018-01-06 DIAGNOSIS — O3429 Maternal care due to uterine scar from other previous surgery: Principal | ICD-10-CM | POA: Diagnosis present

## 2018-01-06 DIAGNOSIS — O3483 Maternal care for other abnormalities of pelvic organs, third trimester: Secondary | ICD-10-CM

## 2018-01-06 DIAGNOSIS — O24420 Gestational diabetes mellitus in childbirth, diet controlled: Secondary | ICD-10-CM | POA: Diagnosis present

## 2018-01-06 DIAGNOSIS — D250 Submucous leiomyoma of uterus: Secondary | ICD-10-CM | POA: Diagnosis present

## 2018-01-06 HISTORY — DX: History of uterine scar from previous surgery: Z98.891

## 2018-01-06 LAB — GC/CHLAMYDIA PROBE AMP (~~LOC~~) NOT AT ARMC
CHLAMYDIA, DNA PROBE: NEGATIVE
Neisseria Gonorrhea: NEGATIVE

## 2018-01-06 LAB — GLUCOSE, CAPILLARY
GLUCOSE-CAPILLARY: 76 mg/dL (ref 70–99)
Glucose-Capillary: 78 mg/dL (ref 70–99)

## 2018-01-06 LAB — RPR: RPR: NONREACTIVE

## 2018-01-06 SURGERY — Surgical Case
Anesthesia: Spinal

## 2018-01-06 MED ORDER — ONDANSETRON HCL 4 MG/2ML IJ SOLN
INTRAMUSCULAR | Status: DC | PRN
  Administered 2018-01-06: 4 mg via INTRAVENOUS

## 2018-01-06 MED ORDER — LACTATED RINGERS IV SOLN
INTRAVENOUS | Status: DC
  Administered 2018-01-06: 23:00:00 via INTRAVENOUS

## 2018-01-06 MED ORDER — HYDROMORPHONE HCL 1 MG/ML IJ SOLN: 0.2500 mg | INTRAMUSCULAR | Status: DC | PRN

## 2018-01-06 MED ORDER — KETOROLAC TROMETHAMINE 30 MG/ML IJ SOLN
30.0000 mg | Freq: Once | INTRAMUSCULAR | Status: AC | PRN
  Administered 2018-01-06: 30 mg via INTRAVENOUS

## 2018-01-06 MED ORDER — SCOPOLAMINE 1 MG/3DAYS TD PT72: 1.0000 | MEDICATED_PATCH | Freq: Once | TRANSDERMAL | Status: DC

## 2018-01-06 MED ORDER — COCONUT OIL OIL: 1.0000 "application " | TOPICAL_OIL | Status: DC | PRN

## 2018-01-06 MED ORDER — LACTATED RINGERS IV SOLN
INTRAVENOUS | Status: DC | PRN
  Administered 2018-01-06 (×3): via INTRAVENOUS

## 2018-01-06 MED ORDER — DEXAMETHASONE SODIUM PHOSPHATE 10 MG/ML IJ SOLN
INTRAMUSCULAR | Status: DC | PRN
  Administered 2018-01-06: 10 mg via INTRAVENOUS

## 2018-01-06 MED ORDER — ZOLPIDEM TARTRATE 5 MG PO TABS: 5.0000 mg | ORAL_TABLET | Freq: Every evening | ORAL | Status: DC | PRN

## 2018-01-06 MED ORDER — FENTANYL CITRATE (PF) 100 MCG/2ML IJ SOLN
INTRAMUSCULAR | Status: DC | PRN
  Administered 2018-01-06: 15 ug via INTRATHECAL

## 2018-01-06 MED ORDER — PHENYLEPHRINE 8 MG IN D5W 100 ML (0.08MG/ML) PREMIX OPTIME
INJECTION | INTRAVENOUS | Status: AC
  Filled 2018-01-06: qty 100

## 2018-01-06 MED ORDER — ATROPINE SULFATE 1 MG/10ML IJ SOSY
PREFILLED_SYRINGE | INTRAMUSCULAR | Status: AC
  Filled 2018-01-06: qty 10

## 2018-01-06 MED ORDER — MENTHOL 3 MG MT LOZG: 1.0000 | LOZENGE | OROMUCOSAL | Status: DC | PRN

## 2018-01-06 MED ORDER — EPHEDRINE 5 MG/ML INJ
INTRAVENOUS | Status: AC
  Filled 2018-01-06: qty 10

## 2018-01-06 MED ORDER — NALOXONE HCL 0.4 MG/ML IJ SOLN: 0.4000 mg | INTRAMUSCULAR | Status: DC | PRN

## 2018-01-06 MED ORDER — OXYCODONE HCL 5 MG PO TABS
10.0000 mg | ORAL_TABLET | ORAL | Status: DC | PRN
  Administered 2018-01-08 (×2): 10 mg via ORAL
  Filled 2018-01-06: qty 2

## 2018-01-06 MED ORDER — DIPHENHYDRAMINE HCL 25 MG PO CAPS: 25.0000 mg | ORAL_CAPSULE | Freq: Four times a day (QID) | ORAL | Status: DC | PRN

## 2018-01-06 MED ORDER — SOD CITRATE-CITRIC ACID 500-334 MG/5ML PO SOLN
30.0000 mL | ORAL | Status: AC
  Administered 2018-01-06: 30 mL via ORAL

## 2018-01-06 MED ORDER — BUPIVACAINE HCL 0.25 % IJ SOLN
INTRAMUSCULAR | Status: DC | PRN
  Administered 2018-01-06: 30 mL

## 2018-01-06 MED ORDER — DIBUCAINE 1 % RE OINT: 1.0000 "application " | TOPICAL_OINTMENT | RECTAL | Status: DC | PRN

## 2018-01-06 MED ORDER — BUPIVACAINE IN DEXTROSE 0.75-8.25 % IT SOLN
INTRATHECAL | Status: DC | PRN
  Administered 2018-01-06: 1.6 mL via INTRATHECAL

## 2018-01-06 MED ORDER — MORPHINE SULFATE (PF) 0.5 MG/ML IJ SOLN
INTRAMUSCULAR | Status: DC | PRN
  Administered 2018-01-06: .15 mg via INTRATHECAL

## 2018-01-06 MED ORDER — ONDANSETRON HCL 4 MG/2ML IJ SOLN: 4.0000 mg | Freq: Three times a day (TID) | INTRAMUSCULAR | Status: DC | PRN

## 2018-01-06 MED ORDER — NALBUPHINE HCL 10 MG/ML IJ SOLN: 5.0000 mg | INTRAMUSCULAR | Status: DC | PRN

## 2018-01-06 MED ORDER — SODIUM CHLORIDE 0.9% FLUSH: 3.0000 mL | INTRAVENOUS | Status: DC | PRN

## 2018-01-06 MED ORDER — DIPHENHYDRAMINE HCL 50 MG/ML IJ SOLN: 12.5000 mg | INTRAMUSCULAR | Status: DC | PRN

## 2018-01-06 MED ORDER — ONDANSETRON HCL 4 MG/2ML IJ SOLN
INTRAMUSCULAR | Status: AC
  Filled 2018-01-06: qty 2

## 2018-01-06 MED ORDER — SCOPOLAMINE 1 MG/3DAYS TD PT72
MEDICATED_PATCH | TRANSDERMAL | Status: AC
  Administered 2018-01-06: 1.5 mg
  Filled 2018-01-06: qty 1

## 2018-01-06 MED ORDER — OXYTOCIN 10 UNIT/ML IJ SOLN
INTRAVENOUS | Status: DC | PRN
  Administered 2018-01-06: 40 [IU] via INTRAVENOUS

## 2018-01-06 MED ORDER — NALBUPHINE HCL 10 MG/ML IJ SOLN: 5.0000 mg | Freq: Once | INTRAMUSCULAR | Status: DC | PRN

## 2018-01-06 MED ORDER — NALOXONE HCL 4 MG/10ML IJ SOLN
1.0000 ug/kg/h | INTRAVENOUS | Status: DC | PRN
  Filled 2018-01-06: qty 5

## 2018-01-06 MED ORDER — SIMETHICONE 80 MG PO CHEW
80.0000 mg | CHEWABLE_TABLET | Freq: Three times a day (TID) | ORAL | Status: DC
  Administered 2018-01-06 – 2018-01-09 (×8): 80 mg via ORAL
  Filled 2018-01-06 (×7): qty 1

## 2018-01-06 MED ORDER — WITCH HAZEL-GLYCERIN EX PADS: 1.0000 "application " | MEDICATED_PAD | CUTANEOUS | Status: DC | PRN

## 2018-01-06 MED ORDER — KETOROLAC TROMETHAMINE 30 MG/ML IJ SOLN
INTRAMUSCULAR | Status: AC
  Administered 2018-01-06: 30 mg via INTRAVENOUS
  Filled 2018-01-06: qty 1

## 2018-01-06 MED ORDER — MEPERIDINE HCL 25 MG/ML IJ SOLN: 6.2500 mg | INTRAMUSCULAR | Status: DC | PRN

## 2018-01-06 MED ORDER — ATROPINE SULFATE 0.4 MG/ML IJ SOLN
INTRAMUSCULAR | Status: DC | PRN
  Administered 2018-01-06: 0.4 mg via INTRAVENOUS

## 2018-01-06 MED ORDER — OXYCODONE HCL 5 MG PO TABS: 5.0000 mg | ORAL_TABLET | Freq: Once | ORAL | Status: DC | PRN

## 2018-01-06 MED ORDER — OXYTOCIN 10 UNIT/ML IJ SOLN
INTRAMUSCULAR | Status: AC
  Filled 2018-01-06: qty 4

## 2018-01-06 MED ORDER — CEFAZOLIN SODIUM-DEXTROSE 2-4 GM/100ML-% IV SOLN
2.0000 g | INTRAVENOUS | Status: AC
  Administered 2018-01-06: 2 g via INTRAVENOUS

## 2018-01-06 MED ORDER — TRANEXAMIC ACID-NACL 1000-0.7 MG/100ML-% IV SOLN
INTRAVENOUS | Status: DC | PRN
  Administered 2018-01-06: 1000 mg via INTRAVENOUS

## 2018-01-06 MED ORDER — DEXAMETHASONE SODIUM PHOSPHATE 10 MG/ML IJ SOLN
INTRAMUSCULAR | Status: AC
  Filled 2018-01-06: qty 1

## 2018-01-06 MED ORDER — SENNOSIDES-DOCUSATE SODIUM 8.6-50 MG PO TABS
2.0000 | ORAL_TABLET | ORAL | Status: DC
  Administered 2018-01-07 – 2018-01-08 (×3): 2 via ORAL
  Filled 2018-01-06 (×3): qty 2

## 2018-01-06 MED ORDER — SODIUM CHLORIDE 0.9 % IR SOLN
Status: DC | PRN
  Administered 2018-01-06: 1

## 2018-01-06 MED ORDER — DIPHENHYDRAMINE HCL 25 MG PO CAPS
25.0000 mg | ORAL_CAPSULE | ORAL | Status: DC | PRN
  Filled 2018-01-06: qty 1

## 2018-01-06 MED ORDER — TETANUS-DIPHTH-ACELL PERTUSSIS 5-2.5-18.5 LF-MCG/0.5 IM SUSP: 0.5000 mL | Freq: Once | INTRAMUSCULAR | Status: DC

## 2018-01-06 MED ORDER — SIMETHICONE 80 MG PO CHEW
80.0000 mg | CHEWABLE_TABLET | ORAL | Status: DC
  Administered 2018-01-08 (×2): 80 mg via ORAL
  Filled 2018-01-06 (×3): qty 1

## 2018-01-06 MED ORDER — PRENATAL MULTIVITAMIN CH
1.0000 | ORAL_TABLET | Freq: Every day | ORAL | Status: DC
  Administered 2018-01-07 – 2018-01-09 (×3): 1 via ORAL
  Filled 2018-01-06 (×3): qty 1

## 2018-01-06 MED ORDER — ACETAMINOPHEN 325 MG PO TABS
650.0000 mg | ORAL_TABLET | ORAL | Status: DC | PRN
  Administered 2018-01-07 – 2018-01-08 (×4): 650 mg via ORAL
  Filled 2018-01-06 (×4): qty 2

## 2018-01-06 MED ORDER — OXYTOCIN 40 UNITS IN LACTATED RINGERS INFUSION - SIMPLE MED: 2.5000 [IU]/h | INTRAVENOUS | Status: AC

## 2018-01-06 MED ORDER — FENTANYL CITRATE (PF) 100 MCG/2ML IJ SOLN
INTRAMUSCULAR | Status: AC
  Filled 2018-01-06: qty 2

## 2018-01-06 MED ORDER — PROMETHAZINE HCL 25 MG/ML IJ SOLN: 6.2500 mg | INTRAMUSCULAR | Status: DC | PRN

## 2018-01-06 MED ORDER — MORPHINE SULFATE (PF) 0.5 MG/ML IJ SOLN
INTRAMUSCULAR | Status: AC
  Filled 2018-01-06: qty 10

## 2018-01-06 MED ORDER — BUPIVACAINE HCL (PF) 0.25 % IJ SOLN
INTRAMUSCULAR | Status: AC
  Filled 2018-01-06: qty 30

## 2018-01-06 MED ORDER — SIMETHICONE 80 MG PO CHEW: 80.0000 mg | CHEWABLE_TABLET | ORAL | Status: DC | PRN

## 2018-01-06 MED ORDER — PHENYLEPHRINE 8 MG IN D5W 100 ML (0.08MG/ML) PREMIX OPTIME
INJECTION | INTRAVENOUS | Status: DC | PRN
  Administered 2018-01-06: 60 ug/min via INTRAVENOUS

## 2018-01-06 MED ORDER — OXYCODONE HCL 5 MG PO TABS
5.0000 mg | ORAL_TABLET | ORAL | Status: DC | PRN
  Administered 2018-01-07 – 2018-01-09 (×5): 5 mg via ORAL
  Filled 2018-01-06 (×7): qty 1

## 2018-01-06 MED ORDER — TRANEXAMIC ACID-NACL 1000-0.7 MG/100ML-% IV SOLN
INTRAVENOUS | Status: AC
  Filled 2018-01-06: qty 100

## 2018-01-06 MED ORDER — SOD CITRATE-CITRIC ACID 500-334 MG/5ML PO SOLN
ORAL | Status: AC
  Filled 2018-01-06: qty 15

## 2018-01-06 MED ORDER — LIDOCAINE HCL (PF) 1 % IJ SOLN
INTRAMUSCULAR | Status: AC
  Filled 2018-01-06: qty 5

## 2018-01-06 MED ORDER — ENOXAPARIN SODIUM 60 MG/0.6ML ~~LOC~~ SOLN
55.0000 mg | SUBCUTANEOUS | Status: DC
  Administered 2018-01-07 – 2018-01-09 (×3): 55 mg via SUBCUTANEOUS
  Filled 2018-01-06 (×4): qty 0.6

## 2018-01-06 MED ORDER — EPHEDRINE SULFATE 50 MG/ML IJ SOLN
INTRAMUSCULAR | Status: DC | PRN
  Administered 2018-01-06: 15 mg via INTRAVENOUS
  Administered 2018-01-06: 10 mg via INTRAVENOUS

## 2018-01-06 MED ORDER — OXYCODONE HCL 5 MG/5ML PO SOLN: 5.0000 mg | Freq: Once | ORAL | Status: DC | PRN

## 2018-01-06 MED ORDER — IBUPROFEN 600 MG PO TABS
600.0000 mg | ORAL_TABLET | Freq: Four times a day (QID) | ORAL | Status: DC
  Administered 2018-01-06 – 2018-01-09 (×12): 600 mg via ORAL
  Filled 2018-01-06 (×13): qty 1

## 2018-01-06 SURGICAL SUPPLY — 32 items
APL SKNCLS STERI-STRIP NONHPOA (GAUZE/BANDAGES/DRESSINGS) ×1
BENZOIN TINCTURE PRP APPL 2/3 (GAUZE/BANDAGES/DRESSINGS) ×2 IMPLANT
CHLORAPREP W/TINT 26ML (MISCELLANEOUS) ×2 IMPLANT
CLOTH BEACON ORANGE TIMEOUT ST (SAFETY) ×2 IMPLANT
DECANTER SPIKE VIAL GLASS SM (MISCELLANEOUS) ×1 IMPLANT
DRESSING PREVENA PLUS CUSTOM (GAUZE/BANDAGES/DRESSINGS) IMPLANT
DRSG OPSITE POSTOP 4X10 (GAUZE/BANDAGES/DRESSINGS) ×2 IMPLANT
DRSG PREVENA PLUS CUSTOM (GAUZE/BANDAGES/DRESSINGS) ×2
ELECT REM PT RETURN 9FT ADLT (ELECTROSURGICAL) ×2
ELECTRODE REM PT RTRN 9FT ADLT (ELECTROSURGICAL) ×1 IMPLANT
GLOVE BIOGEL PI IND STRL 7.0 (GLOVE) ×3 IMPLANT
GLOVE BIOGEL PI INDICATOR 7.0 (GLOVE) ×3
GLOVE ECLIPSE 6.5 STRL STRAW (GLOVE) ×2 IMPLANT
GOWN STRL REUS W/ TWL LRG LVL3 (GOWN DISPOSABLE) ×2 IMPLANT
GOWN STRL REUS W/TWL LRG LVL3 (GOWN DISPOSABLE) ×4
HOVERMATT SINGLE USE (MISCELLANEOUS) ×1 IMPLANT
NEEDLE HYPO 22GX1.5 SAFETY (NEEDLE) ×2 IMPLANT
NS IRRIG 1000ML POUR BTL (IV SOLUTION) ×2 IMPLANT
PAD OB MATERNITY 4.3X12.25 (PERSONAL CARE ITEMS) ×2 IMPLANT
PAD PREP 24X48 CUFFED NSTRL (MISCELLANEOUS) ×2 IMPLANT
RETRACTOR WND ALEXIS 25 LRG (MISCELLANEOUS) IMPLANT
RTRCTR WOUND ALEXIS 25CM LRG (MISCELLANEOUS)
SPONGE LAP 18X18 RF (DISPOSABLE) ×6 IMPLANT
STRIP CLOSURE SKIN 1/2X4 (GAUZE/BANDAGES/DRESSINGS) ×2 IMPLANT
SUT PLAIN 2 0 XLH (SUTURE) ×2 IMPLANT
SUT VIC AB 0 CT1 36 (SUTURE) ×4 IMPLANT
SUT VIC AB 2-0 CT1 27 (SUTURE) ×2
SUT VIC AB 2-0 CT1 TAPERPNT 27 (SUTURE) ×1 IMPLANT
SUT VIC AB 4-0 KS 27 (SUTURE) ×2 IMPLANT
SYR CONTROL 10ML LL (SYRINGE) ×2 IMPLANT
TOWEL OR 17X24 6PK STRL BLUE (TOWEL DISPOSABLE) ×6 IMPLANT
TRAY FOLEY CATH SILVER 16FR (SET/KITS/TRAYS/PACK) ×2 IMPLANT

## 2018-01-06 NOTE — Anesthesia Postprocedure Evaluation (Signed)
Anesthesia Post Note  Patient: Krista Hogan  Procedure(s) Performed: CESAREAN SECTION (N/A )     Patient location during evaluation: Mother Baby Anesthesia Type: Spinal Level of consciousness: awake and alert Pain management: pain level controlled Vital Signs Assessment: post-procedure vital signs reviewed and stable Respiratory status: spontaneous breathing, nonlabored ventilation and respiratory function stable Cardiovascular status: stable Postop Assessment: no headache, no backache, spinal receding, able to ambulate, adequate PO intake, no apparent nausea or vomiting and patient able to bend at knees Anesthetic complications: no    Last Vitals:  Vitals:   01/06/18 1405 01/06/18 1504  BP: 101/65 104/67  Pulse: 60 63  Resp: 18 17  Temp:    SpO2: 99% 100%    Last Pain:  Vitals:   01/06/18 1605  TempSrc:   PainSc: 4    Pain Goal:                 [employer]

## 2018-01-06 NOTE — Discharge Summary (Addendum)
Obstetrics Discharge Summary OB/[facility]N [facility]   Patient Name: Krista Hogan DOB: January 09, 1981 MRN: [id number]  Date of admission: 01/06/2018 Delivering MD: Caren Macadam   Date of discharge: 01/09/2018  Admitting diagnosis: Previous Myomectomy Intrauterine pregnancy: [redacted]w[redacted]d    Secondary diagnosis:   Active Problems:   Submucous leiomyoma of uterus   Graves disease   Supervision of other normal pregnancy, antepartum   S/P primary low transverse C-section   S/P repeat low transverse C-section     Discharge diagnosis: Term Pregnancy Delivered                                            Postpartum procedures: None Complications: None  Hospital course: Krista Hogan a 37y.o. 395w0dho was admitted for scheduled CS. Her pregnancy was complicated by leiomyoma s/p myomectomy, AMA, Graves disease and gDM. CS on 10/30 was without complication and EBL of [id number] see delivery/op note for additional details. Her postpartum course was uncomplicated.By day of discharge, she was passing flatus, urinating, eating and drinking without difficulty. There were technical difficulties with her Provena and her incisional dressing was changed to a honeycomb dressing on POD3. Her pain was well-controlled, and she was discharged home with oxycodone, tylenol, zofran and senna. She will follow-up in clinic in 1-2 weeks.   Physical exam  Vitals:   01/08/18 0518 01/08/18 1348 01/08/18 2300 01/09/18 0537  BP: 104/64 105/71 116/66 123/84  Pulse: 72 80 77 78  Resp: '18 18 16 18  ' Temp: 98.5 F (36.9 C) 98.5 F (36.9 C) 98.2 F (36.8 C) 98 F (36.7 C)  TempSrc: Oral Oral Oral Oral  SpO2:   98% 100%  Weight:      Height:       General: well appearing, lying comfortably in bed Lochia: appropriate Uterine Fundus: firm Incision: Healing well with no significant drainage, Dressing is clean, dry, and intact DVT Evaluation: No evidence of DVT seen on physical exam. Negative Homan's  sign. Labs: Lab Results  Component Value Date   WBC 16.0 (H) 01/07/2018   HGB 10.1 (L) 01/07/2018   HCT 30.0 (L) 01/07/2018   MCV 89.8 01/07/2018   PLT 272 01/07/2018   CMP Latest Ref Rng & Units 04/08/2007  Glucose 70 - 99 mg/dL 94  BUN 6 - 23 mg/dL 14  Creatinine 0.40 - 1.20 mg/dL 0.62  Sodium 135 - 145 meq/L 140  Potassium 3.5 - 5.3 meq/L 3.8  Chloride 96 - 112 meq/L 109  CO2 19 - 32 meq/L 21  Calcium 8.4 - 10.5 mg/dL 8.7  Total Protein 6.0 - 8.3 g/dL 6.9  Total Bilirubin 0.3 - 1.2 mg/dL 0.3  Alkaline Phos 39 - 117 units/L 68  AST 0 - 37 units/L 11  ALT 0 - 35 units/L <8 U/L    Discharge instructions: Per After Visit Summary and "Baby and Me Booklet"  After visit meds:  Allergies as of 01/09/2018   No Known Allergies     Medication List    TAKE these medications   ACCU-CHEK AVIVA PLUS w/Device Kit 1 Device by Percutaneous route 4 (four) times a week.   ACCU-CHEK FASTCLIX LANCETS Misc 1 Stick by Percutaneous route 4 (four) times daily.   acetaminophen 500 MG tablet Commonly known as:  TYLENOL Take 500 mg by mouth every 6 (six) hours as needed for moderate pain.  cetirizine 10 MG tablet Commonly known as:  ZYRTEC Take 10 mg by mouth daily.   cholecalciferol 1000 units tablet Commonly known as:  VITAMIN D Take 1,000 Units by mouth daily.   CITRANATAL RX 27-1 MG Tabs Take 1 tablet by mouth daily before breakfast.   COMFORT FIT MATERNITY SUPP SM Misc Wear as directed.   cyanocobalamin 1000 MCG tablet Take 1,000 mcg by mouth daily.   Fish Oil 1000 MG Caps Take 1,000 mg by mouth daily.   glucose blood test strip Use to check blood sugars four times a day was instructed   ibuprofen 600 MG tablet Commonly known as:  ADVIL,MOTRIN Take 1 tablet (600 mg total) by mouth every 6 (six) hours.   ondansetron 8 MG disintegrating tablet Commonly known as:  ZOFRAN-ODT Take 1 tablet (8 mg total) by mouth every 8 (eight) hours as needed for nausea or vomiting.    oxyCODONE 5 MG immediate release tablet Commonly known as:  Oxy IR/ROXICODONE Take 1 tablet (5 mg total) by mouth every 4 (four) hours as needed (pain scale 4-7).   senna-docusate 8.6-50 MG tablet Commonly known as:  Senokot-S Take 2 tablets by mouth daily. Start taking on:  01/10/2018       Postpartum contraception: Combination OCPs Diet: Routine Diet Activity: Advance as tolerated. Pelvic rest for 6 weeks.   Outpatient follow up:1-2 weeks Follow-up Appt: Future Appointments  Date Time Provider [place]  01/20/2018  1:45 PM Shelly Bombard, MD [facility] None  02/03/2018  1:30 PM Shelly Bombard, MD [place] None  05/07/2018 10:30 AM Philemon Kingdom, MD [facility] None   Follow-up Visit:No follow-ups on file.  Newborn Data: Live born female  Birth Weight: 6 lb 1.4 oz (2760 g) APGAR: 8, 8  Newborn Delivery   Birth date/time:  01/06/2018 10:58:00 Delivery type:  C-Section, Low Transverse Trial of labor:  No C-section categorization:  Primary     Baby Feeding: Breast Disposition:home with mother  Krista Plum, MD Family Medicine Resident  CNM attestation I have seen and examined this patient and agree with above documentation in the resident's note.   Krista Hogan is a 37 y.o. G2P1011 s/p pLTCS.   Pain is well controlled.  Plan for birth control is oral progesterone-only contraceptive.  Method of Feeding: breast  PE:  BP 123/84 (BP Location: Right Arm)   Pulse 78   Temp 98 F (36.7 C) (Oral)   Resp 18   Ht '5\' 3"'  (1.6 m)   Wt 107.7 kg   LMP 04/22/2017 (LMP Unknown)   SpO2 100%   Breastfeeding? Unknown   BMI 42.07 kg/m  Fundus firm Provena not functioning correctly; will replace with honeycomb prior to d/c  Recent Labs    01/10/18 2030  HGB 11.1*  HCT 34.3*     Plan: discharge today - postpartum care discussed - f/u clinic in 1 week for incision check; then 4wks for postpartum visit   Serita Grammes, CNM 12:57 PM

## 2018-01-06 NOTE — Transfer of Care (Signed)
Immediate Anesthesia Transfer of Care Note  Patient: Krista Hogan  Procedure(s) Performed: CESAREAN SECTION (N/A )  Patient Location: PACU  Anesthesia Type:Spinal  Level of Consciousness: awake  Airway & Oxygen Therapy: Patient Spontanous Breathing  Post-op Assessment: Report given to RN and Post -op Vital signs reviewed and stable  Post vital signs: stable  Last Vitals:  Vitals Value Taken Time  BP    Temp    Pulse 65 01/06/2018 11:46 AM  Resp    SpO2 93 % 01/06/2018 11:46 AM  Vitals shown include unvalidated device data.  Last Pain: There were no vitals filed for this visit.       Complications: No apparent anesthesia complications

## 2018-01-06 NOTE — Lactation Note (Signed)
This note was copied from a baby's chart. Lactation Consultation Note Baby 83 hrs old.  RN stated baby isn't interested in suckling on breast. Baby did take bottle w/formula.  Mom has large breast w/short shaft nipples Rt. Breast has areola edema. Reverse pressure helpful, compression is a thick nipple. Lt. Nipple more compressible. Encouraged reverse pressure and finger stimulation as well as hand pump before latching. Gave mom hand pump and shells. Mom applied shells w/bra. Discussed mom getting a large size bra w/no under wire. Mom has pacifier at bedside. Discouraged pacifier for 2 weeks. Discussed newborn behavior, feeding habits, STS, I&O, cluster feeding, and supply and demand. Mom has DEBP at bedside. Encouraged to use q 3 hrs. After feeding and supplement when colostrum is collected. Dicussed w/mom supplementation d/t baby will be less than 6 lbs for next wt. Check. Encouraged mom to BF first, give colostrum then formula to equal amount needed. Report to RN.  Patient Name: Krista Hogan [id number] Date: 01/06/2018 Reason for consult: Follow-up assessment;Early term 37-38.6wks   Maternal Data Has patient been taught Hand Expression?: Yes  Feeding Feeding Type: Bottle Fed - Formula Nipple Type: Slow - flow  LATCH Score Latch: Repeated attempts needed to sustain latch, nipple held in mouth throughout feeding, stimulation needed to elicit sucking reflex.  Audible Swallowing: None  Type of Nipple: Everted at rest and after stimulation  Comfort (Breast/Nipple): Soft / non-tender  Hold (Positioning): Full assist, staff holds infant at breast  LATCH Score: 5  Interventions Interventions: Breast feeding basics reviewed;Support pillows;Breast massage;Hand express;Shells;Pre-pump if needed;Hand pump;Reverse pressure;Breast compression  Lactation Tools Discussed/Used Tools: Shells;Pump Shell Type: Inverted Breast pump type: Double-Electric Breast Pump;Manual Pump Review: Milk  Storage Initiated by:: ER Date initiated:: 01/06/18   Consult Status Consult Status: Follow-up Date: 01/07/18 Follow-up type: In-patient    Theodoro Kalata 01/06/2018, 10:25 PM

## 2018-01-06 NOTE — Addendum Note (Signed)
Addendum  created 01/06/18 1801 by Hewitt Blade, CRNA   Charge Capture section accepted, Sign clinical note

## 2018-01-06 NOTE — Anesthesia Postprocedure Evaluation (Signed)
Anesthesia Post Note  Patient: Ashantia A Merced  Procedure(s) Performed: CESAREAN SECTION (N/A )     Patient location during evaluation: PACU Anesthesia Type: Spinal Level of consciousness: oriented and awake and alert Pain management: pain level controlled Vital Signs Assessment: post-procedure vital signs reviewed and stable Respiratory status: spontaneous breathing and respiratory function stable Cardiovascular status: blood pressure returned to baseline and stable Postop Assessment: no headache, no backache and no apparent nausea or vomiting Anesthetic complications: no    Last Vitals:  Vitals:   01/06/18 1245 01/06/18 1302  BP: (!) 102/57 (!) 99/55  Pulse: 60 63  Resp: 20 15  Temp:  36.5 C  SpO2: 100% 100%    Last Pain:  Vitals:   01/06/18 1305  TempSrc:   PainSc: 5    Pain Goal:                 Lynda Rainwater

## 2018-01-06 NOTE — H&P (Signed)
LABOR AND DELIVERY ADMISSION HISTORY AND PHYSICAL NOTE  Krista Hogan is a 37 y.o. female G46P0010 with IUP at 5w0dby -/8 presenting for scheduled CS.  She reports positive fetal movement. She denies leakage of fluid or vaginal bleeding.  Prenatal History/Complications: PNC at [facility] Pregnancy complications:  - Leiomyoma of uterus s/p myomectomy - AMA (37yo) - Graves disease - GDM  Past Medical History: Past Medical History:  Diagnosis Date  . DUB (dysfunctional uterine bleeding)   . Graves disease 06/2016  . Graves disease   . History of abnormal cervical Pap smear    2011  . History of HPV infection   . Uterine fibroid   . Wears glasses     Past Surgical History: Past Surgical History:  Procedure Laterality Date  . EXCISION OF SKIN TAG  11/22/2014   Procedure: EXCISION OF SKIN TAG VULVA;  Surgeon: TGovernor Specking MD;  [facility] [facility]  Service: Gynecology;;  . LAPAROSCOPIC GELPORT ASSISTED MYOMECTOMY N/A 11/22/2014   Procedure: LAPAROSCOPIC GELPORT ASSISTED MYOMECTOMY WITH CHROMOTUBATION;  Surgeon: TGovernor Specking MD;  Location[place] [facility]  Service: Gynecology;  Laterality: N/A;  . LAPAROSCOPY N/A 11/22/2014   Procedure: LAPAROSCOPY DIAGNOSTIC;  Surgeon: TGovernor Specking MD;  Location: [facility]  Service: Gynecology;  Laterality: N/A;  . LEEP  2011  . TONSILLECTOMY AND ADENOIDECTOMY  age 37 . WISDOM TOOTH EXTRACTION  2006    Obstetrical History: OB History    Gravida  2   Para  0   Term  0   Preterm  0   AB  1   Living  0     SAB  1   TAB  0   Ectopic  0   Multiple  0   Live Births              Social History: Social History   Socioeconomic History  . Marital status: Single    Spouse name: Not on file  . Number of children: Not on file  . Years of education: Not on file  . Highest education level: Not on file  Occupational History  . Not on file  Social Needs  .  Financial resource strain: Not hard at all  . Food insecurity:    Worry: Never true    Inability: Never true  . Transportation needs:    Medical: No    Non-medical: Not on file  Tobacco Use  . Smoking status: Former Smoker    Packs/day: 0.25    Years: 10.00    Pack years: 2.50    Types: Cigarettes    Last attempt to quit: 04/2017    Years since quitting: 0.7  . Smokeless tobacco: Never Used  Substance and Sexual Activity  . Alcohol use: Yes    Alcohol/week: 0.0 standard drinks    Comment: SOCIAL  . Drug use: No  . Sexual activity: Yes    Birth control/protection: None  Lifestyle  . Physical activity:    Days per week: Not on file    Minutes per session: Not on file  . Stress: To some extent  Relationships  . Social connections:    Talks on phone: Not on file    Gets together: Not on file    Attends religious service: Not on file    Active member of club or organization: Not on file    Attends meetings of clubs or organizations: Not on file    Relationship status:  Not on file  Other Topics Concern  . Not on file  Social History Narrative  . Not on file    Family History: Family History  Problem Relation Age of Onset  . Fibroids Mother   . Thyroid disease Neg Hx     Allergies: No Known Allergies  Medications Prior to Admission  Medication Sig Dispense Refill Last Dose  . acetaminophen (TYLENOL) 500 MG tablet Take 500 mg by mouth every 6 (six) hours as needed for moderate pain.   Taking  . cetirizine (ZYRTEC) 10 MG tablet Take 10 mg by mouth daily.   Taking  . cholecalciferol (VITAMIN D) 1000 units tablet Take 1,000 Units by mouth daily.   Taking  . cyanocobalamin 1000 MCG tablet Take 1,000 mcg by mouth daily.   Taking  . Omega-3 Fatty Acids (FISH OIL) 1000 MG CAPS Take 1,000 mg by mouth daily.    Taking  . ondansetron (ZOFRAN ODT) 8 MG disintegrating tablet Take 1 tablet (8 mg total) by mouth every 8 (eight) hours as needed for nausea or vomiting. 20 tablet 0  Taking  . Prenat w/o A-FeCb-FeGl-DSS-FA (CITRANATAL RX) 27-1 MG TABS Take 1 tablet by mouth daily before breakfast. 90 each 4 Taking  . ACCU-CHEK FASTCLIX LANCETS MISC 1 Stick by Percutaneous route 4 (four) times daily. 100 each 12 Taking  . Blood Glucose Monitoring Suppl (ACCU-CHEK AVIVA PLUS) w/Device KIT 1 Device by Percutaneous route 4 (four) times a week. 1 kit 0 Taking  . Elastic Bandages & Supports (COMFORT FIT MATERNITY SUPP SM) MISC Wear as directed. 1 each 0 Taking  . glucose blood (ACCU-CHEK GUIDE) test strip Use to check blood sugars four times a day was instructed 50 each 12 Taking     Review of Systems  All systems reviewed and negative except as stated in HPI  Physical Exam Last menstrual period 04/22/2017. General appearance: alert, oriented, NAD Lungs: normal respiratory effort Heart: regular rate Abdomen: soft, non-tender; gravid, FH appropriate for GA Extremities: No calf swelling or tenderness     Prenatal labs: ABO, Rh: --/--/O POS, O POS Performed at [facility], [address], [place], [place] [address]  [phone]/29 1032) Antibody: NEG (10/29 1032) Rubella: 19.20 (05/01 1408) RPR: Non Reactive (10/29 1032)  HBsAg: Negative (05/01 1408)  HIV: Non Reactive (09/24 1003)  GC/Chlamydia: Ordered today GBS:   NA 2-hr GTT: Positive for GDM Genetic screening:  Declined Anatomy US: Normal  Prenatal Transfer Tool  Maternal Diabetes: Yes:  Diabetes Type:  Diet controlled Genetic Screening: Normal Maternal Ultrasounds/Referrals: Normal Fetal Ultrasounds or other Referrals:  None Maternal Substance Abuse:  No Significant Maternal Medications:  None Significant Maternal Lab Results: Lab values include: Group B Strep negative  Results for orders placed or performed during the hospital encounter of 01/05/18 (from the past 24 hour(s))  CBC   Collection Time: 01/05/18 10:32 AM  Result Value Ref Range   WBC 7.5 4.0 - 10.5 K/uL   RBC 4.02 3.87 - 5.11 MIL/uL    Hemoglobin 11.9 (L) 12.0 - 15.0 g/dL   HCT 35.3 (L) 36.0 - 46.0 %   MCV 87.8 80.0 - 100.0 fL   MCH 29.6 26.0 - 34.0 pg   MCHC 33.7 30.0 - 36.0 g/dL   RDW 15.1 11.5 - 15.5 %   Platelets 229 150 - 400 K/uL   nRBC 0.0 0.0 - 0.2 %  RPR   Collection Time: 01/05/18 10:32 AM  Result Value Ref Range   RPR Ser Ql  Non Reactive Non Reactive  Type and screen [place]   Collection Time: 01/05/18 10:32 AM  Result Value Ref Range   ABO/RH(D) O POS    Antibody Screen NEG    Sample Expiration      01/08/2018 Performed at [facility], [address], [place], [place] [address]   ABO/Rh   Collection Time: 01/05/18 10:32 AM  Result Value Ref Range   ABO/RH(D)      O POS Performed at [facility], [address], [place], [place] [address]     Patient Active Problem List   Diagnosis Date Noted  . Antepartum multigravida of advanced maternal age   . [redacted] weeks gestation of pregnancy   . Encounter for (NT) nuchal translucency scan   . Supervision of other normal pregnancy, antepartum 07/08/2017  . Graves disease 10/01/2016  . Chronic pelvic pain in female 11/15/2015  . 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    Assessment: Krista Hogan is a 37 y.o. G2P0010 at 84w0dhere for scheduled cesarean  #Labor: Scheduled CS given history of myomectomy #Pain: Per anesthesia #FWB: 64th% at 37w5dID: Ancef perioperatively  #MOF: Breast #MOC:OCPs #Circ:  NA  # A1GDM - Stable glucose control  # Graves disease - Stable on no meds  KiCaren MacadamMD, MPH, ABFM Attending [facility]

## 2018-01-06 NOTE — Lactation Note (Signed)
This note was copied from a baby's chart. Lactation Consultation Note  Patient Name: Girl Aalivia Mcgraw [id number] Date: 01/06/2018 Reason for consult: Initial assessment;Primapara;1st time breastfeeding;Early term 86-38.6wks  Visited with P1 Mom of ET infant at 38 hrs old.  Baby went to NICU to transition, and just transferred back to Mom.  Baby resting STS on Mom's chest.  Baby received one feeding of 12 ml of formula in NICU.  Mom to call when baby shows feeding cues.  Mom's nurse assisted with hand expression and trying to see if baby would latch recently.  Baby sleepy.  Talked to Mom about normal newborn 86 week behavior.  Talked about double pumping to stimulate milk supply.  Mom's eyes closing as LC talking.   Mom to call out for breastfeeding assistance when baby starts cueing.  Will set up DEBP after next feeding, and when Mom is more awake.  Baby has just been transferred from NICU and Mom enjoying having him STS on her chest.  Lactation brochure given to Mom.  Mom aware of IP and OP lactation support available to her.     Consult Status Consult Status: Follow-up Date: 01/07/18 Follow-up type: In-patient    Broadus John 01/06/2018, 5:21 PM

## 2018-01-06 NOTE — Progress Notes (Signed)
CBG taken in the PACU at 1240; CBG monitor did not recognize patient barcode. CBG was 76

## 2018-01-06 NOTE — Progress Notes (Signed)
Parent request formula to supplement breast feeding due to infant being sleepy and having difficulty latching.Parents have been informed of small tummy size of newborn, taught hand expression and understand the possible consequences of formula to the health of the infant. The possible consequences shared with patient include 1) Loss of confidence in breastfeeding 2) Engorgement 3) Allergic sensitization of baby(asthma/allergies) and 4) decreased milk supply for mother.After discussion of the above the mother decided to  supplement with formula.  The tool used to give formula supplement will be slow flow nipple.

## 2018-01-06 NOTE — Anesthesia Procedure Notes (Signed)
Spinal  Patient location during procedure: OB Start time: 01/06/2018 10:10 AM End time: 01/06/2018 10:15 AM Staffing Anesthesiologist: Lynda Rainwater, MD Performed: anesthesiologist  Preanesthetic Checklist Completed: patient identified, surgical consent, pre-op evaluation, timeout performed, IV checked, risks and benefits discussed and monitors and equipment checked Spinal Block Patient position: sitting Prep: site prepped and draped and DuraPrep Patient monitoring: heart rate, cardiac monitor, continuous pulse ox and blood pressure Approach: midline Location: L3-4 Injection technique: single-shot Needle Needle type: Pencan  Needle gauge: 24 G Needle length: 10 cm Assessment Sensory level: T4

## 2018-01-06 NOTE — Op Note (Signed)
Krista Hogan PROCEDURE DATE: 01/06/2018  PREOPERATIVE DIAGNOSES: Intrauterine pregnancy at [redacted]w[redacted]d weeks gestation; previous myomectomy  POSTOPERATIVE DIAGNOSES: The same  PROCEDURE: Primary Low Transverse Cesarean Section  SURGEON:  Dr. Lauretta Chester  INDICATIONS: Krista Hogan is a 37 y.o. G2P1011 at [redacted]w[redacted]d here for cesarean section secondary to the indications listed under preoperative diagnoses; please see preoperative note for further details.  The risks of cesarean section were discussed with the patient including but were not limited to: bleeding which may require transfusion or reoperation; infection which may require antibiotics; injury to bowel, bladder, ureters or other surrounding organs; injury to the fetus; need for additional procedures including hysterectomy in the event of a life-threatening hemorrhage; placental abnormalities wth subsequent pregnancies, incisional problems, thromboembolic phenomenon and other postoperative/anesthesia complications.   The patient concurred with the proposed plan, giving informed written consent for the procedure.    FINDINGS:  Viable female infant in cephalic presentation.  Apgars 8 and 8.  Clear amniotic fluid.  Intact placenta, three vessel cord.  Normal uterus, fallopian tubes and ovaries bilaterally. Slightly boggy uterus after delivery of infant given TXA and pitocin. Infant had low O2 sat and was take to NICU from OR.   ANESTHESIA: Spinal INTRAVENOUS FLUIDS: 2462ml ESTIMATED BLOOD LOSS: 265ml URINE OUTPUT:  185ml SPECIMENS: L&D COMPLICATIONS: None immediate  PROCEDURE IN DETAIL:  The patient preoperatively received intravenous antibiotics and had sequential compression devices applied to her lower extremities.  She was then taken to the operating room where spinal anesthesia was administered and was found to be adequate. She was then placed in a dorsal supine position with a leftward tilt, and prepped and draped in a sterile manner.   A foley catheter was placed into her bladder and attached to constant gravity.  After an adequate timeout was performed, a Pfannenstiel skin incision was made with scalpel and carried through to the underlying layer of fascia. The fascia was incised in the midline, and this incision was extended bilaterally using the Mayo scissors.  Kocher clamps were applied to the superior aspect of the fascial incision and the underlying rectus muscles were dissected off bluntly. A similar process was carried out on the inferior aspect of the fascial incision. The rectus muscles were separated in the midline bluntly and the peritoneum was entered bluntly. Attention was turned to the lower uterine segment where a low transverse hysterotomy was made with a scalpel and extended bilaterally bluntly.  The infant was successfully delivered, the cord was clamped and cut and the infant was handed over to awaiting neonatology team. Uterine massage was then administered, and the placenta delivered intact with a three-vessel cord. The uterus was then cleared of clot and debris.  The hysterotomy was closed with 0 Vicryl in a running locked fashion, and an imbricating layer was also placed with 0 Vicryl.  The pelvis was cleared of all clot and debris. Hemostasis was confirmed on all surfaces.  The peritoneum and the muscles were reapproximated using 0 Vicryl interrupted stitches. The fascia was then closed using 0 Vicryl in a running fashion.  The subcutaneous layer was then reapproximated with 2-0 plain gut interrupted stitches, and 30 ml of 0.5% Marcaine was injected subcutaneously around the incision.  The skin was closed with a 4-0 Vicryl subcuticular stitch. The patient tolerated the procedure well. Sponge, lap, instrument and needle counts were correct x 2.  She was taken to the recovery room in stable condition.   Caren Macadam, MD, MPH, ABFM Attending Physician  [place] for [employer]

## 2018-01-06 NOTE — Anesthesia Preprocedure Evaluation (Signed)
Anesthesia Evaluation  Patient identified by MRN, date of birth, ID band Patient awake    Reviewed: Allergy & Precautions, H&P , NPO status , Patient's Chart, lab work & pertinent test results  Airway Mallampati: II  TM Distance: >3 FB Neck ROM: full    Dental no notable dental hx. (+) Dental Advisory Given, Teeth Intact   Pulmonary neg pulmonary ROS, former smoker,    Pulmonary exam normal breath sounds clear to auscultation       Cardiovascular Exercise Tolerance: Good negative cardio ROS Normal cardiovascular exam Rhythm:regular Rate:Normal     Neuro/Psych negative neurological ROS  negative psych ROS   GI/Hepatic negative GI ROS, Neg liver ROS,   Endo/Other  Morbid obesity  Renal/GU negative Renal ROS  negative genitourinary   Musculoskeletal   Abdominal   Peds  Hematology negative hematology ROS (+)   Anesthesia Other Findings   Reproductive/Obstetrics (+) Pregnancy                             Anesthesia Physical  Anesthesia Plan  ASA: III  Anesthesia Plan: Spinal   Post-op Pain Management:    Induction:   PONV Risk Score and Plan: 2 and Treatment may vary due to age or medical condition  Airway Management Planned: Natural Airway  Additional Equipment:   Intra-op Plan:   Post-operative Plan:   Informed Consent: I have reviewed the patients History and Physical, chart, labs and discussed the procedure including the risks, benefits and alternatives for the proposed anesthesia with the patient or authorized representative who has indicated his/her understanding and acceptance.   Dental Advisory Given  Plan Discussed with: CRNA and Surgeon  Anesthesia Plan Comments:         Anesthesia Quick Evaluation

## 2018-01-07 LAB — CBC
HEMATOCRIT: 30 % — AB (ref 36.0–46.0)
Hemoglobin: 10.1 g/dL — ABNORMAL LOW (ref 12.0–15.0)
MCH: 30.2 pg (ref 26.0–34.0)
MCHC: 33.7 g/dL (ref 30.0–36.0)
MCV: 89.8 fL (ref 80.0–100.0)
PLATELETS: 272 10*3/uL (ref 150–400)
RBC: 3.34 MIL/uL — ABNORMAL LOW (ref 3.87–5.11)
RDW: 14.9 % (ref 11.5–15.5)
WBC: 16 10*3/uL — ABNORMAL HIGH (ref 4.0–10.5)

## 2018-01-07 LAB — BIRTH TISSUE RECOVERY COLLECTION (PLACENTA DONATION)

## 2018-01-07 LAB — STREP GP B NAA: STREP GROUP B AG: POSITIVE — AB

## 2018-01-07 NOTE — Lactation Note (Signed)
This note was copied from a baby's chart. Lactation Consultation Note  Patient Name: Krista Hogan [id number] Date: 01/07/2018 Reason for consult: Follow-up assessment;1st time breastfeeding;Primapara;Infant < 6lbs   Follow up with mom of 73 hour old infant. Infant with BF x 1 of 15 minutes, 4 BF attempts, formula x 6 of 5-18 cc, 5 voids, 4 stools, and 3 emesis episodes in the last 24 hours. LATCH Scores 4-5. Infant weight 5 pounds 14.7 ounces with 3% weight loss since birth.   Mom was attempting to latch infant to the right breast when [place] arrived. Infant pulling on and off. Assisted with latch and positioing, infant would suckle for short bursts and pull off. Infant was then supplemented with bottle of formula.   Mom has been pumping and has not obtained colostrum. Enc mom to follow pumping with hand expression. Reviewed supply and demand and milk coming to volume.   Mom given supplementation chart, enc mom to increase volumes of supplement based on day of age. Mom voiced understanding.   Mom reports she has no further questions/concerns at this time. Enc mom to call out for assistance with feeding as needed.    Maternal Data Has patient been taught Hand Expression?: Yes Does the patient have breastfeeding experience prior to this delivery?: No  Feeding Feeding Type: Breast Fed Nipple Type: Slow - flow  LATCH Score Latch: Repeated attempts needed to sustain latch, nipple held in mouth throughout feeding, stimulation needed to elicit sucking reflex.  Audible Swallowing: None  Type of Nipple: Everted at rest and after stimulation  Comfort (Breast/Nipple): Soft / non-tender  Hold (Positioning): Assistance needed to correctly position infant at breast and maintain latch.  LATCH Score: 6  Interventions Interventions: Breast feeding basics reviewed;Support pillows;Assisted with latch;Position options;Skin to skin;Breast compression;Hand express;Breast massage;DEBP;Adjust  position  Lactation Tools Discussed/Used Tools: Shells;Pump;Bottle Shell Type: Inverted Breast pump type: Double-Electric Breast Pump;Manual Pump Review: Setup, frequency, and cleaning;Milk Storage Initiated by:: Reviewed and encouraged every 2-3 hours post BF and follow with hand expression   Consult Status Consult Status: Follow-up Date: 01/08/18 Follow-up type: In-patient    Krista Hogan 01/07/2018, 5:19 PM

## 2018-01-07 NOTE — Progress Notes (Signed)
POSTPARTUM PROGRESS NOTE  POD #1  Subjective:  Krista Hogan is a 37 y.o. G2P1011 s/p CS at [redacted]w[redacted]d.  She reports she doing well. No acute events overnight. She denies any problems with ambulating, voiding or po intake. Denies nausea or vomiting. She has passed flatus. Pain is well controlled.  Lochia has not changed in volume and is as heavy as a period.  Objective: Blood pressure 110/68, pulse 62, temperature 98.2 F (36.8 C), temperature source Oral, resp. rate 18, height 5\' 3"  (1.6 m), weight 107.7 kg, last menstrual period 04/22/2017, SpO2 100 %, unknown if currently breastfeeding.  Physical Exam:  General: alert, cooperative and no distress Chest: no respiratory distress Heart:regular rate, distal pulses intact Abdomen: soft, nontender,  Uterine Fundus: firm, appropriately tender DVT Evaluation: No calf swelling or tenderness Extremities: trace edema Skin: warm, dry; incision clean/dry/intact w/ honeycomb dressing in place  Recent Labs    01/05/18 1032 01/07/18 0556  HGB 11.9* 10.1*  HCT 35.3* 30.0*    Assessment/Plan: Krista Hogan is a 37 y.o. G2P1011 s/p CS at [redacted]w[redacted]d for h/o myomectomy.  POD#1 - Doing welll; pain well controlled on Tylenol. H/H appropriate  Routine postpartum care  OOB, ambulated  Lovenox for VTE prophylaxis Contraception: Oral contraceptive pill Feeding: both breast and bottle  Dispo: Plan for discharge tomorrow.   LOS: 1 day   Ree Kida PA Student 01/07/2018, 7:51 AM

## 2018-01-07 NOTE — Lactation Note (Signed)
This note was copied from a baby's chart. Lactation Consultation Note  Patient Name: Krista Hogan JOACZ'Y Date: 01/07/2018 Reason for consult: Follow-up assessment;Early term 37-38.6wks;1st time breastfeeding;Infant weight loss(mom eating lunch - LC encouraged mom to call when she is down feeding / 3 % weight loss )  3-11p LC aware mom needs to call after finishing lunch.    Maternal Data    Feeding Feeding Type: (per mom last  fed at 1130 , and supplemented 5 ml )  LATCH Score                   Interventions Interventions: Breast feeding basics reviewed  Lactation Tools Discussed/Used     Consult Status Consult Status: Follow-up Date: 01/07/18 Follow-up type: In-patient    [place] 01/07/2018, 3:24 PM

## 2018-01-08 NOTE — Progress Notes (Signed)
Negative pressure drsg continuing to beep. Reinforced drsg and reevaluted box. Box with negative pressure turned off and Dr notified. States will come evaluate drsg.

## 2018-01-08 NOTE — Progress Notes (Signed)
Battery changed. Wound vac not beeping at this time. Dr Juleen China aware. States if wound vac continue to beep after battery change to change to steri strips with honeycomb using sterile technique.

## 2018-01-08 NOTE — Lactation Note (Signed)
This note was copied from a baby's chart. Lactation Consultation Note  Patient Name: Krista Hogan Date: 01/08/2018 Reason for consult: Early term 37-38.6wks;Infant < 6lbs;Follow-up assessment   P1, Baby 44 hours old.  < 6 lbs.  37 weeks. Baby cueing.  Breastfeeding attempted but baby only help nipple in her mouth. Reviewed LPI volume guidelines and suggest mother work toward 20-30 ml. Encouraged mother to continuing post pumping. Mom encouraged to feed baby 8-12 times/24 hours and with feeding cues at least q 3 hours..     Maternal Data    Feeding Feeding Type: Formula Nipple Type: Slow - flow  LATCH Score Latch: Repeated attempts needed to sustain latch, nipple held in mouth throughout feeding, stimulation needed to elicit sucking reflex.  Audible Swallowing: None  Type of Nipple: Everted at rest and after stimulation  Comfort (Breast/Nipple): Soft / non-tender  Hold (Positioning): Assistance needed to correctly position infant at breast and maintain latch.  LATCH Score: 6  Interventions Interventions: Breast feeding basics reviewed;Assisted with latch;Skin to skin;Hand express;DEBP  Lactation Tools Discussed/Used     Consult Status Consult Status: Follow-up Date: 01/09/18 Follow-up type: In-patient    Vivianne Master [facility] 01/08/2018, 1:46 PM

## 2018-01-08 NOTE — Progress Notes (Signed)
Post-Op Day 2, PLTCS for hx myomectomy  Subjective: No complaints, up ad lib, voiding and tolerating PO, passing flatus,small lochia,.plans to bottle feed, oral contraceptives (estrogen/progesterone)  Objective: Blood pressure 104/64, pulse 72, temperature 98.5 F (36.9 C), temperature source Oral, resp. rate 18, height 5\' 3"  (1.6 m), weight 107.7 kg, last menstrual period 04/22/2017, SpO2 100 %, unknown if currently breastfeeding.  Physical Exam:  General: alert, cooperative and no distress Lochia:normal flow Chest: CTAB Heart: RRR no m/r/g Abdomen: +BS, soft, nontender, dsg c/d/intact, no erythema Uterine Fundus: firm DVT Evaluation: No evidence of DVT seen on physical exam. Extremities: no edema  Recent Labs    01/05/18 1032 01/07/18 0556  HGB 11.9* 10.1*  HCT 35.3* 30.0*    Assessment/Plan: Plan for discharge tomorrow   LOS: 2 days   Christin Fudge 01/08/2018, 7:54 AM

## 2018-01-09 MED ORDER — SENNOSIDES-DOCUSATE SODIUM 8.6-50 MG PO TABS: 2.0000 | ORAL_TABLET | ORAL | 0 refills | Status: DC

## 2018-01-09 MED ORDER — OXYCODONE HCL 5 MG PO TABS: 5.0000 mg | ORAL_TABLET | ORAL | 0 refills | Status: DC | PRN

## 2018-01-09 MED ORDER — IBUPROFEN 600 MG PO TABS: 600.0000 mg | ORAL_TABLET | Freq: Four times a day (QID) | ORAL | 1 refills | Status: DC

## 2018-01-09 NOTE — Lactation Note (Signed)
This note was copied from a baby's chart. Lactation Consultation Note  Patient Name: Krista Hogan Date: 01/09/2018 Reason for consult: Early term 37-38.6wks;Follow-up assessment  Baby 16 hours old.  37 weeks. Mother is primarily formula feeding w/ some breastfeeding. Encouraged mother to breastfeed before offering formula to help establish her milk supply. Mom encouraged to feed baby 8-12 times/24 hours and with feeding cues.  Provided formula preparation information per mother's request. Reviewed engorgement care and monitoring voids/stools.    Maternal Data    Feeding Feeding Type: Bottle Fed - Formula  LATCH Score                   Interventions Interventions: Breast feeding basics reviewed  Lactation Tools Discussed/Used     Consult Status Consult Status: Complete Date: 01/09/18    Vivianne Master [facility] 01/09/2018, 10:37 AM

## 2018-01-09 NOTE — Plan of Care (Signed)
  Problem: Education: Goal: Knowledge of condition will improve Outcome: Progressing   Problem: Activity: Goal: Will verbalize the importance of balancing activity with adequate rest periods Outcome: Progressing   Problem: Activity: Goal: Ability to tolerate increased activity will improve Outcome: Progressing   Problem: Skin Integrity: Goal: Demonstration of wound healing without infection will improve Outcome: Progressing

## 2018-01-10 ENCOUNTER — Encounter (HOSPITAL_COMMUNITY): Payer: Self-pay | Admitting: *Deleted

## 2018-01-10 ENCOUNTER — Inpatient Hospital Stay (HOSPITAL_COMMUNITY)
Admission: AD | Admit: 2018-01-10 | Discharge: 2018-01-10 | Disposition: A | Discharge status: Home / Self Care | Payer: Managed Care, Other (non HMO) | Source: Ambulatory Visit | Attending: Obstetrics and Gynecology | Admitting: Obstetrics and Gynecology

## 2018-01-10 DIAGNOSIS — O165 Unspecified maternal hypertension, complicating the puerperium: Secondary | ICD-10-CM | POA: Diagnosis not present

## 2018-01-10 DIAGNOSIS — O1205 Gestational edema, complicating the puerperium: Secondary | ICD-10-CM | POA: Diagnosis present

## 2018-01-10 DIAGNOSIS — Z87891 Personal history of nicotine dependence: Secondary | ICD-10-CM | POA: Diagnosis not present

## 2018-01-10 DIAGNOSIS — Z98891 History of uterine scar from previous surgery: Secondary | ICD-10-CM

## 2018-01-10 HISTORY — DX: Unspecified maternal hypertension, complicating the puerperium: O16.5

## 2018-01-10 LAB — COMPREHENSIVE METABOLIC PANEL
ALT: 20 U/L (ref 0–44)
AST: 26 U/L (ref 15–41)
Albumin: 2.9 g/dL — ABNORMAL LOW (ref 3.5–5.0)
Alkaline Phosphatase: 119 U/L (ref 38–126)
Anion gap: 8 (ref 5–15)
BUN: 17 mg/dL (ref 6–20)
CO2: 25 mmol/L (ref 22–32)
Calcium: 8.9 mg/dL (ref 8.9–10.3)
Chloride: 103 mmol/L (ref 98–111)
Creatinine, Ser: 0.63 mg/dL (ref 0.44–1.00)
GFR calc Af Amer: >60 mL/min (ref >60)
GFR calc non Af Amer: >60 mL/min (ref >60)
Glucose, Bld: 78 mg/dL (ref 70–99)
Potassium: 4.3 mmol/L (ref 3.5–5.1)
Sodium: 136 mmol/L (ref 135–145)
Total Bilirubin: 0.3 mg/dL (ref 0.3–1.2)
Total Protein: 6.1 g/dL — ABNORMAL LOW (ref 6.5–8.1)

## 2018-01-10 LAB — CBC
HCT: 34.3 % — ABNORMAL LOW (ref 36.0–46.0)
Hemoglobin: 11.1 g/dL — ABNORMAL LOW (ref 12.0–15.0)
MCH: 29.8 pg (ref 26.0–34.0)
MCHC: 32.4 g/dL (ref 30.0–36.0)
MCV: 92.2 fL (ref 80.0–100.0)
Platelets: 295 10*3/uL (ref 150–400)
RBC: 3.72 MIL/uL — ABNORMAL LOW (ref 3.87–5.11)
RDW: 14.9 % (ref 11.5–15.5)
WBC: 9.7 10*3/uL (ref 4.0–10.5)
nRBC: 0.2 % (ref 0.0–0.2)

## 2018-01-10 LAB — PROTEIN / CREATININE RATIO, URINE
Creatinine, Urine: 118 mg/dL
Protein Creatinine Ratio: 0.07 mg/mg{Cre} (ref 0.00–0.15)
Total Protein, Urine: 8 mg/dL

## 2018-01-10 MED ORDER — HYDROCHLOROTHIAZIDE 25 MG PO TABS: 25.0000 mg | ORAL_TABLET | Freq: Every day | ORAL | 0 refills | Status: DC

## 2018-01-10 MED ORDER — HYDROCHLOROTHIAZIDE 25 MG PO TABS
25.0000 mg | ORAL_TABLET | Freq: Once | ORAL | Status: AC
  Administered 2018-01-10: 25 mg via ORAL
  Filled 2018-01-10: qty 1

## 2018-01-10 NOTE — Discharge Instructions (Signed)
Someone should call to schedule an appointment for you to be seen on Thursday 01/14/18. If they have not called you to schedule that appointment by Tuesday 01/12/18, call the office.

## 2018-01-10 NOTE — MAU Note (Signed)
Had a c/s on 10/30  Reports increased swelling in both legs, started today, states her legs feel tight and that she could barely wiggle her toes

## 2018-01-10 NOTE — MAU Provider Note (Signed)
History     CSN: [id number]  Arrival date and time: 01/10/18 3086   First Provider Initiated Contact with Patient 01/10/18 1950      Chief Complaint  Patient presents with  . Leg Swelling   HPI  Ms.  Krista Hogan is a 37 y.o. year old G77P1011 female who is 4 days PP, s/p C/S presents to MAU reporting inc swelling in both legs all the way up to hips. She states that her legs are so tight that she could barely wiggle her toes. She denies any H/A, dizziness or blurry vision. She did not have elevated BPs in pregnancy or labor. She had a C/S d/t previous myomectomy. She is currently breastfeeding and supplementing with formula.  *The patient's mother reports that the patient was up trying to cook, clean and other things yesterday.  Past Medical History:  Diagnosis Date  . DUB (dysfunctional uterine bleeding)   . Graves disease 06/2016  . Graves disease   . History of abnormal cervical Pap smear    2011  . History of HPV infection   . Uterine fibroid   . Wears glasses     Past Surgical History:  Procedure Laterality Date  . CESAREAN SECTION N/A 01/06/2018   Procedure: CESAREAN SECTION;  Surgeon: Caren Macadam, MD;  Location: [place];  Service: Obstetrics;  Laterality: N/A;  . EXCISION OF SKIN TAG  11/22/2014   Procedure: EXCISION OF SKIN TAG VULVA;  Surgeon: Governor Specking, MD;  Location: [place];  Service: Gynecology;;  . LAPAROSCOPIC GELPORT ASSISTED MYOMECTOMY N/A 11/22/2014   Procedure: LAPAROSCOPIC GELPORT ASSISTED MYOMECTOMY WITH CHROMOTUBATION;  Surgeon: Governor Specking, MD;  Location: [place];  Service: Gynecology;  Laterality: N/A;  . LAPAROSCOPY N/A 11/22/2014   Procedure: LAPAROSCOPY DIAGNOSTIC;  Surgeon: Governor Specking, MD;  Location: [place];  Service: Gynecology;  Laterality: N/A;  . LEEP  2011  . TONSILLECTOMY AND ADENOIDECTOMY  age 6  . WISDOM TOOTH EXTRACTION  2006    Family  History  Problem Relation Age of Onset  . Fibroids Mother   . Thyroid disease Neg Hx     Social History   Tobacco Use  . Smoking status: Former Smoker    Packs/day: 0.25    Years: 10.00    Pack years: 2.50    Types: Cigarettes    Last attempt to quit: 04/2017    Years since quitting: 0.7  . Smokeless tobacco: Never Used  Substance Use Topics  . Alcohol use: Yes    Alcohol/week: 0.0 standard drinks    Comment: SOCIAL  . Drug use: No    Allergies: No Known Allergies  Medications Prior to Admission  Medication Sig Dispense Refill Last Dose  . ACCU-CHEK FASTCLIX LANCETS MISC 1 Stick by Percutaneous route 4 (four) times daily. 100 each 12 Taking  . acetaminophen (TYLENOL) 500 MG tablet Take 500 mg by mouth every 6 (six) hours as needed for moderate pain.   Taking  . Blood Glucose Monitoring Suppl (ACCU-CHEK AVIVA PLUS) w/Device KIT 1 Device by Percutaneous route 4 (four) times a week. 1 kit 0 Taking  . cetirizine (ZYRTEC) 10 MG tablet Take 10 mg by mouth daily.   Taking  . cholecalciferol (VITAMIN D) 1000 units tablet Take 1,000 Units by mouth daily.   Taking  . cyanocobalamin 1000 MCG tablet Take 1,000 mcg by mouth daily.   Taking  . Elastic Bandages & Supports (COMFORT FIT MATERNITY SUPP SM) MISC  Wear as directed. 1 each 0 Taking  . glucose blood (ACCU-CHEK GUIDE) test strip Use to check blood sugars four times a day was instructed 50 each 12 Taking  . ibuprofen (ADVIL,MOTRIN) 600 MG tablet Take 1 tablet (600 mg total) by mouth every 6 (six) hours. 60 tablet 1   . Omega-3 Fatty Acids (FISH OIL) 1000 MG CAPS Take 1,000 mg by mouth daily.    Taking  . ondansetron (ZOFRAN ODT) 8 MG disintegrating tablet Take 1 tablet (8 mg total) by mouth every 8 (eight) hours as needed for nausea or vomiting. 20 tablet 0 Taking  . oxyCODONE (OXY IR/ROXICODONE) 5 MG immediate release tablet Take 1 tablet (5 mg total) by mouth every 4 (four) hours as needed (pain scale 4-7). 30 tablet 0   . Prenat  w/o A-FeCb-FeGl-DSS-FA (CITRANATAL RX) 27-1 MG TABS Take 1 tablet by mouth daily before breakfast. 90 each 4 Taking  . senna-docusate (SENOKOT-S) 8.6-50 MG tablet Take 2 tablets by mouth daily. 20 tablet 0     Review of Systems  Constitutional: Negative.   HENT: Negative.   Eyes: Negative.   Respiratory: Negative.   Cardiovascular: Positive for leg swelling ("all the way up to hips").  Gastrointestinal: Negative.   Endocrine: Negative.   Genitourinary: Negative.   Musculoskeletal: Negative.   Allergic/Immunologic: Negative.   Neurological: Negative.   Hematological: Negative.   Psychiatric/Behavioral: Negative.    Physical Exam   Patient Vitals for the past 24 hrs:  BP Temp Pulse Resp SpO2 Weight  01/10/18 2031 (!) 142/95 - 76 - - -  01/10/18 2024 (!) 143/89 - 63 - - -  01/10/18 2001 139/88 - 69 - - -  01/10/18 1946 138/87 - (!) 58 - - -  01/10/18 1931 (!) 144/98 - 72 - - -  01/10/18 1922 133/85 - 73 - - -  01/10/18 1855 (!) 157/99 98.3 F (36.8 C) 81 20 100 % 108.5 kg    Physical Exam  Nursing note and vitals reviewed. Constitutional: She is oriented to person, place, and time. She appears well-developed and well-nourished.  HENT:  Head: Normocephalic and atraumatic.  Eyes: Pupils are equal, round, and reactive to light.  Neck: Normal range of motion.  Cardiovascular: Normal rate.  Respiratory: Effort normal.  GI: Soft.  Genitourinary:  Genitourinary Comments: Pelvic not indicated  Musculoskeletal: She exhibits edema.       Right hip: She exhibits swelling.       Left hip: She exhibits swelling.       Right knee: She exhibits decreased range of motion and swelling.       Left knee: She exhibits decreased range of motion and swelling.       Right ankle: She exhibits decreased range of motion and swelling.       Left ankle: She exhibits decreased range of motion and swelling.  2-3+ pitting edema in BLE / Decreased ROM d/t amount of swelling / Hands swollen  bilaterally  Neurological: She is alert and oriented to person, place, and time. She has normal reflexes.  Skin: Skin is warm and dry.  Psychiatric: She has a normal mood and affect. Her behavior is normal. Judgment and thought content normal.    MAU Course  Procedures  MDM CBC CMP P/C Ratio Serial BPs HCTZ 25 mg po  *Consult with Dr. Rip Harbour @ 2109 - notified of patient's complaints, assessments, lab results, tx plan d/c home with Rx for HCTZ 25 mg daily x 5 days, F/U  in office for incision and BP check - ok to d/c home, agrees with plan, but increase days of HCTZ to 7 days  Results for orders placed or performed during the hospital encounter of 01/10/18 (from the past 24 hour(s))  Protein / creatinine ratio, urine     Status: None   Collection Time: 01/10/18  7:40 PM  Result Value Ref Range   Creatinine, Urine 118.00 mg/dL   Total Protein, Urine 8 mg/dL   Protein Creatinine Ratio 0.07 0.00 - 0.15 mg/mg[Cre]  CBC     Status: Abnormal   Collection Time: 01/10/18  8:30 PM  Result Value Ref Range   WBC 9.7 4.0 - 10.5 K/uL   RBC 3.72 (L) 3.87 - 5.11 MIL/uL   Hemoglobin 11.1 (L) 12.0 - 15.0 g/dL   HCT 34.3 (L) 36.0 - 46.0 %   MCV 92.2 80.0 - 100.0 fL   MCH 29.8 26.0 - 34.0 pg   MCHC 32.4 30.0 - 36.0 g/dL   RDW 14.9 11.5 - 15.5 %   Platelets 295 150 - 400 K/uL   nRBC 0.2 0.0 - 0.2 %  Comprehensive metabolic panel     Status: Abnormal   Collection Time: 01/10/18  8:30 PM  Result Value Ref Range   Sodium 136 135 - 145 mmol/L   Potassium 4.3 3.5 - 5.1 mmol/L   Chloride 103 98 - 111 mmol/L   CO2 25 22 - 32 mmol/L   Glucose, Bld 78 70 - 99 mg/dL   BUN 17 6 - 20 mg/dL   Creatinine, Ser 0.63 0.44 - 1.00 mg/dL   Calcium 8.9 8.9 - 10.3 mg/dL   Total Protein 6.1 (L) 6.5 - 8.1 g/dL   Albumin 2.9 (L) 3.5 - 5.0 g/dL   AST 26 15 - 41 U/L   ALT 20 0 - 44 U/L   Alkaline Phosphatase 119 38 - 126 U/L   Total Bilirubin 0.3 0.3 - 1.2 mg/dL   GFR calc non Af Amer >60 >60 mL/min   GFR calc  Af Amer >60 >60 mL/min   Anion gap 8 5 - 15    Assessment and Plan  Postpartum hypertension  - Go to [facility] Thursday 01/14/18 for BP and swelling recheck - Rx for HCTZ 25 mg daily x 6 days (1st dose given in MAU) - Advised to increased H2O to prevent breast milk supply depletion - Information provided on PP HTN   S/P primary low transverse C-section - Incision covered with [facility] dressing; which is C/D/I - Go to [facility] Thursday 01/14/18 for incision check  - Discharge patient - Someone from [facility] will call with an appt time for 01/14/18 -- msg sent to [facility] - Patient verbalized an understanding of the plan of care and agrees.    Laury Deep, MSN, CNM 01/10/2018, 8:08 PM

## 2018-01-13 ENCOUNTER — Inpatient Hospital Stay (HOSPITAL_COMMUNITY)
Admission: AD | Admit: 2018-01-13 | Discharge: 2018-01-13 | Disposition: A | Discharge status: Home / Self Care | Payer: Managed Care, Other (non HMO) | Source: Ambulatory Visit | Attending: Obstetrics and Gynecology | Admitting: Obstetrics and Gynecology

## 2018-01-13 ENCOUNTER — Encounter (HOSPITAL_COMMUNITY): Payer: Self-pay | Admitting: *Deleted

## 2018-01-13 DIAGNOSIS — O900 Disruption of cesarean delivery wound: Secondary | ICD-10-CM | POA: Diagnosis not present

## 2018-01-13 DIAGNOSIS — O165 Unspecified maternal hypertension, complicating the puerperium: Secondary | ICD-10-CM | POA: Diagnosis not present

## 2018-01-13 DIAGNOSIS — K5900 Constipation, unspecified: Secondary | ICD-10-CM | POA: Insufficient documentation

## 2018-01-13 DIAGNOSIS — O9089 Other complications of the puerperium, not elsewhere classified: Secondary | ICD-10-CM | POA: Diagnosis not present

## 2018-01-13 DIAGNOSIS — O901 Disruption of perineal obstetric wound: Secondary | ICD-10-CM | POA: Diagnosis not present

## 2018-01-13 DIAGNOSIS — Z87891 Personal history of nicotine dependence: Secondary | ICD-10-CM | POA: Insufficient documentation

## 2018-01-13 LAB — URINALYSIS, ROUTINE W REFLEX MICROSCOPIC
Bacteria, UA: NONE SEEN
Bilirubin Urine: NEGATIVE
Glucose, UA: NEGATIVE mg/dL
KETONES UR: NEGATIVE mg/dL
Nitrite: NEGATIVE
PROTEIN: NEGATIVE mg/dL
Specific Gravity, Urine: 1.005 (ref 1.005–1.030)
pH: 9 — ABNORMAL HIGH (ref 5.0–8.0)

## 2018-01-13 MED ORDER — IBUPROFEN 800 MG PO TABS
800.0000 mg | ORAL_TABLET | Freq: Once | ORAL | Status: AC
  Administered 2018-01-13: 800 mg via ORAL
  Filled 2018-01-13: qty 1

## 2018-01-13 MED ORDER — SULFAMETHOXAZOLE-TRIMETHOPRIM 800-160 MG PO TABS: 1.0000 | ORAL_TABLET | Freq: Two times a day (BID) | ORAL | 0 refills | Status: AC

## 2018-01-13 MED ORDER — METOCLOPRAMIDE HCL 10 MG PO TABS: 10.0000 mg | ORAL_TABLET | Freq: Once | ORAL | Status: DC

## 2018-01-13 NOTE — MAU Note (Signed)
Pt states she had BM in hospital two days after C/S.

## 2018-01-13 NOTE — MAU Note (Signed)
Pt used a suppository this morning and has not had a productive BM. Has not had a BM since delivery, taking stool softener. Pain is 10/10 in her rectum.

## 2018-01-13 NOTE — MAU Provider Note (Signed)
Chief Complaint: Constipation   First Provider Initiated Contact with Patient 01/13/18 1026     SUBJECTIVE HPI: Krista Hogan is a 37 y.o. G2P1011 at 1 weeks s/p cesarean who presents to [facility]missions reporting constipation. Had a BM prior to being discharged from the hospital but no normal BM since then. She says some small hard balls have come out. Has been taking stool softeners daily & used a suppository this morning without relief. Has been taking percocet for pain.  Denies n/v, fever/chills. Reports some lower abdominal pain near incision but most pain is rectal.   Seen in MAU the other day for swelling & HTN. Discharged home with HCTZ & has f/u in office for BP check later this week. Denies headache, visual disturbance, or epigastric pain. Reports taking her meds as prescribed.   Location: rectum Quality: pressure Severity: 10/10 on pain scale Duration: 2 days Timing: constant Modifying factors: worse to sit down Associated signs and symptoms: constipation  Past Medical History:  Diagnosis Date  . DUB (dysfunctional uterine bleeding)   . Graves disease 06/2016  . History of abnormal cervical Pap smear    2011  . History of HPV infection   . Uterine fibroid   . Wears glasses    OB History  Gravida Para Term Preterm AB Living  '2 1 1 ' 0 1 1  SAB TAB Ectopic Multiple Live Births  1 0 0 0 1    # Outcome Date GA Lbr Len/2nd Weight Sex Delivery Anes PTL Lv  2 Term 01/06/18 [redacted]w[redacted]d 2760 g F CS-LTranv Spinal  LIV  1 SAB 04/22/17     SAB      Past Surgical History:  Procedure Laterality Date  . CESAREAN SECTION N/A 01/06/2018   Procedure: CESAREAN SECTION;  Surgeon: NCaren Macadam MD;  [place] [facility]  Service: Obstetrics;  Laterality: N/A;  . EXCISION OF SKIN TAG  11/22/2014   Procedure: EXCISION OF SKIN TAG VULVA;  Surgeon: TGovernor Specking MD;  [facility] [facility]  Service: Gynecology;;  . LAPAROSCOPIC GELPORT ASSISTED  MYOMECTOMY N/A 11/22/2014   Procedure: LAPAROSCOPIC GELPORT ASSISTED MYOMECTOMY WITH CHROMOTUBATION;  Surgeon: TGovernor Specking MD;  [facility]  Service: Gynecology;  Laterality: N/A;  . LAPAROSCOPY N/A 11/22/2014   Procedure: LAPAROSCOPY DIAGNOSTIC;  Surgeon: TGovernor Specking MD;  [facility] [facility]  Service: Gynecology;  Laterality: N/A;  . LEEP  2011  . TONSILLECTOMY AND ADENOIDECTOMY  age 89 . WISDOM TOOTH EXTRACTION  2006   Social History   Socioeconomic History  . Marital status: Single    Spouse name: Not on file  . Number of children: Not on file  . Years of education: Not on file  . Highest education level: Not on file  Occupational History  . Not on file  Social Needs  . Financial resource strain: Not hard at all  . Food insecurity:    Worry: Never true    Inability: Never true  . Transportation needs:    Medical: No    Non-medical: Not on file  Tobacco Use  . Smoking status: Former Smoker    Packs/day: 0.25    Years: 10.00    Pack years: 2.50    Types: Cigarettes    Last attempt to quit: 04/2017    Years since quitting: 0.7  . Smokeless tobacco: Never Used  Substance and Sexual Activity  . Alcohol use: Yes    Alcohol/week: 0.0 standard drinks  Comment: SOCIAL  . Drug use: No  . Sexual activity: Yes    Birth control/protection: None  Lifestyle  . Physical activity:    Days per week: Not on file    Minutes per session: Not on file  . Stress: To some extent  Relationships  . Social connections:    Talks on phone: Not on file    Gets together: Not on file    Attends religious service: Not on file    Active member of club or organization: Not on file    Attends meetings of clubs or organizations: Not on file    Relationship status: Not on file  . Intimate partner violence:    Fear of current or ex partner: No    Emotionally abused: No    Physically abused: No    Forced sexual activity: No  Other Topics  Concern  . Not on file  Social History Narrative  . Not on file   Family History  Problem Relation Age of Onset  . Fibroids Mother   . Thyroid disease Neg Hx    No current facility-administered medications on file prior to encounter.    Current Outpatient Medications on File Prior to Encounter  Medication Sig Dispense Refill  . ACCU-CHEK FASTCLIX LANCETS MISC 1 Stick by Percutaneous route 4 (four) times daily. 100 each 12  . acetaminophen (TYLENOL) 500 MG tablet Take 500 mg by mouth every 6 (six) hours as needed for moderate pain.    . Blood Glucose Monitoring Suppl (ACCU-CHEK AVIVA PLUS) w/Device KIT 1 Device by Percutaneous route 4 (four) times a week. 1 kit 0  . cetirizine (ZYRTEC) 10 MG tablet Take 10 mg by mouth daily.    . cholecalciferol (VITAMIN D) 1000 units tablet Take 1,000 Units by mouth daily.    . cyanocobalamin 1000 MCG tablet Take 1,000 mcg by mouth daily.    Marland Kitchen glucose blood (ACCU-CHEK GUIDE) test strip Use to check blood sugars four times a day was instructed 50 each 12  . hydrochlorothiazide (HYDRODIURIL) 25 MG tablet Take 1 tablet (25 mg total) by mouth daily. 6 tablet 0  . ibuprofen (ADVIL,MOTRIN) 600 MG tablet Take 1 tablet (600 mg total) by mouth every 6 (six) hours. 60 tablet 1  . Omega-3 Fatty Acids (FISH OIL) 1000 MG CAPS Take 1,000 mg by mouth daily.     . Prenat w/o A-FeCb-FeGl-DSS-FA (CITRANATAL RX) 27-1 MG TABS Take 1 tablet by mouth daily before breakfast. 90 each 4  . senna-docusate (SENOKOT-S) 8.6-50 MG tablet Take 2 tablets by mouth daily. 20 tablet 0   No Known Allergies  I have reviewed patient's Past Medical Hx, Surgical Hx, Family Hx, Social Hx, medications and allergies.   Review of Systems  Constitutional: Negative.   Gastrointestinal: Positive for abdominal pain, constipation and rectal pain. Negative for blood in stool, diarrhea, nausea and vomiting.  Genitourinary: Negative.     OBJECTIVE Patient Vitals for the past 24 hrs:  BP Temp  Temp src Pulse Resp Weight  01/13/18 1410 (!) 151/81 - - 66 - -  01/13/18 1351 (!) 150/93 - - 86 18 -  01/13/18 0916 (!) 125/93 98.3 F (36.8 C) Oral 90 20 102.5 kg   Constitutional: Well-developed, well-nourished female in no acute distress.  Cardiovascular: normal rate & rhythm, no murmur Respiratory: normal rate and effort. Lung sounds clear throughout GI: ~2 cm area mid incision that is opened, fascia intact. Abd soft, non-tender, Pos BS x 4. No guarding or rebound  tenderness MS: Extremities nontender, no edema, normal ROM Neurologic: Alert and oriented x 4.      LAB RESULTS Results for orders placed or performed during the hospital encounter of 01/13/18 (from the past 24 hour(s))  Urinalysis, Routine w reflex microscopic     Status: Abnormal   Collection Time: 01/13/18  9:18 AM  Result Value Ref Range   Color, Urine STRAW (A) YELLOW   APPearance CLEAR CLEAR   Specific Gravity, Urine 1.005 1.005 - 1.030   pH 9.0 (H) 5.0 - 8.0   Glucose, UA NEGATIVE NEGATIVE mg/dL   Hgb urine dipstick LARGE (A) NEGATIVE   Bilirubin Urine NEGATIVE NEGATIVE   Ketones, ur NEGATIVE NEGATIVE mg/dL   Protein, ur NEGATIVE NEGATIVE mg/dL   Nitrite NEGATIVE NEGATIVE   Leukocytes, UA TRACE (A) NEGATIVE   RBC / HPF 21-50 0 - 5 RBC/hpf   WBC, UA 0-5 0 - 5 WBC/hpf   Bacteria, UA NONE SEEN NONE SEEN   Squamous Epithelial / LPF 0-5 0 - 5  Aerobic Culture (superficial specimen) w precautions panel     Status: None (Preliminary result)   Collection Time: 01/13/18  1:20 PM  Result Value Ref Range   Specimen Description      ABDOMEN Performed at [facility], [address], [place], [place] [address]    Special Requests      Normal Performed at [facility], [address] [place], [place] [address]    Gram Stain      RARE WBC PRESENT,BOTH PMN AND MONONUCLEAR NO ORGANISMS SEEN Performed at [facility], [place] [address] [place], [place] [address]    Culture PENDING    Report Status  PENDING     IMAGING No results found.  MAU COURSE Orders Placed This Encounter  Procedures  . Aerobic Culture (superficial specimen) w precautions panel  . Urinalysis, Routine w reflex microscopic  . Discharge patient  . Discharge patient   Meds ordered this encounter  Medications  . DISCONTD: metoCLOPramide (REGLAN) tablet 10 mg  . ibuprofen (ADVIL,MOTRIN) tablet 800 mg  . sulfamethoxazole-trimethoprim (BACTRIM DS,SEPTRA DS) 800-160 MG tablet    Sig: Take 1 tablet by mouth 2 (two) times daily for 7 days.    Dispense:  14 tablet    Refill:  0    Order Specific Question:   Supervising Provider    Answer:   Sloan Leiter [[id number]]    MDM Pt given soap suds enema with large results & reports marked improvement in discomfort. Able to perform better exam of incision now that patient is more comfortable. Dr. Rosana Hoes called to bedside to evaluate incision. Fascia intact. Will pack. Wound culture collected.   Incision packed with iodoform dressing. Rx Bactrim. Discussed care of wound at home. Msg sent to [facility] for BP & wound check with provider on Friday.   Discussed tx of constipation at home. D/c percocet.   ASSESSMENT 1. Constipation, unspecified constipation type   2. Postpartum wound disruption   3. Postpartum hypertension     PLAN Discharge home in stable condition. Infection & hypertension precautions  Follow-up Information    [facility] Follow up.   Why:  Office will call you for follow up appointment on Friday Contact information: [place] [phone]         Allergies as of 01/13/2018   No Known Allergies     Medication List    STOP taking these medications  COMFORT FIT MATERNITY SUPP SM Misc   ondansetron 8 MG disintegrating tablet Commonly known as:  ZOFRAN-ODT   oxyCODONE 5 MG immediate release tablet Commonly known as:  Oxy IR/ROXICODONE     TAKE these medications    ACCU-CHEK AVIVA PLUS w/Device Kit 1 Device by Percutaneous route 4 (four) times a week.   ACCU-CHEK FASTCLIX LANCETS Misc 1 Stick by Percutaneous route 4 (four) times daily.   acetaminophen 500 MG tablet Commonly known as:  TYLENOL Take 500 mg by mouth every 6 (six) hours as needed for moderate pain.   cetirizine 10 MG tablet Commonly known as:  ZYRTEC Take 10 mg by mouth daily.   cholecalciferol 1000 units tablet Commonly known as:  VITAMIN D Take 1,000 Units by mouth daily.   CITRANATAL RX 27-1 MG Tabs Take 1 tablet by mouth daily before breakfast.   cyanocobalamin 1000 MCG tablet Take 1,000 mcg by mouth daily.   Fish Oil 1000 MG Caps Take 1,000 mg by mouth daily.   glucose blood test strip Use to check blood sugars four times a day was instructed   hydrochlorothiazide 25 MG tablet Commonly known as:  HYDRODIURIL Take 1 tablet (25 mg total) by mouth daily.   ibuprofen 600 MG tablet Commonly known as:  ADVIL,MOTRIN Take 1 tablet (600 mg total) by mouth every 6 (six) hours.   senna-docusate 8.6-50 MG tablet Commonly known as:  Senokot-S Take 2 tablets by mouth daily.   sulfamethoxazole-trimethoprim 800-160 MG tablet Commonly known as:  BACTRIM DS,SEPTRA DS Take 1 tablet by mouth 2 (two) times daily for 7 days.        Jorje Guild, NP 01/13/2018  6:58 PM

## 2018-01-13 NOTE — Discharge Instructions (Signed)
You have constipation which is hard stools that are difficult to pass. It is important to have regular bowel movements every 1-3 days that are soft and easy to pass. Hard stools increase your risk of hemorrhoids and are very uncomfortable.   To prevent constipation you can increase the amount of fiber in your diet. Examples of foods with fiber are leafy greens, whole grain breads, oatmeal and other grains.  It is also important to drink at least eight 8oz glass of water everyday.   If you have not has a bowel movement in 4-5 days you made need to clean out your bowel.  This will have establish normal movement through your bowel.    Miralax Clean out  Take 8 capfuls of miralax in 64 oz of gatorade. You can use any fluid that appeals to you (gatorade, water, juice)  Continue to drink at least eight 8 oz glasses of water throughout the day  You can repeat with another 8 capfuls of miralax in 64 oz of gatorade if you are not having a large amount of stools  You will need to be at home and close to a bathroom for about 8 hours when you do the above as you may need to go to the bathroom frequently.   After you are cleaned out: - Start Colace100mg  twice daily - Start Miralax once daily - Start a daily fiber supplement like metamucil or citrucel - You can safely use enemas in pregnancy  - if you are having diarrhea you can reduce to Colace once a day or miralax every other day or a 1/2 capful daily.      Wound Packing Wound packing involves placing a moistened packing material into your wound and then covering it with an outer bandage (dressing). This helps promote proper healing of deep tissue and tissue under the skin. It also helps prevent bleeding, infection, and further injury. Wounds are packed until deep tissue has had time to heal. The time it takes for this to occur is different for everyone. Your health care provider will show you how to pack and dress your wound. Using gloves and a  sterile technique is important in order to avoid spreading germs into your wound. What are the risks?  Infection.  Delayed or abnormal healing. How to pack your wound Follow your health care provider's instructions on how often you need to change dressings and pack your wound. You will likely be asked to change dressings 1-2 times a day. Supplies Needed  Gloves.  Wetting solution.  Clean bowl.  Packing material (gauze or gauze sponges).  Clean towels.  Outer dressing.  Tape.  Cotton balls or cotton-tipped swabs.  Small plastic bag. Preparing Your New Packing Material  1. Make sure your work surface or countertop is clean and disinfected. 2. Wash your hands well with soap and water. 3. Put a clean towel on the counter. 4. Put a clean bowl on the towel. Be sure to only touch the outside of the bowl when handling it. 5. Pour wetting solution into the bowl. 6. Cut your packing material (gauze or sponges) to the right size for your wound. Drop it into the bowl. 7. Cut four tape strips that you will use to seal the outer dressing. 8. Put cotton balls or cotton-tipped swabs on the clean towel. Removing the Old Packing and Dressing 1. Gently remove the old dressing and packing material. 2. Put the removed items into the plastic bag to throw away later. 3.  Wash your hands well with soap and water again. Applying New Packing Material and Dressing 1. Put on your gloves. 2. Squeeze the packing material in the bowl to release excess liquid. The packing material should be moist, but not dripping wet. 3. Gently place the packing material into the wound. Use a cotton ball or cotton-tipped swab to guide it into place, filling all of the space. 4. Dry your fingertips on the towel. 5. Open up your outer dressing supplies and put them on a dry part of the towel. Keep them from getting wet. 6. Place the dressing over the packed wound. 7. Tape the four outer edges of the dressing in  place. 8. Remove your gloves. 9. Wash your hands again with soap and water. General Tips  Follow your health care provider's instructions on how tightly to pack the wound. At first, the wound will be packed tightly to help stop bleeding. As the wound begins to heal inside, you will use less packing material and pack the wound loosely to allow tissue to heal slowly from the inside out.  Keep the dressing clean and dry.  Follow any other instructions given by your health care provider on how to aid healing. This may include applying warm or cold compresses, elevating the affected area, or wearing a compression dressing.  Ask your health care provider about sun exposure and sunscreen when the dressings are no longer needed.  Keep all follow-up visits with your health care provider. This is important. Contact a health care provider if:  You have drainage, redness, swelling, or pain at your wound site.  You notice a bad smell coming from the wound site.  Your pain is not controlled with pain medicine.  Tissue inside your wound changes color from pink to white, yellow, or black.  Your wound changes in size or depth.  You have a fever.  You have shaking chills.  You are having trouble packing your wound. This information is not intended to replace advice given to you by your health care provider. Make sure you discuss any questions you have with your health care provider. Document Released: 09/21/2013 Document Revised: 09/14/2015 Document Reviewed: 04/20/2013 Elsevier Interactive Patient Education  [employer].

## 2018-01-14 ENCOUNTER — Ambulatory Visit (INDEPENDENT_AMBULATORY_CARE_PROVIDER_SITE_OTHER): Payer: Managed Care, Other (non HMO) | Admitting: Obstetrics

## 2018-01-14 ENCOUNTER — Encounter: Payer: Self-pay | Admitting: Obstetrics

## 2018-01-14 VITALS — BP 149/97 | HR 69 | Temp 98.8°F | Ht 63.0 in | Wt 221.4 lb

## 2018-01-14 DIAGNOSIS — G8918 Other acute postprocedural pain: Secondary | ICD-10-CM

## 2018-01-14 DIAGNOSIS — Z9889 Other specified postprocedural states: Secondary | ICD-10-CM

## 2018-01-14 DIAGNOSIS — O8600 Infection of obstetric surgical wound, unspecified: Secondary | ICD-10-CM

## 2018-01-14 MED ORDER — TRAMADOL HCL 50 MG PO TABS: 50.0000 mg | ORAL_TABLET | Freq: Four times a day (QID) | ORAL | 0 refills | Status: DC | PRN

## 2018-01-14 NOTE — Progress Notes (Signed)
Pt presents for follow up for wound check. She denies having any headache, blurred vision, or swelling. Pt has not started antibiotic yet. She states that she will pick this up today. She is currently doing the ibuprofen for pain with no relief.

## 2018-01-14 NOTE — Progress Notes (Signed)
Patient ID: Krista Hogan, female   DOB: 09/29/1980, 37 y.o.   MRN: [id number]  Chief Complaint  Patient presents with  . Wound Check    HPI Krista Hogan is a 37 y.o. female.  Presents for post op C/S wound infection management.  She has a small area of wound separation with iodoform gauze strip packing in place.  HPI  Past Medical History:  Diagnosis Date  . DUB (dysfunctional uterine bleeding)   . Graves disease 06/2016  . History of abnormal cervical Pap smear    2011  . History of HPV infection   . Uterine fibroid   . Wears glasses     Past Surgical History:  Procedure Laterality Date  . CESAREAN SECTION N/A 01/06/2018   Procedure: CESAREAN SECTION;  Surgeon: Caren Macadam, MD;  Location: [facility];  Service: Obstetrics;  Laterality: N/A;  . EXCISION OF SKIN TAG  11/22/2014   Procedure: EXCISION OF SKIN TAG VULVA;  Surgeon: Governor Specking, MD;  Location: [facility];  Service: Gynecology;;  . LAPAROSCOPIC GELPORT ASSISTED MYOMECTOMY N/A 11/22/2014   Procedure: LAPAROSCOPIC GELPORT ASSISTED MYOMECTOMY WITH CHROMOTUBATION;  Surgeon: Governor Specking, MD;  Location: [facility];  Service: Gynecology;  Laterality: N/A;  . LAPAROSCOPY N/A 11/22/2014   Procedure: LAPAROSCOPY DIAGNOSTIC;  Surgeon: Governor Specking, MD;  Location: [facility];  Service: Gynecology;  Laterality: N/A;  . LEEP  2011  . TONSILLECTOMY AND ADENOIDECTOMY  age 81  . WISDOM TOOTH EXTRACTION  2006    Family History  Problem Relation Age of Onset  . Fibroids Mother   . Thyroid disease Neg Hx     Social History Social History   Tobacco Use  . Smoking status: Former Smoker    Packs/day: 0.25    Years: 10.00    Pack years: 2.50    Types: Cigarettes    Last attempt to quit: 04/2017    Years since quitting: 0.7  . Smokeless tobacco: Never Used  Substance Use Topics  . Alcohol use: Yes    Alcohol/week: 0.0 standard drinks    Comment:  SOCIAL  . Drug use: No    No Known Allergies  Current Outpatient Medications  Medication Sig Dispense Refill  . acetaminophen (TYLENOL) 500 MG tablet Take 500 mg by mouth every 6 (six) hours as needed for moderate pain.    . cetirizine (ZYRTEC) 10 MG tablet Take 10 mg by mouth daily.    . cholecalciferol (VITAMIN D) 1000 units tablet Take 1,000 Units by mouth daily.    . cyanocobalamin 1000 MCG tablet Take 1,000 mcg by mouth daily.    Marland Kitchen glucose blood (ACCU-CHEK GUIDE) test strip Use to check blood sugars four times a day was instructed 50 each 12  . hydrochlorothiazide (HYDRODIURIL) 25 MG tablet Take 1 tablet (25 mg total) by mouth daily. 6 tablet 0  . ibuprofen (ADVIL,MOTRIN) 600 MG tablet Take 1 tablet (600 mg total) by mouth every 6 (six) hours. 60 tablet 1  . Omega-3 Fatty Acids (FISH OIL) 1000 MG CAPS Take 1,000 mg by mouth daily.     . Prenat w/o A-FeCb-FeGl-DSS-FA (CITRANATAL RX) 27-1 MG TABS Take 1 tablet by mouth daily before breakfast. 90 each 4  . senna-docusate (SENOKOT-S) 8.6-50 MG tablet Take 2 tablets by mouth daily. 20 tablet 0  . sulfamethoxazole-trimethoprim (BACTRIM DS,SEPTRA DS) 800-160 MG tablet Take 1 tablet by mouth 2 (two) times daily for 7 days. 14 tablet 0  .  ACCU-CHEK FASTCLIX LANCETS MISC 1 Stick by Percutaneous route 4 (four) times daily. (Patient not taking: Reported on 01/14/2018) 100 each 12  . Blood Glucose Monitoring Suppl (ACCU-CHEK AVIVA PLUS) w/Device KIT 1 Device by Percutaneous route 4 (four) times a week. (Patient not taking: Reported on 01/14/2018) 1 kit 0  . traMADol (ULTRAM) 50 MG tablet Take 1 tablet (50 mg total) by mouth every 6 (six) hours as needed. 30 tablet 0   No current facility-administered medications for this visit.     Review of Systems Review of Systems Constitutional: negative for fatigue and weight loss Respiratory: negative for cough and wheezing Cardiovascular: negative for chest pain, fatigue and palpitations Gastrointestinal:  negative for abdominal pain and change in bowel habits Genitourinary:positive for C/S wound infection Integument/breast: negative for nipple discharge Musculoskeletal:negative for myalgias Neurological: negative for gait problems and tremors Behavioral/Psych: negative for abusive relationship, depression Endocrine: negative for temperature intolerance      Blood pressure (!) 149/97, pulse 69, temperature 98.8 F (37.1 C), temperature source Oral, height _0  (1.6 m), weight 221 lb 6.4 oz (100.4 kg), unknown if currently breastfeeding.  Physical Exam Physical Exam General:   alert and no distress  Skin:    Dressing Change:  Left corner of C/S incision inspected and was clean. Iodoform gauze strip removed, and a saline moistened plain gauze strip packed; and a bandage was applied.  Patient tolerated procedure well.  50% of 20 min visit spent on counseling and coordination of care.   Data Reviewed Wound Culture  Assessment     1. Cesarean wound infection Rx: - Ambulatory referral to [facility]  2. Postoperative pain Rx: - traMADol (ULTRAM) 50 MG tablet; Take 1 tablet (50 mg total) by mouth every 6 (six) hours as needed.  Dispense: 30 tablet; Refill: 0 - continue Ibuprofen along with Tramadol  - discontinue Oxycodone because of constipation   Plan    Continue daily Wet to Dry dressing changes Follow up in the office in 2 weeks  Orders Placed This Encounter  Procedures  . Ambulatory referral to Home Health    Referral Priority:   Routine    Referral Type:   Home Health Care    Referral Reason:   Specialty Services Required    Requested Specialty:   [place]    Number of Visits Requested:   1   Meds ordered this encounter  Medications  . traMADol (ULTRAM) 50 MG tablet    Sig: Take 1 tablet (50 mg total) by mouth every 6 (six) hours as needed.    Dispense:  30 tablet    Refill:  0    Shelly Bombard MD 01-14-2018

## 2018-01-15 ENCOUNTER — Ambulatory Visit: Payer: Managed Care, Other (non HMO) | Admitting: Obstetrics

## 2018-01-15 LAB — AEROBIC CULTURE W GRAM STAIN (SUPERFICIAL SPECIMEN)

## 2018-01-15 LAB — AEROBIC CULTURE  (SUPERFICIAL SPECIMEN): SPECIAL REQUESTS: NORMAL

## 2018-01-18 ENCOUNTER — Other Ambulatory Visit: Payer: Self-pay

## 2018-01-18 MED ORDER — HYDROCHLOROTHIAZIDE 25 MG PO TABS: 25.0000 mg | ORAL_TABLET | Freq: Every day | ORAL | 0 refills | Status: DC

## 2018-01-18 NOTE — Telephone Encounter (Signed)
Pt aware rx approved.

## 2018-01-18 NOTE — Telephone Encounter (Signed)
Received call from home care nurse from [facility] requesting rf on pt's BP meds.

## 2018-01-18 NOTE — Telephone Encounter (Signed)
HCTZ Rx

## 2018-01-19 ENCOUNTER — Other Ambulatory Visit: Payer: Self-pay | Admitting: Obstetrics

## 2018-01-20 ENCOUNTER — Encounter: Payer: Self-pay | Admitting: Obstetrics

## 2018-01-20 ENCOUNTER — Ambulatory Visit (INDEPENDENT_AMBULATORY_CARE_PROVIDER_SITE_OTHER): Payer: Managed Care, Other (non HMO) | Admitting: Obstetrics

## 2018-01-20 ENCOUNTER — Other Ambulatory Visit: Payer: Self-pay

## 2018-01-20 VITALS — BP 141/85 | HR 83 | Ht 60.0 in | Wt 209.3 lb

## 2018-01-20 DIAGNOSIS — T8149XA Infection following a procedure, other surgical site, initial encounter: Secondary | ICD-10-CM

## 2018-01-20 DIAGNOSIS — Z9889 Other specified postprocedural states: Secondary | ICD-10-CM

## 2018-01-20 MED ORDER — CIPROFLOXACIN HCL 500 MG PO TABS: 500.0000 mg | ORAL_TABLET | Freq: Two times a day (BID) | ORAL | 0 refills | Status: DC

## 2018-01-20 NOTE — Progress Notes (Signed)
Subjective:     Krista Hogan is a 37 y.o. female who presents for a postpartum visit. She is 2 weeks postpartum following a low cervical transverse Cesarean section. I have fully reviewed the prenatal and intrapartum course. The delivery was at 46 gestational weeks. Outcome: primary cesarean section, low transverse incision. Anesthesia: spinal. Postpartum course has been complicated by a wound infection. Baby's course has been normal. Baby is feeding by breast. Bleeding thin lochia. Bowel function is normal. Bladder function is normal. Patient is not sexually active. Contraception method is abstinence. Postpartum depression screening: negative.  Tobacco, alcohol and substance abuse history reviewed.  Adult immunizations reviewed including TDAP, rubella and varicella.  The following portions of the patient's history were reviewed and updated as appropriate: allergies, current medications, past family history, past medical history, past social history, past surgical history and problem list.  Review of Systems A comprehensive review of systems was negative.   Objective:    BP (!) 141/85   Pulse 83   Ht 5' (1.524 m)   Wt 209 lb 4.8 oz (94.9 kg)   Breastfeeding? Yes   BMI 40.88 kg/m   General:  alert and no distress   Breasts:  inspection negative, no nipple discharge or bleeding, no masses or nodularity palpable  Lungs: clear to auscultation bilaterally  Heart:  regular rate and rhythm, S1, S2 normal, no murmur, click, rub or gallop  Abdomen: abnormal findings:  wound inspected, packing removed and wound repacked.  The wound is closing appropriately and is clean and non tender.  Bandage applied.    50% of 20 min visit spent on counseling and coordination of care.   Assessment:     1. Wound infection after surgery Rx: - ciprofloxacin (CIPRO) 500 MG tablet; Take 1 tablet (500 mg total) by mouth 2 (two) times daily.  Dispense: 20 tablet; Refill: 0    Plan:    1. Contraception:  considering options 2. Continue daily wound packing 3. Follow up in: 2 weeks or as needed.    Shelly Bombard MD 01-20-2018

## 2018-01-20 NOTE — Progress Notes (Signed)
Presents for incision check, 15 days PP.  C/o odor, open area from middle to right side of incision, drainage, pain.  Denies fever, NV, chills.  Has not taken Bp Rx in 2 days and said that she has being taking the Antibiotics incorrectly.

## 2018-01-28 ENCOUNTER — Ambulatory Visit: Payer: Managed Care, Other (non HMO) | Admitting: Obstetrics

## 2018-02-01 ENCOUNTER — Other Ambulatory Visit: Payer: Self-pay | Admitting: Obstetrics

## 2018-02-01 ENCOUNTER — Telehealth: Payer: Self-pay

## 2018-02-01 DIAGNOSIS — G8918 Other acute postprocedural pain: Secondary | ICD-10-CM

## 2018-02-01 NOTE — Telephone Encounter (Signed)
Patient would like to know if she can have a refill of her tramadol.

## 2018-02-02 NOTE — Telephone Encounter (Signed)
Left mychart message for pt letting her know she should be taking ibuprofen at this point per dr. Jodi Mourning

## 2018-02-02 NOTE — Telephone Encounter (Signed)
Patient should take Ibuprofen at this point.

## 2018-02-03 ENCOUNTER — Ambulatory Visit: Payer: Managed Care, Other (non HMO) | Admitting: Obstetrics

## 2018-02-11 ENCOUNTER — Encounter: Payer: Self-pay | Admitting: Obstetrics

## 2018-02-11 ENCOUNTER — Ambulatory Visit (INDEPENDENT_AMBULATORY_CARE_PROVIDER_SITE_OTHER): Payer: Managed Care, Other (non HMO) | Admitting: Obstetrics

## 2018-02-11 DIAGNOSIS — O901 Disruption of perineal obstetric wound: Secondary | ICD-10-CM

## 2018-02-11 DIAGNOSIS — O165 Unspecified maternal hypertension, complicating the puerperium: Secondary | ICD-10-CM

## 2018-02-11 DIAGNOSIS — Z1389 Encounter for screening for other disorder: Secondary | ICD-10-CM

## 2018-02-11 DIAGNOSIS — Z30011 Encounter for initial prescription of contraceptive pills: Secondary | ICD-10-CM

## 2018-02-11 DIAGNOSIS — Z3009 Encounter for other general counseling and advice on contraception: Secondary | ICD-10-CM

## 2018-02-11 MED ORDER — NORETHINDRONE 0.35 MG PO TABS: 1.0000 | ORAL_TABLET | Freq: Every day | ORAL | 11 refills | Status: DC

## 2018-02-11 NOTE — Progress Notes (Signed)
Post Partum Exam  Krista Hogan is a 37 y.o. G7P1011 female who presents for a postpartum visit. She is 5 weeks postpartum following a low cervical transverse Cesarean section. I have fully reviewed the prenatal and intrapartum course. The delivery was at 33 gestational weeks.  Anesthesia: spinal. Postpartum course has been well. Baby's course has been well. Baby is feeding by bottle - Similac Advance. Bleeding red. Bowel function is normal. Bladder function is normal. Patient is sexually active. Contraception method is none. Postpartum depression screening:neg  The following portions of the patient's history were reviewed and updated as appropriate: allergies, current medications, past family history, past medical history, past social history, past surgical history and problem list. Last pap smear done 02/23/17 and was Normal  Review of Systems A comprehensive review of systems was negative.    Objective:  currently breastfeeding.  General:  alert and no distress   Breasts:  inspection negative, no nipple discharge or bleeding, no masses or nodularity palpable  Lungs: clear to auscultation bilaterally  Heart:  regular rate and rhythm, S1, S2 normal, no murmur, click, rub or gallop  Abdomen: soft, non-tender; bowel sounds normal; no masses,  no organomegaly                                Incision is clean, dry and intact Assessment:   1. Postpartum care following cesarean delivery - doing well  2. Postpartum wound disruption - resolved  3. Postpartum hypertension - stable BP's  4. Encounter for other general counseling and advice on contraception - wants OCP's  5. Encounter for initial prescription of contraceptive pills.  Breast Feeding. Rx: - norethindrone (MICRONOR,CAMILA,ERRIN) 0.35 MG tablet; Take 1 tablet (0.35 mg total) by mouth daily.  Dispense: 1 Package; Refill: 11  Plan:   1. Contraception: oral progesterone-only contraceptive 2. Continue PNV's 3. Follow up in: 3  weeks or as needed.    Shelly Bombard MD 02-11-2018

## 2018-02-12 IMAGING — US US OB < 14 WEEKS - US OB TV
1 series · 15 of 28 positions shown · non-contrast
Comparison: None for this gestation

CLINICAL DATA: Abdominal pain and vaginal bleeding in pregnancy;
quantitative beta HCG = 151

EXAM:
OBSTETRIC <14 WK US AND TRANSVAGINAL OB US
TECHNIQUE: Both transabdominal and transvaginal ultrasound examinations were
performed for complete evaluation of the gestation as well as the
maternal uterus, adnexal regions, and pelvic cul-de-sac.
Transvaginal technique was performed to assess early pregnancy.

[Series 1: us ob < 14 weeks - us ob tv · 15 of 103 slices shown]
[im 1/103]
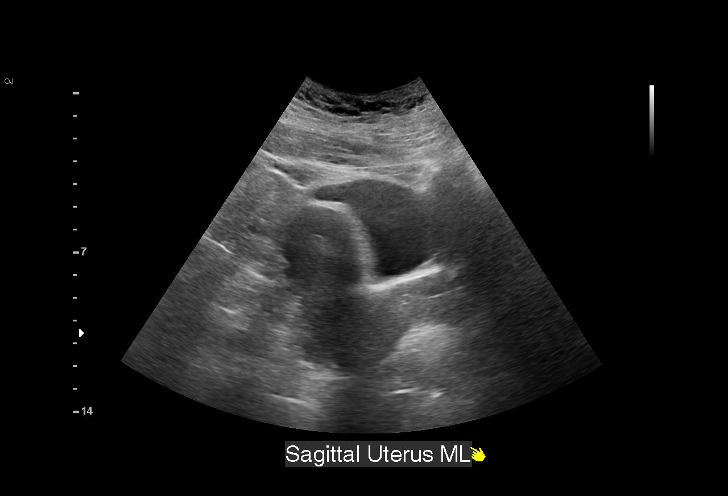
[im 8/103]
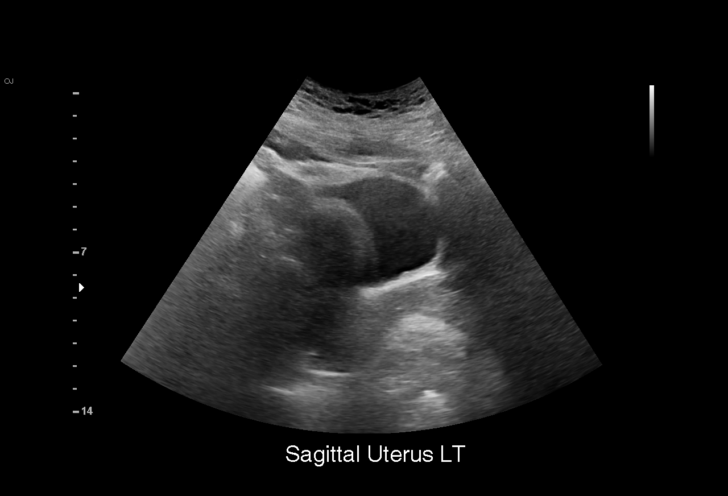
[im 16/103]
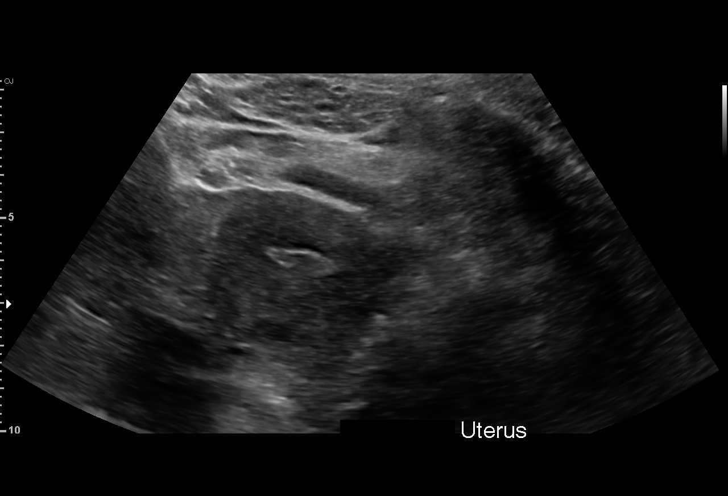
[im 23/103]
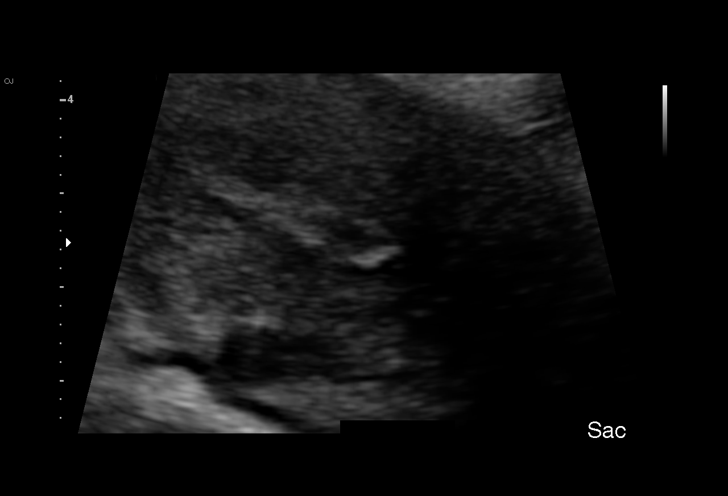
[im 31/103]
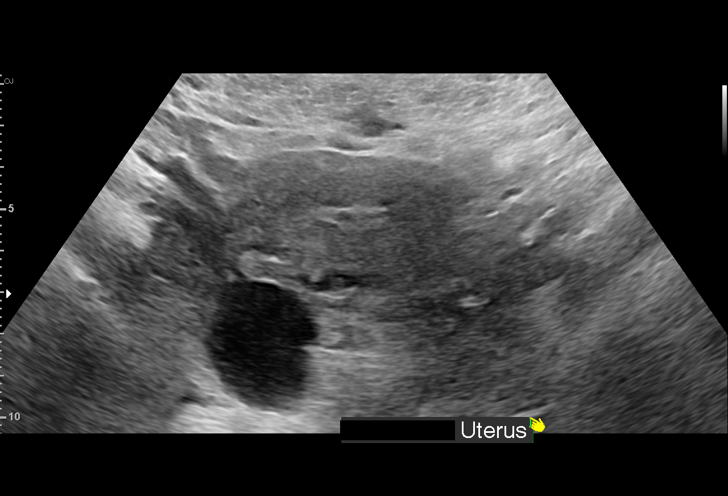
[im 38/103]
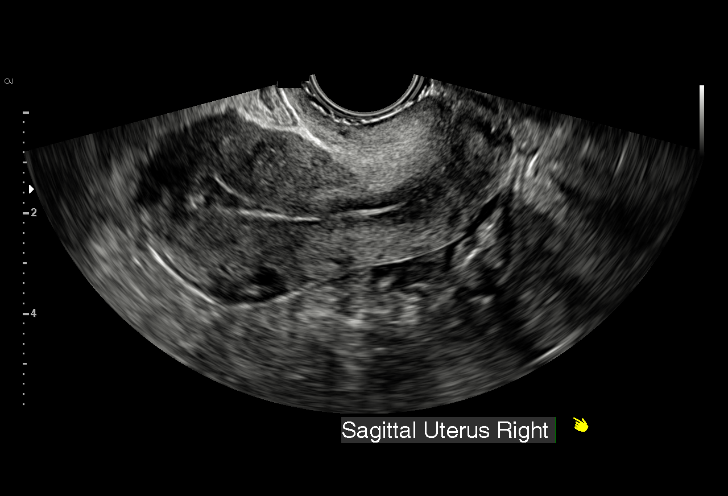
[im 46/103]
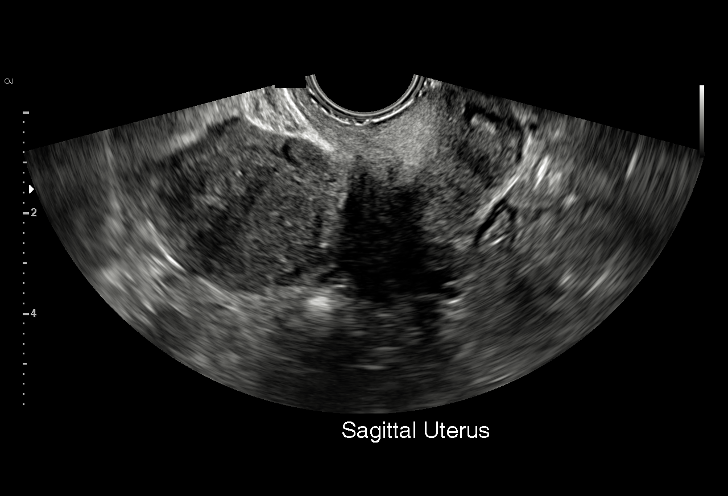
[im 53/103]
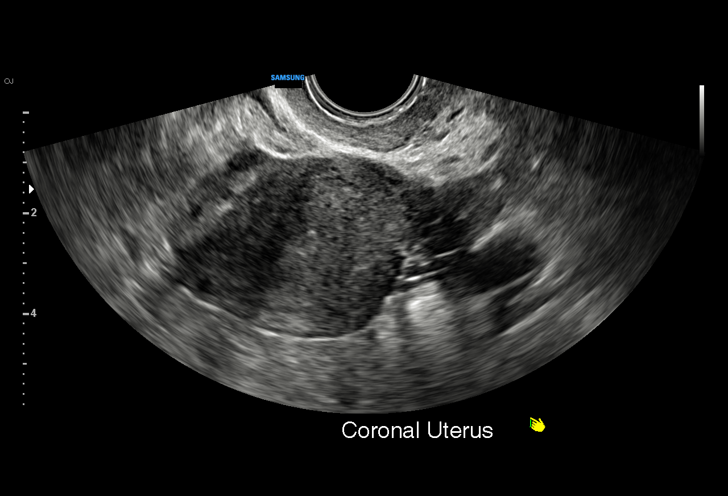
[im 57/103]
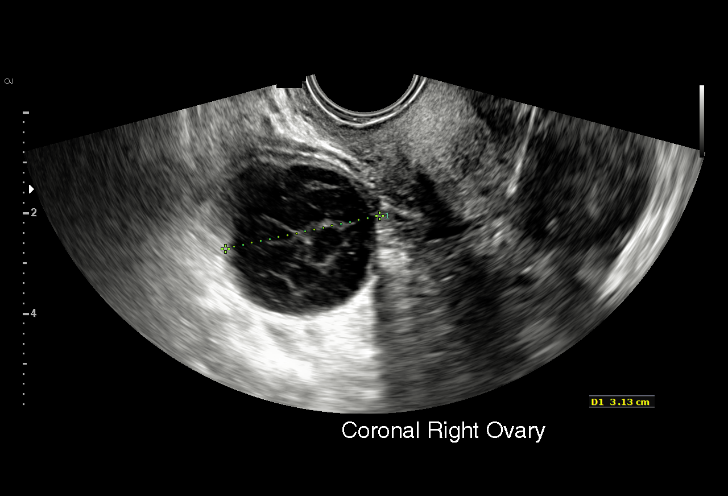
[im 65/103]
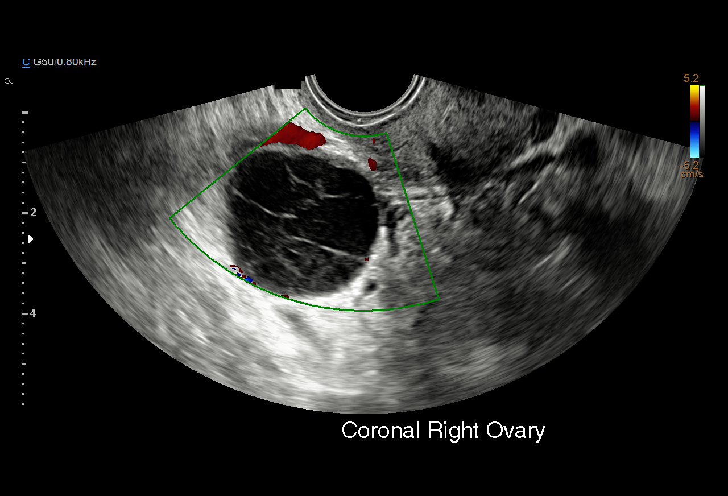
[im 72/103]
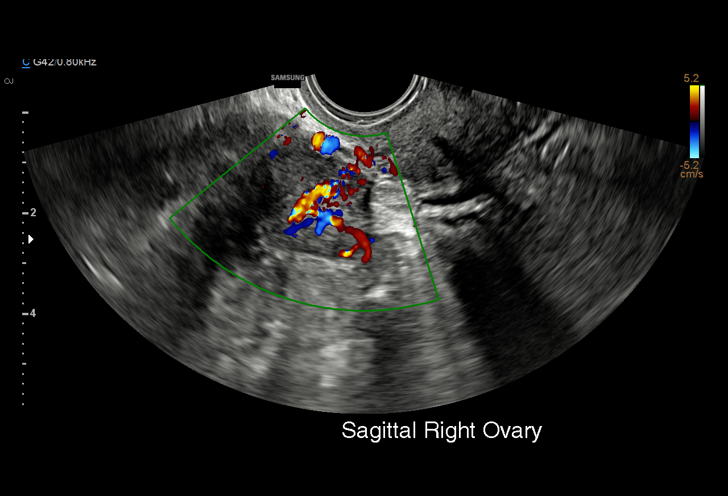
[im 80/103]
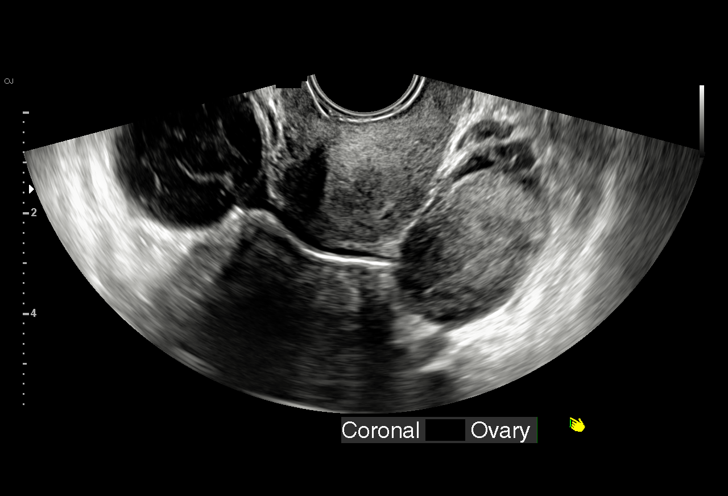
[im 87/103]
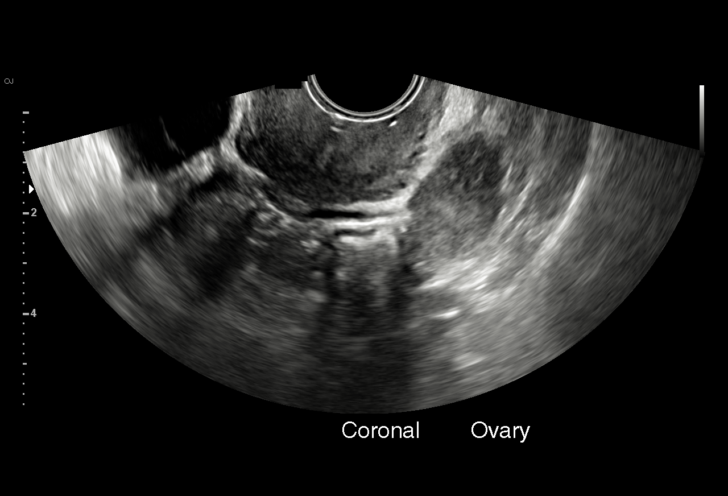
[im 95/103]
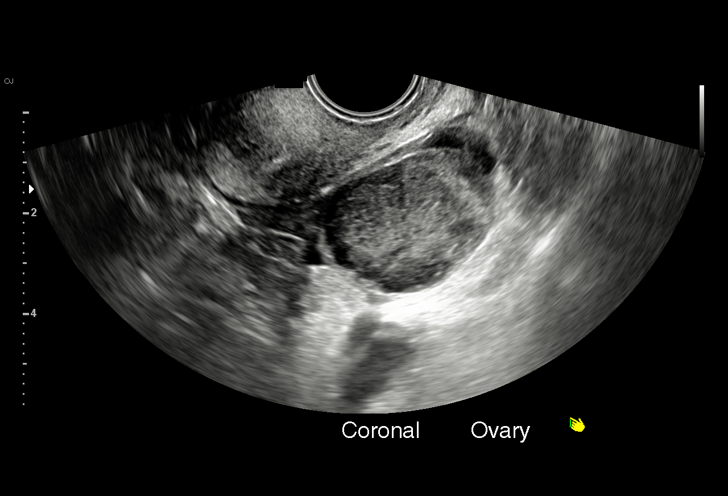
[im 103/103]
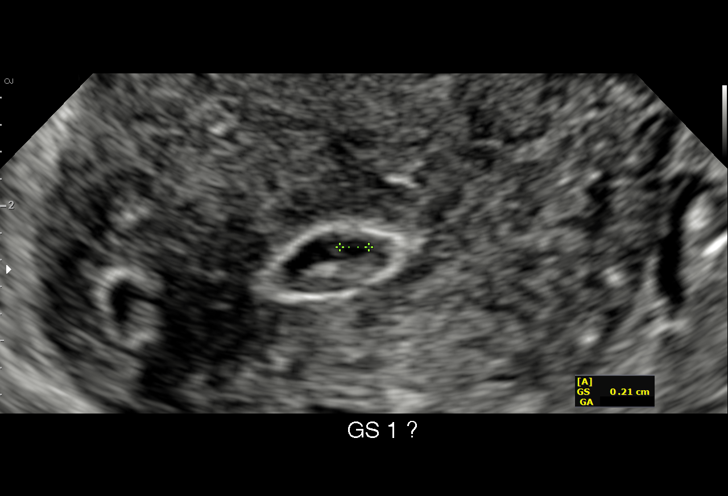

[15 of 28 positions shown; findings below may reference images not displayed]

FINDINGS: Intrauterine gestational sac: Questionably visualized

Yolk sac:  N/A

Embryo:  N/A

Cardiac Activity: N/A

Heart Rate: N/A  bpm

Subchorionic hemorrhage:  N/A

Maternal uterus/adnexae: Small amount of questionable fluid is seen
within the endometrial canal at the upper uterine segment, uncertain
if represents endometrial fluid or early gestational sac. Minimal
decidual reaction. No focal uterine mass is otherwise visualized.
The RIGHT ovary contains a multi-septated predominately cystic
lesion 3.0 x 3.2 x 2.8 cm question hemorrhagic cyst. LEFT ovary
contains a heterogeneous slightly hypoechoic nodular region without
internal blood flow, question hemorrhagic corpus luteum, less likely
tumor. No other adnexal masses or free pelvic fluid.
IMPRESSION: Questionable visualization of a gestational sac within the
endometrial canal versus a small amount of complex endometrial
fluid.

Recommend followup sonography in 14 days to reassess.

Probable hemorrhagic cyst of the RIGHT ovary with a questionable
hemorrhagic corpus luteum versus less likely tumor at the LEFT
ovary; these can also be assessed at time of follow-up imaging.

## 2018-02-16 ENCOUNTER — Encounter: Payer: Self-pay | Admitting: *Deleted

## 2018-02-26 ENCOUNTER — Encounter: Payer: Self-pay | Admitting: Internal Medicine

## 2018-03-02 ENCOUNTER — Other Ambulatory Visit: Payer: Self-pay | Admitting: Obstetrics

## 2018-03-04 ENCOUNTER — Encounter: Payer: Self-pay | Admitting: Obstetrics

## 2018-03-04 ENCOUNTER — Ambulatory Visit (INDEPENDENT_AMBULATORY_CARE_PROVIDER_SITE_OTHER): Payer: Managed Care, Other (non HMO) | Admitting: Obstetrics

## 2018-03-04 DIAGNOSIS — O165 Unspecified maternal hypertension, complicating the puerperium: Secondary | ICD-10-CM

## 2018-03-04 DIAGNOSIS — Z3009 Encounter for other general counseling and advice on contraception: Secondary | ICD-10-CM

## 2018-03-04 NOTE — Progress Notes (Signed)
RGYN here to F/U as last note directed from 02/11/18. Pt was given Rx for  Birth Control pills.  Pt states she has not started them yet.  CC: NONE  LMP: pt not sure of date pt states cycle was normal.

## 2018-03-04 NOTE — Progress Notes (Signed)
Subjective:     Krista Hogan is a 37 y.o. female who presents for a postpartum visit. She is 8 weeks postpartum following a low cervical transverse Cesarean section. I have fully reviewed the prenatal and intrapartum course. The delivery was at 45 gestational weeks. Outcome: primary cesarean section, low transverse incision. Anesthesia: spinal. Postpartum course has been normal. Baby's course has been normal. Baby is feeding by both breast and bottle - Similac Advance. Bleeding no bleeding. Bowel function is normal. Bladder function is normal. Patient is not sexually active. Contraception method is abstinence. Postpartum depression screening: negative.  Tobacco, alcohol and substance abuse history reviewed.  Adult immunizations reviewed including TDAP, rubella and varicella.  The following portions of the patient's history were reviewed and updated as appropriate: allergies, current medications, past family history, past medical history, past social history, past surgical history and problem list.  Review of Systems A comprehensive review of systems was negative.   Objective:    BP 116/83   Pulse 75   Wt 216 lb (98 kg)   Breastfeeding No   BMI 42.18 kg/m   General:  alert and no distress   Breasts:  inspection negative, no nipple discharge or bleeding, no masses or nodularity palpable  Lungs: clear to auscultation bilaterally  Heart:  regular rate and rhythm, S1, S2 normal, no murmur, click, rub or gallop  Abdomen: soft, non-tender; bowel sounds normal; no masses,  no organomegaly and incision is clean, dry and intact    50% of 15 min visit spent on counseling and coordination of care.   Assessment:   1. Postpartum care following cesarean delivery - doing well  2. Postpartum hypertension - stable BP's  3. Lactating mother - having some difficulty with milk production  4. Encounter for other general counseling and advice on contraception - starting Progestin-only pill this  coming weekend  Plan:    1. Contraception: oral progesterone-only contraceptive 2. Continue PNV's 3. Follow up in: 6 weeks or as needed.   Healthy lifestyle practices reviewed   Shelly Bombard MD 03-04-2018

## 2018-03-11 ENCOUNTER — Telehealth: Payer: Self-pay

## 2018-03-11 NOTE — Telephone Encounter (Signed)
Pt called requesting letter to be faxed to her job stating that she can return to work. Pt has been cleared to return to at her PP visit. Pt would like letter faxed to [phone] Attn: Kerry Hough. Letter faxed

## 2018-04-15 ENCOUNTER — Ambulatory Visit: Payer: Managed Care, Other (non HMO) | Admitting: Obstetrics

## 2018-04-20 ENCOUNTER — Telehealth: Payer: Self-pay | Admitting: *Deleted

## 2018-04-20 ENCOUNTER — Encounter: Payer: Self-pay | Admitting: Internal Medicine

## 2018-04-20 ENCOUNTER — Other Ambulatory Visit: Payer: Self-pay | Admitting: Obstetrics

## 2018-04-20 ENCOUNTER — Telehealth: Payer: Self-pay | Admitting: Obstetrics

## 2018-04-20 NOTE — Telephone Encounter (Signed)
Pt called to office for refill on her Boric Acid Rx.   Please send refill if approved.

## 2018-04-20 NOTE — Telephone Encounter (Signed)
Telephone call to patient to discuss behavior in the office today.  Patient was not in, left message for her to call.  Patient phoned the office this morning requesting a refill.  The refill was not a standard protocol medication, thus request was forwarded to Dr. Jodi Mourning for approval.  Patient subsequently called numerous times throughout the day regarding this request.  Patient then presented to the office and was asked to wait as her provider was with other patients.  The patient then let herself in to the clinical area and walked directly to Dr. Jacelyn Grip office.    This behavior cannot be accepted to enter the clinical area without being escorted by the clinical staff and to go directly to a provider to obtain a prescription.  Our policy is to allow 24 hours for medication refills.

## 2018-04-27 ENCOUNTER — Ambulatory Visit (INDEPENDENT_AMBULATORY_CARE_PROVIDER_SITE_OTHER): Payer: Managed Care, Other (non HMO) | Admitting: Obstetrics

## 2018-04-27 ENCOUNTER — Encounter: Payer: Self-pay | Admitting: Obstetrics

## 2018-04-27 DIAGNOSIS — A630 Anogenital (venereal) warts: Secondary | ICD-10-CM

## 2018-04-27 NOTE — Progress Notes (Signed)
Patient ID: Krista Hogan, female   DOB: [birth year], 38 y.o.   MRN: [id number]  Chief Complaint  Patient presents with  . Follow-up    Postpartum care    HPI Krista Hogan is a 38 y.o. female.  ~ 3 months postpartum.  Has history of genital warts.  Presents today for first treatment with TCA 80%. HPI  Past Medical History:  Diagnosis Date  . DUB (dysfunctional uterine bleeding)   . Graves disease 06/2016  . History of abnormal cervical Pap smear    2011  . History of HPV infection   . Uterine fibroid   . Wears glasses     Past Surgical History:  Procedure Laterality Date  . CESAREAN SECTION N/A 01/06/2018   Procedure: CESAREAN SECTION;  Surgeon: Caren Macadam, MD;  Location: [facility];  Service: Obstetrics;  Laterality: N/A;  . EXCISION OF SKIN TAG  11/22/2014   Procedure: EXCISION OF SKIN TAG VULVA;  Surgeon: Governor Specking, MD;  Location: [place];  Service: Gynecology;;  . LAPAROSCOPIC GELPORT ASSISTED MYOMECTOMY N/A 11/22/2014   Procedure: LAPAROSCOPIC GELPORT ASSISTED MYOMECTOMY WITH CHROMOTUBATION;  Surgeon: Governor Specking, MD;  Location: [facility];  Service: Gynecology;  Laterality: N/A;  . LAPAROSCOPY N/A 11/22/2014   Procedure: LAPAROSCOPY DIAGNOSTIC;  Surgeon: Governor Specking, MD;  Location: [facility];  Service: Gynecology;  Laterality: N/A;  . LEEP  2011  . TONSILLECTOMY AND ADENOIDECTOMY  age 40  . WISDOM TOOTH EXTRACTION  2006    Family History  Problem Relation Age of Onset  . Fibroids Mother   . Thyroid disease Neg Hx     Social History Social History   Tobacco Use  . Smoking status: Former Smoker    Packs/day: 0.25    Years: 10.00    Pack years: 2.50    Types: Cigarettes    Last attempt to quit: 04/2017    Years since quitting: 1.0  . Smokeless tobacco: Never Used  Substance Use Topics  . Alcohol use: Yes    Alcohol/week: 0.0 standard drinks    Comment: SOCIAL  . Drug  use: No    No Known Allergies  Current Outpatient Medications  Medication Sig Dispense Refill  . acetaminophen (TYLENOL) 500 MG tablet Take 500 mg by mouth every 6 (six) hours as needed for moderate pain.    . cetirizine (ZYRTEC) 10 MG tablet Take 10 mg by mouth daily.    . cholecalciferol (VITAMIN D) 1000 units tablet Take 1,000 Units by mouth daily.    . cyanocobalamin 1000 MCG tablet Take 1,000 mcg by mouth daily.    Marland Kitchen ibuprofen (ADVIL,MOTRIN) 600 MG tablet Take 1 tablet (600 mg total) by mouth every 6 (six) hours. 60 tablet 1  . norethindrone (MICRONOR,CAMILA,ERRIN) 0.35 MG tablet Take 1 tablet (0.35 mg total) by mouth daily. 1 Package 11  . Omega-3 Fatty Acids (FISH OIL) 1000 MG CAPS Take 1,000 mg by mouth daily.     . Prenat w/o A-FeCb-FeGl-DSS-FA (CITRANATAL RX) 27-1 MG TABS Take 1 tablet by mouth daily before breakfast. 90 each 4  . senna-docusate (SENOKOT-S) 8.6-50 MG tablet Take 2 tablets by mouth daily. 20 tablet 0  . hydrochlorothiazide (HYDRODIURIL) 25 MG tablet Take 1 tablet (25 mg total) by mouth daily. 30 tablet 0   No current facility-administered medications for this visit.     Review of Systems Review of Systems Constitutional: negative for fatigue and weight loss Respiratory: negative for cough and  wheezing Cardiovascular: negative for chest pain, fatigue and palpitations Gastrointestinal: negative for abdominal pain and change in bowel habits Genitourinary:POSITIVE for genital warts Integument/breast: negative for nipple discharge Musculoskeletal:negative for myalgias Neurological: negative for gait problems and tremors Behavioral/Psych: negative for abusive relationship, depression Endocrine: negative for temperature intolerance      Blood pressure 133/89, pulse 71, resp. rate 16, height 5\' 3"  (1.6 m), weight 211 lb 8 oz (95.9 kg), last menstrual period 03/21/2018, not currently breastfeeding.  Physical Exam Physical Exam  External Genitalia:      TCA 80%  applied to 4 small genital warts   50% of 15 min visit spent on counseling and coordination of care.   Data Reviewed Labs  Assessment     1. Postpartum care following cesarean delivery - doing well  2. Genital warts - TCA 80% applied    Plan    Follow up in 2 weeks  No orders of the defined types were placed in this encounter.  No orders of the defined types were placed in this encounter.    Shelly Bombard MD 04-27-2018

## 2018-04-27 NOTE — Progress Notes (Signed)
Patient is here for postpartum care follow up and to begin treatment for HPV.

## 2018-05-07 ENCOUNTER — Telehealth: Payer: Self-pay | Admitting: Internal Medicine

## 2018-05-07 ENCOUNTER — Ambulatory Visit: Payer: Managed Care, Other (non HMO) | Admitting: Internal Medicine

## 2018-05-07 NOTE — Telephone Encounter (Signed)
Patient no showed today's appt. Please advise on how to follow up. °A. No follow up necessary. °B. Follow up urgent. Contact patient immediately. °C. Follow up necessary. Contact patient and schedule visit in ___ days. °D. Follow up advised. Contact patient and schedule visit in ____weeks. ° °Would you like the NS fee to be applied to this visit? ° °

## 2018-05-07 NOTE — Telephone Encounter (Signed)
3-6 mo

## 2018-05-11 ENCOUNTER — Ambulatory Visit: Payer: Managed Care, Other (non HMO) | Admitting: Obstetrics

## 2018-05-25 ENCOUNTER — Ambulatory Visit: Payer: Managed Care, Other (non HMO) | Admitting: Obstetrics

## 2018-05-31 ENCOUNTER — Ambulatory Visit (INDEPENDENT_AMBULATORY_CARE_PROVIDER_SITE_OTHER): Payer: Managed Care, Other (non HMO) | Admitting: Obstetrics

## 2018-05-31 ENCOUNTER — Other Ambulatory Visit: Payer: Self-pay

## 2018-05-31 ENCOUNTER — Encounter: Payer: Self-pay | Admitting: Obstetrics

## 2018-05-31 DIAGNOSIS — Z30011 Encounter for initial prescription of contraceptive pills: Secondary | ICD-10-CM | POA: Diagnosis not present

## 2018-05-31 DIAGNOSIS — N939 Abnormal uterine and vaginal bleeding, unspecified: Secondary | ICD-10-CM | POA: Diagnosis not present

## 2018-05-31 DIAGNOSIS — Z3009 Encounter for other general counseling and advice on contraception: Secondary | ICD-10-CM | POA: Diagnosis not present

## 2018-05-31 MED ORDER — NORETHINDRONE-ETH ESTRADIOL 1-35 MG-MCG PO TABS: 1.0000 | ORAL_TABLET | Freq: Every day | ORAL | 11 refills | Status: DC

## 2018-05-31 NOTE — Addendum Note (Signed)
Addended by: Shelly Bombard on: 05/31/2018 03:55 PM   Modules accepted: Orders

## 2018-05-31 NOTE — Progress Notes (Signed)
TELEHEALTH VIRTUAL GYNECOLOGY VISIT ENCOUNTER NOTE  I connected with Krista Hogan on 05/31/18 at  3:00 PM EDT by telephone and verified that I am speaking with the correct person using two identifiers.   I discussed the limitations, risks, security and privacy concerns of performing an evaluation and management service by telephone and the availability of in person appointments. I also discussed with the patient that there may be a patient responsible charge related to this service. The patient expressed understanding and agreed to proceed.   History:  Krista Hogan is a 38 y.o. G58P1011 female being evaluated today for BTB on Micronor.  She is not breast feeding.  She denies any abnormal vaginal discharge, pelvic pain or other concerns.       Past Medical History:  Diagnosis Date  . DUB (dysfunctional uterine bleeding)   . Graves disease 06/2016  . History of abnormal cervical Pap smear    2011  . History of HPV infection   . Uterine fibroid   . Wears glasses    Past Surgical History:  Procedure Laterality Date  . CESAREAN SECTION N/A 01/06/2018   Procedure: CESAREAN SECTION;  Surgeon: Caren Macadam, MD;  Location: [facility];  Service: Obstetrics;  Laterality: N/A;  . EXCISION OF SKIN TAG  11/22/2014   Procedure: EXCISION OF SKIN TAG VULVA;  Surgeon: Governor Specking, MD;  Location: [facility];  Service: Gynecology;;  . LAPAROSCOPIC GELPORT ASSISTED MYOMECTOMY N/A 11/22/2014   Procedure: LAPAROSCOPIC GELPORT ASSISTED MYOMECTOMY WITH CHROMOTUBATION;  Surgeon: Governor Specking, MD;  Location: [facility];  Service: Gynecology;  Laterality: N/A;  . LAPAROSCOPY N/A 11/22/2014   Procedure: LAPAROSCOPY DIAGNOSTIC;  Surgeon: Governor Specking, MD;  Location: [facility];  Service: Gynecology;  Laterality: N/A;  . LEEP  2011  . TONSILLECTOMY AND ADENOIDECTOMY  age 17  . [place] EXTRACTION  2006   The following portions  of the patient's history were reviewed and updated as appropriate: allergies, current medications, past family history, past medical history, past social history, past surgical history and problem list.   Health Maintenance:  Normal pap and negative HRHPV on 02-23-2017.    Review of Systems:  Pertinent items noted in HPI and remainder of comprehensive ROS otherwise negative.  Physical Exam:  Physical exam deferred due to nature of the encounter  Labs and Imaging No results found for this or any previous visit (from the past 672 hour(s)). No results found.     Assessment and Plan:     1. Postpartum care following cesarean delivery  2. Abnormal uterine bleeding (AUB) - Ovulatory Dysfunction - Micronor discontinued and OCP's started since she is no longer breast feeding.  3. Encounter for other general counseling and advice on contraception  4. Encounter for initial prescription of contraceptive pills Rx: - norethindrone-ethinyl estradiol 1/35 ([place] 1/35, 28,) tablet; Take 1 tablet by mouth daily.  Dispense: 1 Package; Refill: 11  5. Follow up in 3 months for Annual    I discussed the assessment and treatment plan with the patient. The patient was provided an opportunity to ask questions and all were answered. The patient agreed with the plan and demonstrated an understanding of the instructions.   The patient was advised to call back or seek an in-person evaluation/go to the ED if the symptoms worsen or if the condition fails to improve as anticipated.  I provided 10 minutes of non-face-to-face time during this encounter.   Baltazar Najjar,  MD [facility], [facility] 05-31-2018

## 2018-06-22 ENCOUNTER — Encounter: Payer: Self-pay | Admitting: Obstetrics

## 2018-06-22 ENCOUNTER — Ambulatory Visit (INDEPENDENT_AMBULATORY_CARE_PROVIDER_SITE_OTHER): Payer: Managed Care, Other (non HMO) | Admitting: Obstetrics

## 2018-06-22 ENCOUNTER — Other Ambulatory Visit: Payer: Self-pay

## 2018-06-22 DIAGNOSIS — B373 Candidiasis of vulva and vagina: Secondary | ICD-10-CM

## 2018-06-22 DIAGNOSIS — N939 Abnormal uterine and vaginal bleeding, unspecified: Secondary | ICD-10-CM | POA: Diagnosis not present

## 2018-06-22 DIAGNOSIS — B3731 Acute candidiasis of vulva and vagina: Secondary | ICD-10-CM

## 2018-06-22 DIAGNOSIS — Z3041 Encounter for surveillance of contraceptive pills: Secondary | ICD-10-CM | POA: Diagnosis not present

## 2018-06-22 DIAGNOSIS — A630 Anogenital (venereal) warts: Secondary | ICD-10-CM | POA: Diagnosis not present

## 2018-06-22 NOTE — Progress Notes (Signed)
S/w pt via tele visit, pt states that since [facility] pills were changed, bleeding is better, complains of some spotting. LMP 06-20-18. Pt also complains of vaginal itching/irritation, states that she has been using boric acid frequently and thinks she has a yeast infection.

## 2018-06-22 NOTE — Progress Notes (Signed)
TELEHEALTH VIRTUAL GYNECOLOGY VISIT ENCOUNTER NOTE  I connected with Krista Hogan on 06/22/18 at  2:30 PM EDT by telephone at home and verified that I am speaking with the correct person using two identifiers.   I discussed the limitations, risks, security and privacy concerns of performing an evaluation and management service by telephone and the availability of in person appointments. I also discussed with the patient that there may be a patient responsible charge related to this service. The patient expressed understanding and agreed to proceed.   History:  Krista Hogan is a 38 y.o. G102P1011 female being evaluated today for contraceptive management. She denies any abnormal vaginal discharge, bleeding, pelvic pain or other concerns.  Now having regular periods on OCP's with occasional spotting.  Also complains of recurrent vaginal yeast infections, and is using boric acid with some improvement.     Past Medical History:  Diagnosis Date  . DUB (dysfunctional uterine bleeding)   . Graves disease 06/2016  . History of abnormal cervical Pap smear    2011  . History of HPV infection   . Uterine fibroid   . Wears glasses    Past Surgical History:  Procedure Laterality Date  . CESAREAN SECTION N/A 01/06/2018   Procedure: CESAREAN SECTION;  Surgeon: Caren Macadam, MD;  Location: [facility];  Service: Obstetrics;  Laterality: N/A;  . EXCISION OF SKIN TAG  11/22/2014   Procedure: EXCISION OF SKIN TAG VULVA;  Surgeon: Governor Specking, MD;  Location: [place];  Service: Gynecology;;  . LAPAROSCOPIC GELPORT ASSISTED MYOMECTOMY N/A 11/22/2014   Procedure: LAPAROSCOPIC GELPORT ASSISTED MYOMECTOMY WITH CHROMOTUBATION;  Surgeon: Governor Specking, MD;  Location: [facility];  Service: Gynecology;  Laterality: N/A;  . LAPAROSCOPY N/A 11/22/2014   Procedure: LAPAROSCOPY DIAGNOSTIC;  Surgeon: Governor Specking, MD;  Location: [facility];  Service: Gynecology;  Laterality: N/A;  . LEEP  2011  . TONSILLECTOMY AND ADENOIDECTOMY  age 35  . [place] EXTRACTION  2006   The following portions of the patient's history were reviewed and updated as appropriate: allergies, current medications, past family history, past medical history, past social history, past surgical history and problem list.   Health Maintenance:  Normal pap and negative HRHPV on 02-23-2017.    Review of Systems:  Pertinent items noted in HPI and remainder of comprehensive ROS otherwise negative.  Physical Exam:   General:  Alert, oriented and cooperative.   Mental Status: Normal mood and affect perceived. Normal judgment and thought content.  Physical exam deferred due to nature of the encounter  Labs and Imaging No results found for this or any previous visit (from the past 336 hour(s)). No results found.    Assessment and Plan:     1. Abnormal uterine bleeding (AUB) - O - improved with OCP's  2. Encounter for surveillance of contraceptive pills - pleased with OCP's  3. Candida vaginitis - continue Boric Acid suppositories  4. Genital warts - will follow clinically, and treat prn with TCA 80%      I discussed the assessment and treatment plan with the patient. The patient was provided an opportunity to ask questions and all were answered. The patient agreed with the plan and demonstrated an understanding of the instructions.   The patient was advised to call back or seek an in-person evaluation/go to the ED if the symptoms worsen or if the condition fails to improve as anticipated.  I provided 8 minutes  of non-face-to-face time during this encounter.   Baltazar Najjar, MD Center for [facility], [facility] 06-22-2018

## 2018-08-03 ENCOUNTER — Other Ambulatory Visit: Payer: Self-pay | Admitting: Obstetrics

## 2018-08-03 DIAGNOSIS — O0990 Supervision of high risk pregnancy, unspecified, unspecified trimester: Secondary | ICD-10-CM

## 2018-08-19 ENCOUNTER — Other Ambulatory Visit: Payer: Self-pay

## 2018-08-23 ENCOUNTER — Other Ambulatory Visit: Payer: Self-pay

## 2018-08-23 ENCOUNTER — Encounter: Payer: Self-pay | Admitting: Internal Medicine

## 2018-08-23 ENCOUNTER — Ambulatory Visit: Payer: Managed Care, Other (non HMO) | Admitting: Internal Medicine

## 2018-08-23 VITALS — BP 118/70 | HR 82 | Ht 63.0 in | Wt 204.0 lb

## 2018-08-23 DIAGNOSIS — E0500 Thyrotoxicosis with diffuse goiter without thyrotoxic crisis or storm: Secondary | ICD-10-CM | POA: Diagnosis not present

## 2018-08-23 LAB — T3, FREE: T3, Free: 3.3 pg/mL (ref 2.3–4.2)

## 2018-08-23 LAB — T4, FREE: Free T4: 0.83 ng/dL (ref 0.60–1.60)

## 2018-08-23 LAB — TSH: TSH: 0.44 u[IU]/mL (ref 0.35–4.50)

## 2018-08-23 NOTE — Patient Instructions (Signed)
Please stop at the lab.  Please come back for labs in 6 months, but for another appointment in 1 year.

## 2018-08-23 NOTE — Progress Notes (Signed)
Patient ID: Krista Hogan, female   DOB: [birth year], 38 y.o.   MRN: [id number]    HPI  Krista Hogan is a 38 y.o.-year-old female, returning for f/u for Graves ds. She previously saw Dr. Loanne Drilling, last visit 11/2015. Last visit with me almost a year ago.  She gave birth to a healthy baby in 12/2017.  Reviewed and addended history: She was dx'ed with thyrotoxicosis in summer 2017. She was Rx'ed MMI >> initially refused 2/2 concern for poss. weight gain   After I saw her, we checked labs which confirmed Graves' disease so he started the low-dose methimazole, 2.5 mg daily.  She initially developed headaches, but then she realized that this could have been due to allergies.  Due to the plans for pregnancy, we switched to a low-dose PTU, 50 mg daily.  We did discuss at that time that at the beginning of the second trimester, she will need to switch back to methimazole, but she did not contact me when she entered the second trimester, therefore, she continued PTU until set of labs in 09/2017.    Since the tests were excellent, we stopped PTU at that time.  She did well during the pregnancy but she was lost for follow-up since 10/2017.  Reviewed her TFTs: Lab Results  Component Value Date   TSH 1.17 11/02/2017   TSH 1.28 10/07/2017   TSH 0.39 06/25/2017   TSH 0.50 01/01/2017   TSH 0.00 (L) 09/23/2016   TSH 0.01 (L) 10/20/2015   FREET4 0.65 11/02/2017   FREET4 0.62 10/07/2017   FREET4 0.72 06/25/2017   FREET4 0.73 01/01/2017   FREET4 1.05 09/23/2016   FREET4 2.0 (H) 10/20/2015  08/2016: TSH 0.011 (received after appt)  We diagnosed Graves' disease based on disease course and also elevated TSI antibodies, however, TSI antibodies normalized at last check: Lab Results  Component Value Date   TSI 126 11/02/2017   TSI 273 (H) 09/23/2016   Pt denies: - feeling nodules in neck - hoarseness - dysphagia - choking - SOB with lying down  Pt does have a FH of thyroid ds. In cousin  No  FH of thyroid cancer. No h/o radiation tx to head or neck.  No seaweed or kelp. No recent contrast studies. No herbal supplements. + Biotin use (hair skin and nails + B complex) - last dose yesterday am. No recent steroids use.   Pt. also has a history of myomectomy.  ROS: Constitutional: no weight gain/+ weight loss, no fatigue, no subjective hyperthermia, no subjective hypothermia Eyes: no blurry vision, no xerophthalmia ENT: no sore throat, + see HPI Cardiovascular: no CP/no SOB/no palpitations/no leg swelling Respiratory: no cough/no SOB/no wheezing Gastrointestinal: no N/no V/no D/no C/no acid reflux Musculoskeletal: no muscle aches/no joint aches Skin: no rashes, no hair loss Neurological: no tremors/no numbness/no tingling/no dizziness  I reviewed pt's medications, allergies, PMH, social hx, family hx, and changes were documented in the history of present illness. Otherwise, unchanged from my initial visit note.  Past Medical History:  Diagnosis Date  . DUB (dysfunctional uterine bleeding)   . Graves disease 06/2016  . History of abnormal cervical Pap smear    2011  . History of HPV infection   . Uterine fibroid   . Wears glasses    Past Surgical History:  Procedure Laterality Date  . CESAREAN SECTION N/A 01/06/2018   Procedure: CESAREAN SECTION;  Surgeon: Caren Macadam, MD;  Location: [facility];  Service: Obstetrics;  Laterality: N/A;  .  EXCISION OF SKIN TAG  11/22/2014   Procedure: EXCISION OF SKIN TAG VULVA;  Surgeon: Governor Specking, MD;  Location: [place];  Service: Gynecology;;  . LAPAROSCOPIC GELPORT ASSISTED MYOMECTOMY N/A 11/22/2014   Procedure: LAPAROSCOPIC GELPORT ASSISTED MYOMECTOMY WITH CHROMOTUBATION;  Surgeon: Governor Specking, MD;  Location: [facility];  Service: Gynecology;  Laterality: N/A;  . LAPAROSCOPY N/A 11/22/2014   Procedure: LAPAROSCOPY DIAGNOSTIC;  Surgeon: Governor Specking, MD;  Location:  [facility];  Service: Gynecology;  Laterality: N/A;  . LEEP  2011  . TONSILLECTOMY AND ADENOIDECTOMY  age 76  . WISDOM TOOTH EXTRACTION  2006   Social History   Social History  . Marital status: Single    Spouse name: N/A  . Number of children: 0   Occupational History  . Lab tech   Social History Main Topics  . Smoking status: Current Some Day Smoker    Packs/day: 0.25    Years: 10.00    Types: Cigarettes  . Smokeless tobacco: Never Used  . Alcohol use 0.0 oz/week     Comment: SOCIAL  . Drug use: No  . Sexual activity: Yes    Birth control/ protection: None   Current Outpatient Medications on File Prior to Visit  Medication Sig Dispense Refill  . acetaminophen (TYLENOL) 500 MG tablet Take 500 mg by mouth every 6 (six) hours as needed for moderate pain.    . cetirizine (ZYRTEC) 10 MG tablet Take 10 mg by mouth daily.    . cholecalciferol (VITAMIN D) 1000 units tablet Take 1,000 Units by mouth daily.    . cyanocobalamin 1000 MCG tablet Take 1,000 mcg by mouth daily.    . hydrochlorothiazide (HYDRODIURIL) 25 MG tablet Take 1 tablet (25 mg total) by mouth daily. 30 tablet 0  . ibuprofen (ADVIL,MOTRIN) 600 MG tablet Take 1 tablet (600 mg total) by mouth every 6 (six) hours. 60 tablet 1  . norethindrone-ethinyl estradiol 1/35 ([place] 1/35, 28,) tablet Take 1 tablet by mouth daily. 1 Package 11  . Omega-3 Fatty Acids (FISH OIL) 1000 MG CAPS Take 1,000 mg by mouth daily.     . Prenat w/o A-FeCb-FeGl-DSS-FA (CITRANATAL RX) 27-1 MG TABS Take 1 tablet by mouth daily before breakfast. 90 each 4  . Prenatal Vit-Fe Fumarate-FA (PRENATAL VITAMIN PLUS LOW IRON) 27-1 MG TABS TAKE 1 TABLET BY MOUTH ONCE DAILY BEFORE BREAKFAST 30 tablet 0  . senna-docusate (SENOKOT-S) 8.6-50 MG tablet Take 2 tablets by mouth daily. 20 tablet 0   No current facility-administered medications on file prior to visit.    No Known Allergies Family History  Problem Relation Age of Onset  .  Fibroids Mother   . Thyroid disease Neg Hx    PE: BP 118/70   Pulse 82   Ht 5\' 3"  (1.6 m)   Wt 204 lb (92.5 kg)   SpO2 98%   BMI 36.14 kg/m  Wt Readings from Last 3 Encounters:  08/23/18 204 lb (92.5 kg)  04/27/18 211 lb 8 oz (95.9 kg)  03/04/18 216 lb (98 kg)   Constitutional: overweight, in NAD Eyes: PERRLA, EOMI, no exophthalmos ENT: moist mucous membranes, + mild thyromegaly, no cervical lymphadenopathy Cardiovascular: RRR, No MRG Respiratory: CTA B Gastrointestinal: abdomen soft, NT, ND, BS+ Musculoskeletal: no deformities, strength intact in all 4 Skin: moist, warm, no rashes Neurological: no tremor with outstretched hands, DTR normal in all 4  ASSESSMENT: 1. Graves ds  PLAN:  1. Patient with a history of Graves'  disease, diagnosed based on the course of the disease and elevated TSI antibodies.  Therefore, we skipped a thyroid uptake and scan for diagnosis. -At last visit, she was pregnant, 6 months along.  This visit, she is postpartum.  She has an uneventful birth and the baby is healthy. -In 09/2017, we were able to stop PTU completely, and she did not require the medication during the pregnancy.  However, she was lost to follow-up after 10/2017 -At this visit, she has no hyperthyroid symptoms: No unintentional weight loss (just postpartum), palpitations, heat intolerance, insomnia, anxiety, hyper defecation -We will recheck her TFTs today and she may need to start back on thionamides -no plans for another pregnancy -I will see her back in a year but with labs in 6 months.  - time spent with the patient: 15 minutes, of which >50% was spent in obtaining information about her symptoms, reviewing her previous labs, evaluations, and treatments, counseling her about her condition (please see the discussed topics above), and developing a plan to further investigate and treat it; she had a number of questions which I addressed.  Component     Latest Ref Rng & Units  08/23/2018  TSH     0.35 - 4.50 uIU/mL 0.44  T4,Free(Direct)     0.60 - 1.60 ng/dL 0.83  Triiodothyronine,Free,Serum     2.3 - 4.2 pg/mL 3.3   Thyroid tests remain normal.  We will continue with the plan to check her labs in 6 months and then see her back again.  Philemon Kingdom, MD PhD [facility]

## 2018-09-22 ENCOUNTER — Other Ambulatory Visit: Payer: Self-pay | Admitting: Obstetrics

## 2018-09-22 DIAGNOSIS — A630 Anogenital (venereal) warts: Secondary | ICD-10-CM

## 2018-09-22 MED ORDER — IMIQUIMOD 5 % EX CREA: TOPICAL_CREAM | CUTANEOUS | 2 refills | Status: DC

## 2018-10-29 IMAGING — US US MFM FETAL BPP W/O NON-STRESS
1 series · 14 of 28 positions shown · non-contrast
Comparison: none

[Series 1: us mfm fetal bpp w/o non-stress · 34 acquisitions, 14 frames shown]
[im 2/34]
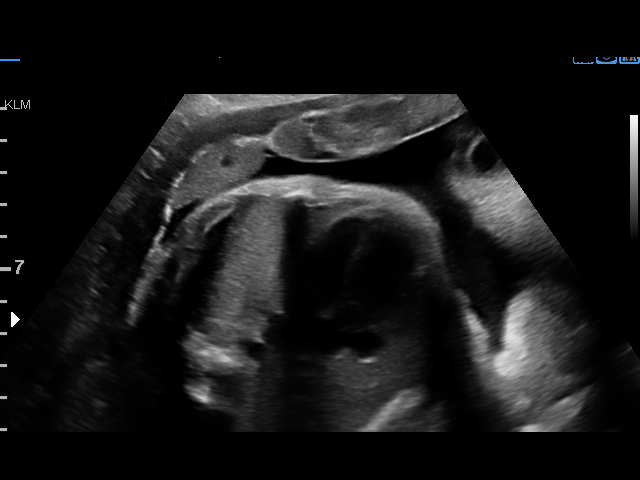
[im 4/34]
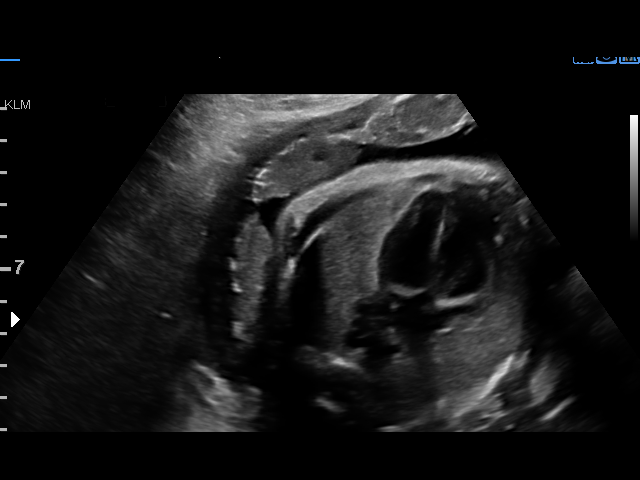
[im 7/34]
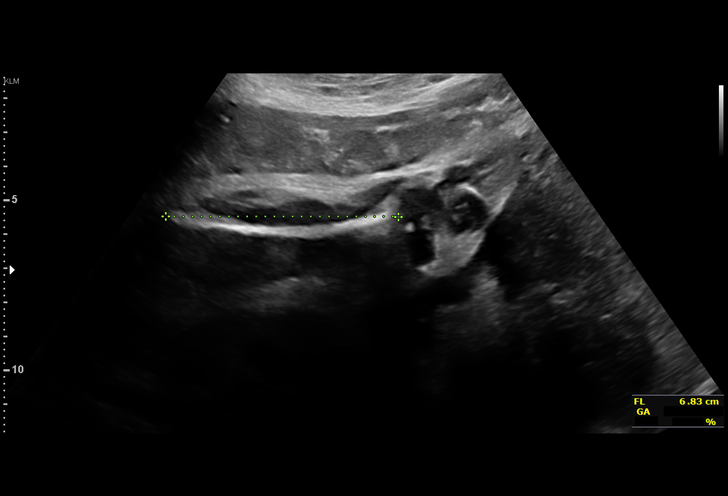
[im 9/34]
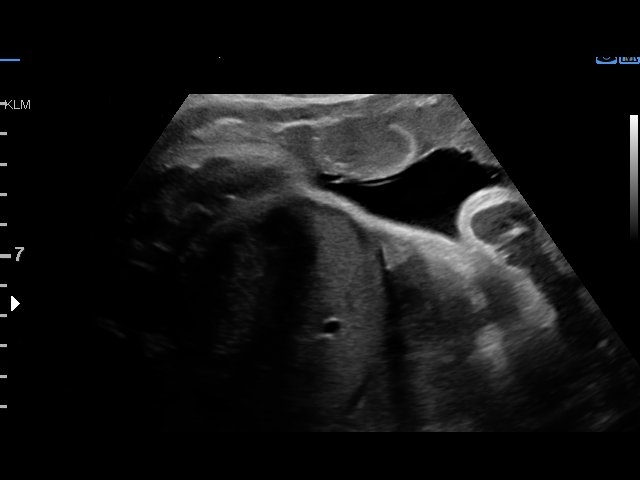
[im 12/34]
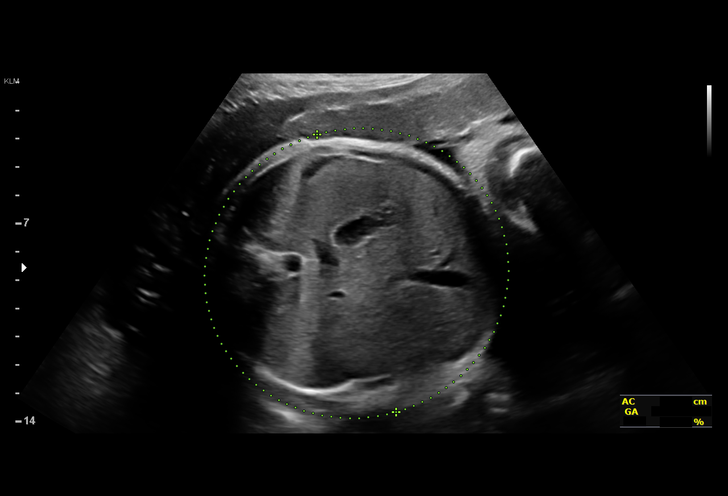
[im 14/34]
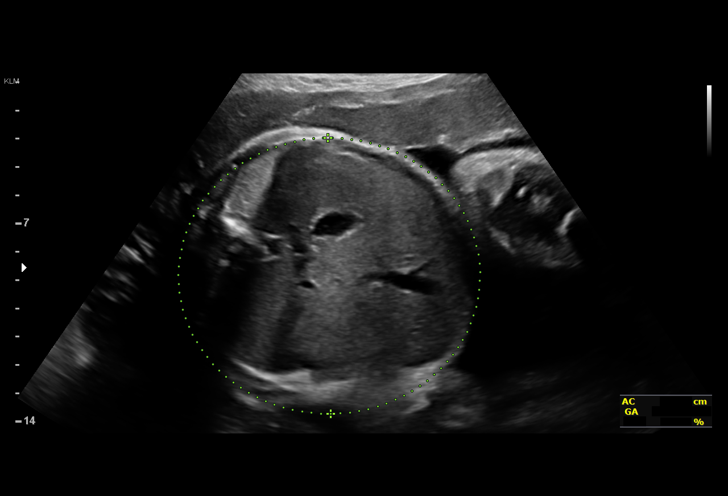
[im 16/34]
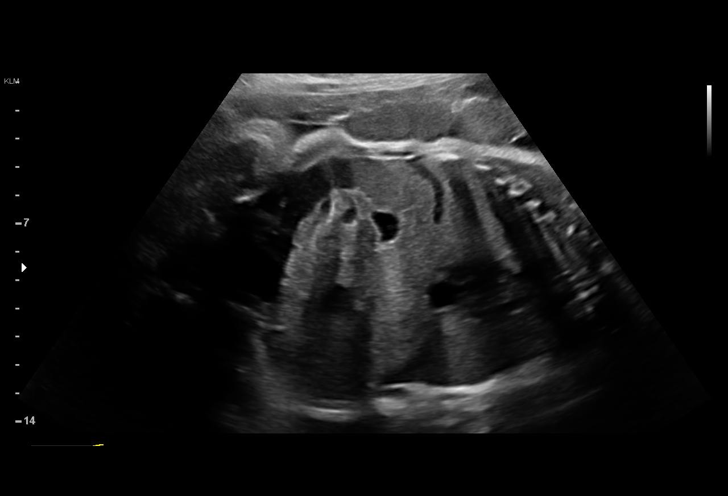
[im 19/34]
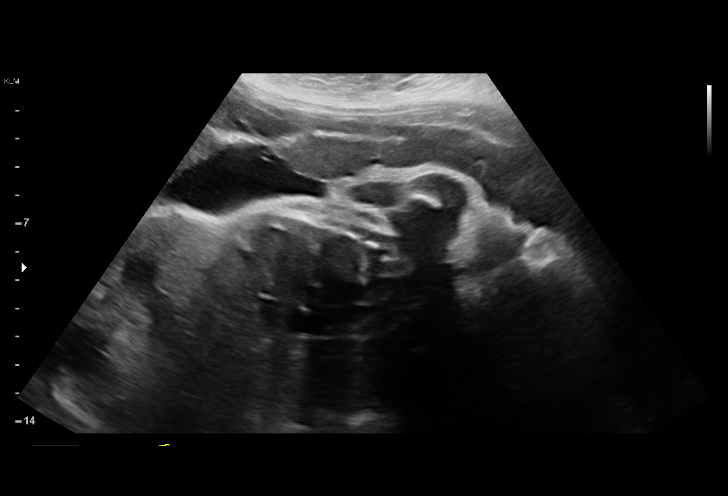
[im 21/34]
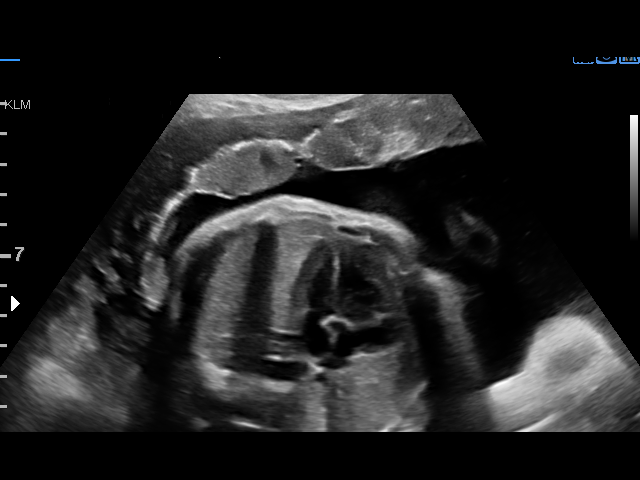
[im 24/34]
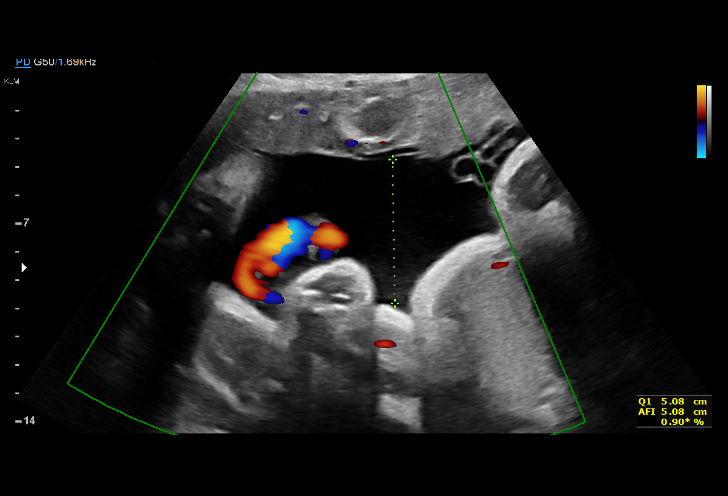
[im 26/34]
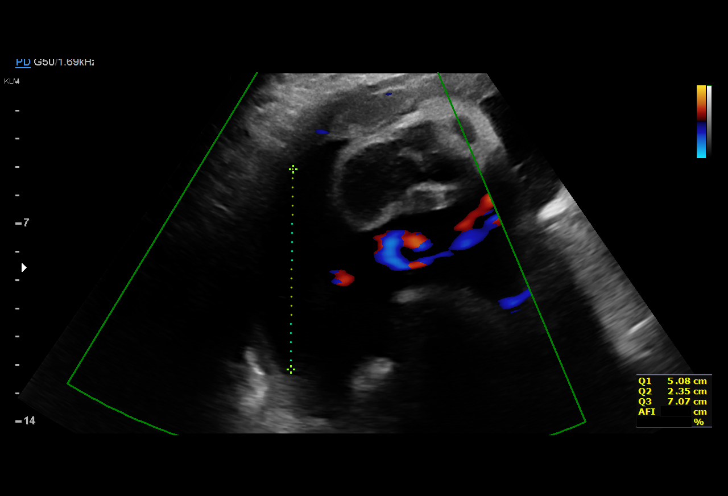
[im 29/34]
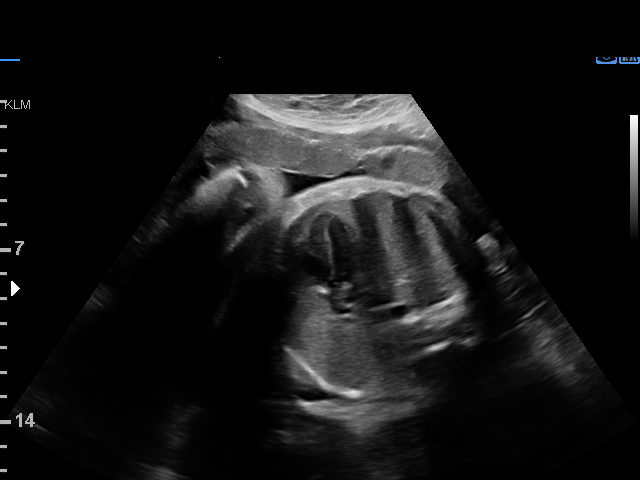
[im 31/34]
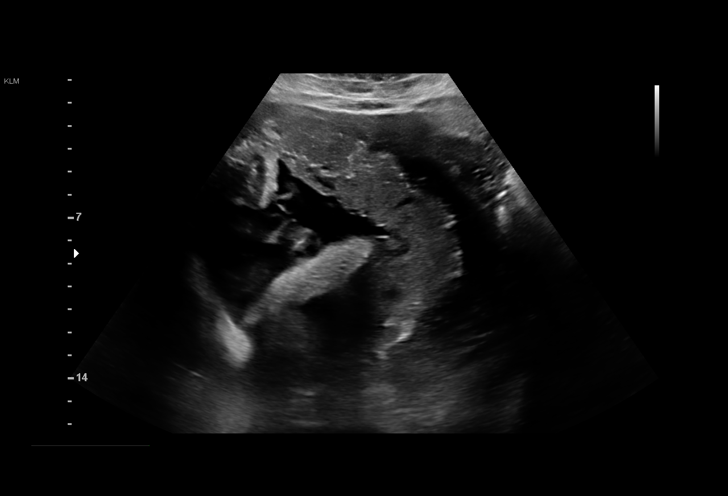
[im 34/34]
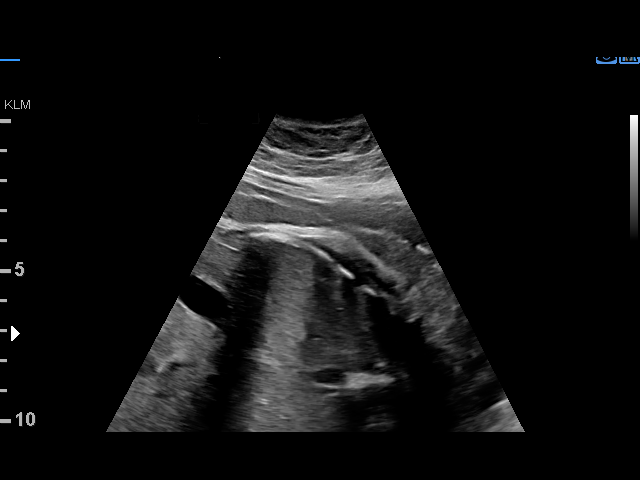

[14 of 28 positions shown; findings below may reference images not displayed]

Indications

35 weeks gestation of pregnancy
Thyroid disease in pregnancy (Grave's          O99.280,
disease, in remission)
Uterine scar, other than C/S (myomectomy)
Personal history of congenital abnormality
(polydactyly - left)
Obesity complicating pregnancy, third
trimester
Advanced maternal age multigravida 35+,
third trimester (low risk NIPS)
Gestational diabetes in pregnancy,
unspecified control
Vital Signs

Height:        5'4"
Fetal Evaluation

Num Of Fetuses:         1
Fetal Heart Rate(bpm):  148
Cardiac Activity:       Observed
Presentation:           Cephalic
Placenta:               Anterior
P. Cord Insertion:      Previously Visualized

Amniotic Fluid
AFI FV:      Within normal limits
AFI Sum(cm)     %Tile       Largest Pocket(cm)
18.69           70

RUQ(cm)       RLQ(cm)       LUQ(cm)        LLQ(cm)
5.08
Biometry

BPD:      88.9  mm     G. Age:  36w 0d         64  %    CI:        78.14   %    70 - 86
FL/HC:      21.3   %    20.1 -
HC:      318.2  mm     G. Age:  35w 6d         22  %    HC/AC:      0.99        0.93 -
AC:      322.1  mm     G. Age:  36w 1d         70  %    FL/BPD:     76.4   %    71 - 87
FL:       67.9  mm     G. Age:  34w 6d         24  %    FL/AC:      21.1   %    20 - 24

Est. FW:    3066  gm      6 lb 1 oz     64  %
OB History

Gravidity:    2         Term:   0        Prem:   0        SAB:   1
TOP:          0       Ectopic:  0        Living: 0
Gestational Age

LMP:           35w 5d        Date:  04/22/17                 EDD:   01/27/18
U/S Today:     35w 5d                                        EDD:   01/27/18
Best:          35w 5d     Det. By:  Previous Ultrasound      EDD:   01/27/18
(06/22/17)
Anatomy

Cranium:               Appears normal         Aortic Arch:            Previously seen
Cavum:                 Previously seen        Ductal Arch:            Previously seen
Ventricles:            Previously seen        Diaphragm:              Previously seen
Choroid Plexus:        Previously seen        Stomach:                Appears normal, left
sided
Cerebellum:            Previously seen        Abdomen:                Appears normal
Posterior Fossa:       Previously seen        Abdominal Wall:         Previously seen
Nuchal Fold:           Previously seen        Cord Vessels:           Previously seen
Face:                  Orbits and profile     Kidneys:                Appear normal
previously seen
Lips:                  Previously seen        Bladder:                Appears normal
Thoracic:              Appears normal         Spine:                  Previously seen
Heart:                 Previously seen        Upper Extremities:      ? Right
Polydactyly prev.
seen
RVOT:                  Previously seen        Lower Extremities:      Previously seen
LVOT:                  Appears normal

Other:  Female gender. Heels, 5th digit, and Nasal bone previously
visualized. Technically difficult due to fetal position.
Cervix Uterus Adnexa

Cervix
Not visualized (advanced GA >93wks)
Impression

Gestational diabetes: Patient is not checking her blood
glucose and it is not possible to assess control of diabetes.
Graves' disease: Euthyroid now and the patient is not taking
antithyroid drugs.
History of myomectomy: Planned cesarean delivery at 37
weeks.

Amniotic fluid is normal and good fetal activity is seen. Fetal
growth is appropriate for gestational age. Antenatal testing is
reassuring.

We reassured the patient of the findings. Patient understands
that poorly-controlled diabetes is associated with increased
fetal stillbirth rate.

She informed that she will go to [REDACTED] and get her supplies
to initiate blood glucose monitoring.
Recommendations

-Antenatal testing next week.

## 2018-11-23 ENCOUNTER — Encounter: Payer: Self-pay | Admitting: Internal Medicine

## 2018-11-23 NOTE — Telephone Encounter (Signed)
Please advise 

## 2019-01-25 ENCOUNTER — Other Ambulatory Visit: Payer: Self-pay | Admitting: Obstetrics

## 2019-01-25 ENCOUNTER — Encounter: Payer: Self-pay | Admitting: Obstetrics

## 2019-01-25 ENCOUNTER — Ambulatory Visit: Payer: Managed Care, Other (non HMO) | Admitting: Obstetrics

## 2019-01-25 ENCOUNTER — Other Ambulatory Visit: Payer: Self-pay

## 2019-01-25 VITALS — BP 127/84 | HR 72 | Temp 98.9°F | Ht 63.0 in | Wt 217.8 lb

## 2019-01-25 DIAGNOSIS — Z113 Encounter for screening for infections with a predominantly sexual mode of transmission: Secondary | ICD-10-CM

## 2019-01-25 DIAGNOSIS — Z124 Encounter for screening for malignant neoplasm of cervix: Secondary | ICD-10-CM

## 2019-01-25 DIAGNOSIS — M79605 Pain in left leg: Secondary | ICD-10-CM

## 2019-01-25 DIAGNOSIS — Z01419 Encounter for gynecological examination (general) (routine) without abnormal findings: Secondary | ICD-10-CM

## 2019-01-25 DIAGNOSIS — N944 Primary dysmenorrhea: Secondary | ICD-10-CM

## 2019-01-25 DIAGNOSIS — K640 First degree hemorrhoids: Secondary | ICD-10-CM

## 2019-01-25 DIAGNOSIS — Z3041 Encounter for surveillance of contraceptive pills: Secondary | ICD-10-CM

## 2019-01-25 DIAGNOSIS — N898 Other specified noninflammatory disorders of vagina: Secondary | ICD-10-CM | POA: Diagnosis not present

## 2019-01-25 MED ORDER — ORTHO-NOVUM 1/35 (28) 1-35 MG-MCG PO TABS: 1.0000 | ORAL_TABLET | Freq: Every day | ORAL | 11 refills | Status: DC

## 2019-01-25 MED ORDER — FLUCONAZOLE 150 MG PO TABS: 150.0000 mg | ORAL_TABLET | Freq: Once | ORAL | 2 refills | Status: AC

## 2019-01-25 MED ORDER — IBUPROFEN 600 MG PO TABS: 600.0000 mg | ORAL_TABLET | Freq: Four times a day (QID) | ORAL | 1 refills | Status: DC

## 2019-01-25 NOTE — Progress Notes (Signed)
GYN presents for AEX/PAP/STD testing.  Need refills on BC.  C/o hemmroids.  Wants Referral to Ortho for nerve damage in leg.  GAD-7=2

## 2019-01-25 NOTE — Progress Notes (Signed)
Subjective:        Krista Hogan is a 38 y.o. female here for a routine exam.  Current complaints: Persistent genital warts despite course of Aldara.  Vaginal discharge and itching.  Hemorrhoids that occasionally flare.  Has had left leg weakness and pain since delivery of baby. .    Personal health questionnaire:  Is patient Ashkenazi Jewish, have a family history of breast and/or ovarian cancer: no Is there a family history of uterine cancer diagnosed at age < 48, gastrointestinal cancer, urinary tract cancer, family member who is a Field seismologist syndrome-associated carrier: no Is the patient overweight and hypertensive, family history of diabetes, personal history of gestational diabetes, preeclampsia or PCOS: no Is patient over 54, have PCOS,  family history of premature CHD under age 75, diabetes, smoke, have hypertension or peripheral artery disease:  no At any time, has a partner hit, kicked or otherwise hurt or frightened you?: no Over the past 2 weeks, have you felt down, depressed or hopeless?: no Over the past 2 weeks, have you felt little interest or pleasure in doing things?:no   Gynecologic History Patient's last menstrual period was 01/12/2019 (approximate). Contraception: OCP (estrogen/progesterone) Last Pap: 02-23-2017. Results were: normal Last mammogram: n/a. Results were: n/a  Obstetric History OB History  Gravida Para Term Preterm AB Living  2 1 1  0 1 1  SAB TAB Ectopic Multiple Live Births  1 0 0 0 1    # Outcome Date GA Lbr Len/2nd Weight Sex Delivery Anes PTL Lv  2 Term 01/06/18 [redacted]w[redacted]d  6 lb 1.4 oz (2.76 kg) F CS-LTranv Spinal  LIV  1 SAB 04/22/17     SAB       Past Medical History:  Diagnosis Date  . DUB (dysfunctional uterine bleeding)   . Graves disease 06/2016  . History of abnormal cervical Pap smear    2011  . History of HPV infection   . Uterine fibroid   . Wears glasses     Past Surgical History:  Procedure Laterality Date  . CESAREAN  SECTION N/A 01/06/2018   Procedure: CESAREAN SECTION;  Surgeon: Caren Macadam, MD;  Location: [place];  Service: Obstetrics;  Laterality: N/A;  . EXCISION OF SKIN TAG  11/22/2014   Procedure: EXCISION OF SKIN TAG VULVA;  Surgeon: Governor Specking, MD;  Location: [place];  Service: Gynecology;;  . LAPAROSCOPIC GELPORT ASSISTED MYOMECTOMY N/A 11/22/2014   Procedure: LAPAROSCOPIC GELPORT ASSISTED MYOMECTOMY WITH CHROMOTUBATION;  Surgeon: Governor Specking, MD;  Location: [place];  Service: Gynecology;  Laterality: N/A;  . LAPAROSCOPY N/A 11/22/2014   Procedure: LAPAROSCOPY DIAGNOSTIC;  Surgeon: Governor Specking, MD;  Location: [place];  Service: Gynecology;  Laterality: N/A;  . LEEP  2011  . TONSILLECTOMY AND ADENOIDECTOMY  age 21  . WISDOM TOOTH EXTRACTION  2006     Current Outpatient Medications:  .  acetaminophen (TYLENOL) 500 MG tablet, Take 500 mg by mouth every 6 (six) hours as needed for moderate pain., Disp: , Rfl:  .  cetirizine (ZYRTEC) 10 MG tablet, Take 10 mg by mouth daily., Disp: , Rfl:  .  cholecalciferol (VITAMIN D) 1000 units tablet, Take 1,000 Units by mouth daily., Disp: , Rfl:  .  cyanocobalamin 1000 MCG tablet, Take 1,000 mcg by mouth daily., Disp: , Rfl:  .  ibuprofen (ADVIL) 600 MG tablet, Take 1 tablet (600 mg total) by mouth every 6 (six) hours., Disp: 60 tablet, Rfl: 1 .  norethindrone-ethinyl estradiol 1/35 ([place] 1/35, 28,) tablet, Take 1 tablet by mouth daily., Disp: 1 Package, Rfl: 11 .  Omega-3 Fatty Acids (FISH OIL) 1000 MG CAPS, Take 1,000 mg by mouth daily. , Disp: , Rfl:  .  fluconazole (DIFLUCAN) 150 MG tablet, Take 1 tablet (150 mg total) by mouth once for 1 dose., Disp: 1 tablet, Rfl: 2 .  hydrochlorothiazide (HYDRODIURIL) 25 MG tablet, Take 1 tablet (25 mg total) by mouth daily. (Patient not taking: Reported on 01/25/2019), Disp: 30 tablet, Rfl: 0 .  imiquimod (ALDARA) 5 % cream,  Apply sparingly 3 times a week at bedtime for up to 16 weeks.  Wash off after 8 hours. (Patient not taking: Reported on 01/25/2019), Disp: 24 each, Rfl: 2 .  Prenat w/o A-FeCb-FeGl-DSS-FA (CITRANATAL RX) 27-1 MG TABS, Take 1 tablet by mouth daily before breakfast. (Patient not taking: Reported on 01/25/2019), Disp: 90 each, Rfl: 4 .  Prenatal Vit-Fe Fumarate-FA (PRENATAL VITAMIN PLUS LOW IRON) 27-1 MG TABS, TAKE 1 TABLET BY MOUTH ONCE DAILY BEFORE BREAKFAST (Patient not taking: Reported on 01/25/2019), Disp: 30 tablet, Rfl: 0 .  senna-docusate (SENOKOT-S) 8.6-50 MG tablet, Take 2 tablets by mouth daily. (Patient not taking: Reported on 01/25/2019), Disp: 20 tablet, Rfl: 0 No Known Allergies  Social History   Tobacco Use  . Smoking status: Former Smoker    Packs/day: 0.25    Years: 10.00    Pack years: 2.50    Types: Cigarettes    Quit date: 04/2017    Years since quitting: 1.7  . Smokeless tobacco: Never Used  Substance Use Topics  . Alcohol use: Yes    Alcohol/week: 0.0 standard drinks    Comment: SOCIAL    Family History  Problem Relation Age of Onset  . Fibroids Mother   . Thyroid disease Neg Hx       Review of Systems  Constitutional: negative for fatigue and weight loss Respiratory: negative for cough and wheezing Cardiovascular: negative for chest pain, fatigue and palpitations Gastrointestinal: negative for abdominal pain and change in bowel habits Musculoskeletal:negative for myalgias Neurological: negative for gait problems and tremors Behavioral/Psych: negative for abusive relationship, depression Endocrine: negative for temperature intolerance    Genitourinary:negative for abnormal menstrual periods, genital lesions, hot flashes, sexual problems and vaginal discharge Integument/breast: negative for breast lump, breast tenderness, nipple discharge and skin lesion(s)    Objective:       BP 127/84   Pulse 72   Temp 98.9 F (37.2 C)   Ht 5\' 3"  (1.6 m)   Wt 217  lb 12.8 oz (98.8 kg)   LMP 01/12/2019 (Approximate)   BMI 38.58 kg/m  General:   alert  Skin:   no rash or abnormalities  Lungs:   clear to auscultation bilaterally  Heart:   regular rate and rhythm, S1, S2 normal, no murmur, click, rub or gallop  Breasts:   normal without suspicious masses, skin or nipple changes or axillary nodes  Abdomen:  normal findings: no organomegaly, soft, non-tender and no hernia  Pelvis:  External genitalia: normal general appearance Urinary system: urethral meatus normal and bladder without fullness, nontender Vaginal: normal without tenderness, induration or masses Cervix: normal appearance Adnexa: normal bimanual exam Uterus: anteverted and non-tender, normal size   Lab Review Urine pregnancy test Labs reviewed yes Radiologic studies reviewed no  50% of 25 min visit spent on counseling and coordination of care.   Assessment:     1. Encounter for routine gynecological examination with Papanicolaou smear  of cervix Rx: - Cytology - PAP( [place])  2. Vaginal discharge Rx: - Cervicovaginal ancillary only( [facility]) - fluconazole (DIFLUCAN) 150 MG tablet; Take 1 tablet (150 mg total) by mouth once for 1 dose.  Dispense: 1 tablet; Refill: 2  3. Screen for STD (sexually transmitted disease) Rx: - HIV antibody (with reflex) - Hepatitis C Antibody - Hepatitis B Surface AntiGEN - RPR  4. Primary dysmenorrhea Rx: - ibuprofen (ADVIL) 600 MG tablet; Take 1 tablet (600 mg total) by mouth every 6 (six) hours.  Dispense: 60 tablet; Refill: 1  5. Encounter for surveillance of contraceptive pills Rx: - norethindrone-ethinyl estradiol 1/35 ([place] 1/35, 28,) tablet; Take 1 tablet by mouth daily.  Dispense: 1 Package; Refill: 11  6. Grade I hemorrhoids, external - clinically stable  7. Leg pain, lateral, left Rx: - Ambulatory referral to Neurology  8. Obesity BMI = 38, without comorbidity - program of caloric restriction, exercise and  behavioral modification recommended for weight loss and long-term maintenance    Plan:    Education reviewed: calcium supplements, depression evaluation, low fat, low cholesterol diet, safe sex/STD prevention, self breast exams and weight bearing exercise. Contraception: OCP (estrogen/progesterone). Follow up in: 1 year.   Meds ordered this encounter  Medications  . norethindrone-ethinyl estradiol 1/35 ([place] 1/35, 28,) tablet    Sig: Take 1 tablet by mouth daily.    Dispense:  1 Package    Refill:  11  . ibuprofen (ADVIL) 600 MG tablet    Sig: Take 1 tablet (600 mg total) by mouth every 6 (six) hours.    Dispense:  60 tablet    Refill:  1  . fluconazole (DIFLUCAN) 150 MG tablet    Sig: Take 1 tablet (150 mg total) by mouth once for 1 dose.    Dispense:  1 tablet    Refill:  2   Orders Placed This Encounter  Procedures  . HIV antibody (with reflex)  . Hepatitis C Antibody  . Hepatitis B Surface AntiGEN  . RPR  . Ambulatory referral to Neurology    Referral Priority:   Routine    Referral Type:   Consultation    Referral Reason:   Specialty Services Required    Requested Specialty:   Neurology    Number of Visits Requested:   1    Shelly Bombard, MD 01/25/2019 3:34 PM

## 2019-01-26 ENCOUNTER — Encounter: Payer: Self-pay | Admitting: Neurology

## 2019-01-26 ENCOUNTER — Ambulatory Visit: Payer: Managed Care, Other (non HMO) | Admitting: Neurology

## 2019-01-26 ENCOUNTER — Telehealth: Payer: Self-pay

## 2019-01-26 LAB — CERVICOVAGINAL ANCILLARY ONLY
Bacterial Vaginitis (gardnerella): NEGATIVE
Candida Glabrata: NEGATIVE
Candida Vaginitis: NEGATIVE
Chlamydia: NEGATIVE
Comment: NEGATIVE
Comment: NEGATIVE
Comment: NEGATIVE
Comment: NEGATIVE
Comment: NEGATIVE
Comment: NORMAL
Neisseria Gonorrhea: NEGATIVE
Trichomonas: NEGATIVE

## 2019-01-26 LAB — RPR: RPR Ser Ql: NONREACTIVE

## 2019-01-26 LAB — HEPATITIS C ANTIBODY: Hep C Virus Ab: 0.1 s/co ratio (ref 0.0–0.9)

## 2019-01-26 LAB — HIV ANTIBODY (ROUTINE TESTING W REFLEX): HIV Screen 4th Generation wRfx: NONREACTIVE

## 2019-01-26 LAB — HEPATITIS B SURFACE ANTIGEN: Hepatitis B Surface Ag: NEGATIVE

## 2019-01-26 NOTE — Telephone Encounter (Signed)
Pt no showed appt with Dr. Felecia Shelling on 01/26/2019.

## 2019-01-27 LAB — CYTOLOGY - PAP
Comment: NEGATIVE
High risk HPV: NEGATIVE

## 2019-02-01 ENCOUNTER — Other Ambulatory Visit: Payer: Self-pay

## 2019-02-01 ENCOUNTER — Ambulatory Visit: Payer: Managed Care, Other (non HMO) | Admitting: Neurology

## 2019-02-01 ENCOUNTER — Encounter: Payer: Self-pay | Admitting: Neurology

## 2019-02-01 DIAGNOSIS — E6609 Other obesity due to excess calories: Secondary | ICD-10-CM | POA: Insufficient documentation

## 2019-02-01 DIAGNOSIS — R1032 Left lower quadrant pain: Secondary | ICD-10-CM

## 2019-02-01 DIAGNOSIS — R1030 Lower abdominal pain, unspecified: Secondary | ICD-10-CM

## 2019-02-01 DIAGNOSIS — E669 Obesity, unspecified: Secondary | ICD-10-CM | POA: Diagnosis not present

## 2019-02-01 DIAGNOSIS — R208 Other disturbances of skin sensation: Secondary | ICD-10-CM

## 2019-02-01 HISTORY — DX: Lower abdominal pain, unspecified: R10.30

## 2019-02-01 MED ORDER — LAMOTRIGINE 100 MG PO TABS: 100.0000 mg | ORAL_TABLET | Freq: Two times a day (BID) | ORAL | 5 refills | Status: DC

## 2019-02-01 NOTE — Patient Instructions (Signed)
The pharmacy has the prescription for lamotrigine 100 mg tablets. For 5 days, just take one half pill a day. For the next 5 days, take one half pill twice a day. For the next 5 days, take one half pill 3 times a day Then start taking one pill twice a day from this point on.    In the future, we may increase the dose further.  If you get a rash, need to stop the medication and not take it again. 

## 2019-02-01 NOTE — Progress Notes (Signed)
[facility]  PATIENT: Krista Hogan DOB: 1981-02-14  REFERRING DOCTOR OR PCP:  Baltazar Najjar, MD (OB/Gyn); Glendale Chard (PCP) SOURCE: Patient, notes from PCP,  _________________________________   HISTORICAL  CHIEF COMPLAINT:  Chief Complaint  Patient presents with  . New Patient (Initial Visit)    Rm 13 with daughter referred by Baltazar Najjar, MD for lef pain/numbness- reports mostly in the left leg. Pt reports sx started during pregnancy sx are intermittent    HISTORY OF PRESENT ILLNESS:  I had the pleasure of seeing patient, Krista Hogan, at [facility] for neurologic consultation  She is a 38 year old woman who had the onset of numbness and pain in the left proximal anterolateral thigh during a pregnancy > 1 year ago.   She had to wear a pregnancy belt.     She also had weakness in the left leg and used a cane.   After she delivered symptoms persisted.   She always has some pain and numbness in the thigh near the groin and she notes episodes of weakness in the left leg multiple times a month.   In the past, the leg would improve quickly after the onset of symptoms but now weakness may last the entire day.   She denies weakness or numbness in her distal left leg or the left arm or right side.   She states she was told the symptoms should improve after delivery but she feels they have actually worsened.    Pain only occurs while she is standing.   She feels the leg gives out because of the pain and she never has weakness without pain first.    Quality of pain is cramp-like.     She has not had any imaging studies or trials of medications for the dysesthesias.   She reports having borderline diabetes during the pregnancy.  Besides OCP, she is not on any prescription medications.    REVIEW OF SYSTEMS: Constitutional: No fevers, chills, sweats, or change in appetite Eyes: No visual changes, double vision, eye pain Ear, nose and throat: No  hearing loss, ear pain, nasal congestion, sore throat Cardiovascular: No chest pain, palpitations Respiratory: No shortness of breath at rest or with exertion.   No wheezes GastrointestinaI: No nausea, vomiting, diarrhea, abdominal pain, fecal incontinence Genitourinary: No dysuria, urinary retention or frequency.  No nocturia. Musculoskeletal: No neck pain, back pain Integumentary: No rash, pruritus, skin lesions Neurological: as above Psychiatric: No depression at this time.  No anxiety Endocrine: No palpitations, diaphoresis, change in appetite, change in weigh or increased thirst Hematologic/Lymphatic: No anemia, purpura, petechiae. Allergic/Immunologic: No itchy/runny eyes, nasal congestion, recent allergic reactions, rashes  ALLERGIES: No Known Allergies  HOME MEDICATIONS:  Current Outpatient Medications:  .  acetaminophen (TYLENOL) 500 MG tablet, Take 500 mg by mouth every 6 (six) hours as needed for moderate pain., Disp: , Rfl:  .  cetirizine (ZYRTEC) 10 MG tablet, Take 10 mg by mouth daily., Disp: , Rfl:  .  cholecalciferol (VITAMIN D) 1000 units tablet, Take 1,000 Units by mouth daily., Disp: , Rfl:  .  cyanocobalamin 1000 MCG tablet, Take 1,000 mcg by mouth daily., Disp: , Rfl:  .  ibuprofen (ADVIL) 600 MG tablet, Take 1 tablet (600 mg total) by mouth every 6 (six) hours., Disp: 60 tablet, Rfl: 1 .  norethindrone-ethinyl estradiol 1/35 ([place] 1/35, 28,) tablet, Take 1 tablet by mouth daily., Disp: 1 Package, Rfl: 11 .  Omega-3 Fatty Acids (FISH OIL) 1000 MG CAPS,  Take 1,000 mg by mouth daily. , Disp: , Rfl:  .  Prenat w/o A-FeCb-FeGl-DSS-FA (CITRANATAL RX) 27-1 MG TABS, Take 1 tablet by mouth daily before breakfast., Disp: 90 each, Rfl: 4 .  Prenatal Vit-Fe Fumarate-FA (PRENATAL VITAMIN PLUS LOW IRON) 27-1 MG TABS, TAKE 1 TABLET BY MOUTH ONCE DAILY BEFORE BREAKFAST, Disp: 30 tablet, Rfl: 0 .  lamoTRIgine (LAMICTAL) 100 MG tablet, Take 1 tablet (100 mg total) by mouth  2 (two) times daily., Disp: 60 tablet, Rfl: 5  PAST MEDICAL HISTORY: Past Medical History:  Diagnosis Date  . DUB (dysfunctional uterine bleeding)   . Graves disease 06/2016  . History of abnormal cervical Pap smear    2011  . History of HPV infection   . Uterine fibroid   . Wears glasses     PAST SURGICAL HISTORY: Past Surgical History:  Procedure Laterality Date  . CESAREAN SECTION N/A 01/06/2018   Procedure: CESAREAN SECTION;  Surgeon: Caren Macadam, MD;  Location: [place];  Service: Obstetrics;  Laterality: N/A;  . EXCISION OF SKIN TAG  11/22/2014   Procedure: EXCISION OF SKIN TAG VULVA;  Surgeon: Governor Specking, MD;  Location: [place];  Service: Gynecology;;  . LAPAROSCOPIC GELPORT ASSISTED MYOMECTOMY N/A 11/22/2014   Procedure: LAPAROSCOPIC GELPORT ASSISTED MYOMECTOMY WITH CHROMOTUBATION;  Surgeon: Governor Specking, MD;  Location: [place];  Service: Gynecology;  Laterality: N/A;  . LAPAROSCOPY N/A 11/22/2014   Procedure: LAPAROSCOPY DIAGNOSTIC;  Surgeon: Governor Specking, MD;  Location: [place];  Service: Gynecology;  Laterality: N/A;  . LEEP  2011  . TONSILLECTOMY AND ADENOIDECTOMY  age 58  . WISDOM TOOTH EXTRACTION  2006    FAMILY HISTORY: Family History  Problem Relation Age of Onset  . Fibroids Mother   . Thyroid disease Neg Hx     SOCIAL HISTORY:  Social History   Socioeconomic History  . Marital status: Single    Spouse name: Not on file  . Number of children: Not on file  . Years of education: Not on file  . Highest education level: Not on file  Occupational History  . Not on file  Social Needs  . Financial resource strain: Not hard at all  . Food insecurity    Worry: Never true    Inability: Never true  . Transportation needs    Medical: No    Non-medical: Not on file  Tobacco Use  . Smoking status: Former Smoker    Packs/day: 0.25    Years: 10.00    Pack years: 2.50     Types: Cigarettes    Quit date: 04/2017    Years since quitting: 1.8  . Smokeless tobacco: Never Used  Substance and Sexual Activity  . Alcohol use: Yes    Alcohol/week: 0.0 standard drinks    Comment: SOCIAL  . Drug use: No  . Sexual activity: Yes    Birth control/protection: None  Lifestyle  . Physical activity    Days per week: Not on file    Minutes per session: Not on file  . Stress: To some extent  Relationships  . Social Herbalist on phone: Not on file    Gets together: Not on file    Attends religious service: Not on file    Active member of club or organization: Not on file    Attends meetings of clubs or organizations: Not on file    Relationship status: Not on file  . Intimate  partner violence    Fear of current or ex partner: No    Emotionally abused: No    Physically abused: No    Forced sexual activity: No  Other Topics Concern  . Not on file  Social History Narrative  . Not on file     PHYSICAL EXAM  Vitals:   02/01/19 1437  BP: (!) 146/97  Pulse: 80  Temp: (!) 97.3 F (36.3 C)  TempSrc: Temporal  Weight: 217 lb 5 oz (98.6 kg)  Height: 5\' 3"  (1.6 m)    Body mass index is 38.5 kg/m.   General: The patient is well-developed and well-nourished and in no acute distress  HEENT:  Head is [place]/AT.  Sclera are anicteric.    Skin: Extremities are without rash or edema.  Neurologic Exam  Mental status: The patient is alert and oriented x 3 at the time of the examination. The patient has apparent normal recent and remote memory, with an apparently normal attention span and concentration ability.   Speech is normal.  Cranial nerves: Extraocular movements are full. . There is good facial sensation to soft touch bilaterally.Facial strength is normal.  Trapezius and sternocleidomastoid strength is normal. No dysarthria is noted. No obvious hearing deficits are noted.  Motor:  Muscle bulk is normal.   Tone is normal. Strength is  5 / 5 in  all 4 extremities.   Sensory: Sensory testing is intact to pinprick, soft touch and vibration sensation in the limbs.  The left groin area had normal sensation to temperature and touch.  Coordination: Cerebellar testing reveals good finger-nose-finger and heel-to-shin bilaterally.  Gait and station: Station is normal.   Gait is normal. Tandem gait is normal. Romberg is negative.   Reflexes: Deep tendon reflexes are symmetric and normal bilaterally.   Plantar responses are flexor.       ASSESSMENT AND PLAN  Dysesthesia  Left inguinal pain  Obesity without serious comorbidity, unspecified classification, unspecified obesity type   In summary, Ms. Maracle is a 38 year old woman with intermittent but severe dysesthesias in the left groin region that started during her pregnancy last year but persisted after delivery.  The episodes have increased in frequency over the past couple months.  Her examination is normal.  Specifically, there was no abnormal sensation along a dermatome or neurotome in the left leg or groin.  The etiology of her symptoms is uncertain.  The distribution of the intermittent pain appears to be along the femoral branch of the genitofemoral nerve.  They could also be in the ilioinguinal nerve distribution.  She only reports weakness in the leg when she has an episode of severe pain in her description is more consistent with the leg giving out due to the pain rather than to a separate neurological issue.  She is overweight and we discussed trying to lose weight.  She might also try wearing the pregnancy band again.  I will have her try lamotrigine to see if that can help the neuropathic pain.  If this is not helpful, we could try gabapentin, a tricyclic agent or oxcarbazepine as other options.  If she fails a couple of oral agents but symptoms persist consider referral to a pain management center for genitofemoral nerve blockade.  She will return to see me as needed based on  her response to treatment.  She is advised to call in 6 weeks if not better.  Thank you for asking me to see Ms. Maggard.  Please let me know if  I can be of further assistance with her or other patients in the future    A. Felecia Shelling, MD, [facility] [id number], 0000000 PM Certified in Neurology, Clinical Neurophysiology, Sleep Medicine and Neuroimaging  [facility] [address] [place], [place] [address] [phone]

## 2019-02-10 ENCOUNTER — Ambulatory Visit: Payer: Managed Care, Other (non HMO) | Admitting: Obstetrics and Gynecology

## 2019-02-17 ENCOUNTER — Ambulatory Visit: Payer: Managed Care, Other (non HMO) | Admitting: Obstetrics

## 2019-02-17 ENCOUNTER — Other Ambulatory Visit: Payer: Self-pay

## 2019-02-17 ENCOUNTER — Other Ambulatory Visit (HOSPITAL_COMMUNITY)
Admission: RE | Admit: 2019-02-17 | Discharge: 2019-02-17 | Disposition: A | Discharge status: Home / Self Care | Payer: Managed Care, Other (non HMO) | Source: Ambulatory Visit | Attending: Obstetrics | Admitting: Obstetrics

## 2019-02-17 VITALS — BP 145/96 | HR 74 | Wt 216.0 lb

## 2019-02-17 DIAGNOSIS — R87612 Low grade squamous intraepithelial lesion on cytologic smear of cervix (LGSIL): Secondary | ICD-10-CM

## 2019-02-17 DIAGNOSIS — Z01812 Encounter for preprocedural laboratory examination: Secondary | ICD-10-CM

## 2019-02-17 NOTE — Progress Notes (Signed)
Colposcopy Procedure Note  Indications: Pap smear 1 months ago showed: low-grade squamous intraepithelial neoplasia (LGSIL - encompassing HPV,mild dysplasia,CIN I) with negative High Risk HPV. The prior pap showed no abnormalities.  Prior cervical/vaginal disease: normal exam without visible pathology. Prior cervical treatment: no treatment    Procedure Details  The risks and benefits of the procedure and Written informed consent obtained.  A time-out was performed confirming the patient, procedure and allergy status  Speculum placed in vagina and excellent visualization of cervix achieved, cervix swabbed x 3 with acetic acid solution.  Findings: Cervix: no visible lesions; SCJ visualized 360 degrees without lesions, endocervical curettage performed, cervical biopsies taken at 6 and 12 o'clock o'clock, specimen labelled and sent to pathology and hemostasis achieved with silver nitrate.   Vaginal inspection: normal without visible lesions. Vulvar colposcopy: vulvar colposcopy not performed.   Physical Exam   Specimens: ECC and Colposcopic directed cervical biopsies  Complications: none.  Plan: Specimens labelled and sent to Pathology. Will base further treatment on Pathology findings.  Shelly Bombard, MD 02/17/2019 5:04 PM.    Return to discuss Pathology results in 1 weeks.

## 2019-02-18 ENCOUNTER — Encounter: Payer: Self-pay | Admitting: Internal Medicine

## 2019-02-21 ENCOUNTER — Telehealth: Payer: Self-pay

## 2019-02-21 NOTE — Telephone Encounter (Signed)
The patient was scheduled a new patient appointment.  The pt has been having problems with her blood pressure.

## 2019-02-24 ENCOUNTER — Other Ambulatory Visit: Payer: Self-pay

## 2019-02-24 ENCOUNTER — Encounter: Payer: Self-pay | Admitting: Obstetrics

## 2019-02-24 ENCOUNTER — Ambulatory Visit: Payer: Managed Care, Other (non HMO) | Admitting: Nurse Practitioner

## 2019-02-24 ENCOUNTER — Encounter: Payer: Self-pay | Admitting: Nurse Practitioner

## 2019-02-24 ENCOUNTER — Telehealth (INDEPENDENT_AMBULATORY_CARE_PROVIDER_SITE_OTHER): Payer: Managed Care, Other (non HMO) | Admitting: Obstetrics

## 2019-02-24 VITALS — BP 126/80 | HR 83 | Temp 97.9°F | Ht 63.0 in | Wt 220.6 lb

## 2019-02-24 DIAGNOSIS — R002 Palpitations: Secondary | ICD-10-CM | POA: Diagnosis not present

## 2019-02-24 DIAGNOSIS — R87612 Low grade squamous intraepithelial lesion on cytologic smear of cervix (LGSIL): Secondary | ICD-10-CM | POA: Diagnosis not present

## 2019-02-24 DIAGNOSIS — E0500 Thyrotoxicosis with diffuse goiter without thyrotoxic crisis or storm: Secondary | ICD-10-CM | POA: Diagnosis not present

## 2019-02-24 LAB — SURGICAL PATHOLOGY

## 2019-02-24 NOTE — Patient Instructions (Signed)
Palpitations Palpitations are feelings that your heartbeat is not normal. Your heartbeat may feel like it is:  Uneven.  Faster than normal.  Fluttering.  Skipping a beat. This is usually not a serious problem. In some cases, you may need tests to rule out any serious problems. Follow these instructions at home: Pay attention to any changes in your condition. Take these actions to help manage your symptoms: Eating and drinking  Avoid: ? Coffee, tea, soft drinks, and energy drinks. ? Chocolate. ? Alcohol. ? Diet pills. Lifestyle   Try to lower your stress. These things can help you relax: ? Yoga. ? Deep breathing and meditation. ? Exercise. ? Using words and images to create positive thoughts (guided imagery). ? Using your mind to control things in your body (biofeedback).  Do not use drugs.  Get plenty of rest and sleep. Keep a regular bed time. General instructions   Take over-the-counter and prescription medicines only as told by your doctor.  Do not use any products that contain nicotine or tobacco, such as cigarettes and e-cigarettes. If you need help quitting, ask your doctor.  Keep all follow-up visits as told by your doctor. This is important. You may need more tests if palpitations do not go away or get worse. Contact a doctor if:  Your symptoms last more than 24 hours.  Your symptoms occur more often. Get help right away if you:  Have chest pain.  Feel short of breath.  Have a very bad headache.  Feel dizzy.  Pass out (faint). Summary  Palpitations are feelings that your heartbeat is uneven or faster than normal. It may feel like your heart is fluttering or skipping a beat.  Avoid food and drinks that may cause palpitations. These include caffeine, chocolate, and alcohol.  Try to lower your stress. Do not smoke or use drugs.  Get help right away if you faint or have chest pain, shortness of breath, a severe headache, or dizziness. This  information is not intended to replace advice given to you by your health care provider. Make sure you discuss any questions you have with your health care provider. Document Released: 12/04/2007 Document Revised: 04/08/2017 Document Reviewed: 04/08/2017 Elsevier Patient Education  2020 Elsevier Inc.  

## 2019-02-24 NOTE — Progress Notes (Signed)
This visit occurred during the SARS-CoV-2 public health emergency.  Safety protocols were in place, including screening questions prior to the visit, additional usage of staff PPE, and extensive cleaning of exam room while observing appropriate contact time as indicated for disinfecting solutions.  Subjective:     Patient ID: Krista Hogan , female    DOB: 1980-07-15 , 38 y.o.   MRN: [id number]   Chief Complaint  Patient presents with  . Establish Care    patient stated she went to her obgyn and her blood pressure was high and she also stated she feels like her heart is skipping a beat    HPI  Here to establish care she had been a patient here previously with Dr. Baird Cancer.  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  Single.  One child - healthy.    PMH - fibroid removal 2016, gestational diabetes when she was pregnant - 38 year old.  She has been having problems with the baby lying on her nerves - she was started on Lamictal but has not been taking due to the potential side effects.  Graves disease - Dr. Renne Crigler, seen this year - 2018.    She went to the gynecologist and had her blood pressure checked was elevated. She thinks 145/90 twice.  She will also have "palpitations or flutters".  Has been occurring for a couple months.  She will eat out several times a week and will eat boxed meals.  She will drink approximately 2 bottles of water a day.  She does drink coffee and ginger ale.  She says she is "pretty stressed".  She is not exercising regularly.  Symptoms occur intermittently mostly when she is sitting.  She is voiding regularly.   [facility] - maternal grandmother - diabetes.  She is an only child.   Palpitations  This is a new problem. The current episode started more than 1 month ago. The problem occurs intermittently. The problem has been waxing and waning. The symptoms are aggravated by caffeine and stress. Pertinent negatives include no chest pain, dizziness,  shortness of breath or syncope.     Past Medical History:  Diagnosis Date  . DUB (dysfunctional uterine bleeding)   . Graves disease 06/2016  . History of abnormal cervical Pap smear    2011  . History of HPV infection   . Uterine fibroid   . Wears glasses      Family History  Problem Relation Age of Onset  . Fibroids Mother   . Thyroid disease Neg Hx      Current Outpatient Medications:  .  acetaminophen (TYLENOL) 500 MG tablet, Take 500 mg by mouth every 6 (six) hours as needed for moderate pain., Disp: , Rfl:  .  cetirizine (ZYRTEC) 10 MG tablet, Take 10 mg by mouth daily., Disp: , Rfl:  .  cholecalciferol (VITAMIN D) 1000 units tablet, Take 1,000 Units by mouth daily., Disp: , Rfl:  .  cyanocobalamin 1000 MCG tablet, Take 1,000 mcg by mouth daily., Disp: , Rfl:  .  ibuprofen (ADVIL) 600 MG tablet, Take 1 tablet (600 mg total) by mouth every 6 (six) hours., Disp: 60 tablet, Rfl: 1 .  lamoTRIgine (LAMICTAL) 100 MG tablet, Take 1 tablet (100 mg total) by mouth 2 (two) times daily., Disp: 60 tablet, Rfl: 5 .  norethindrone-ethinyl estradiol 1/35 ([place] 1/35, 28,) tablet, Take 1 tablet by mouth daily., Disp: 1 Package, Rfl: 11 .  Omega-3 Fatty  Acids (FISH OIL) 1000 MG CAPS, Take 1,000 mg by mouth daily. , Disp: , Rfl:    No Known Allergies   Review of Systems  Constitutional: Negative.   Respiratory: Negative.  Negative for chest tightness, shortness of breath and wheezing.   Cardiovascular: Positive for palpitations. Negative for chest pain, leg swelling and syncope.  Endocrine: Negative for polydipsia, polyphagia and polyuria.  Neurological: Negative for dizziness and headaches.  Psychiatric/Behavioral: Negative.      Today's Vitals   02/24/19 1559  BP: 126/80  Pulse: 83  Temp: 97.9 F (36.6 C)  TempSrc: Oral  Weight: 220 lb 9.6 oz (100.1 kg)  Height: '5\' 3"'  (1.6 m)  PainSc: 0-No pain   Body mass index is 39.08 kg/m.   Objective:  Physical  Exam Constitutional:      General: She is not in acute distress.    Appearance: Normal appearance.  Cardiovascular:     Rate and Rhythm: Normal rate and regular rhythm.     Pulses: Normal pulses.     Heart sounds: Normal heart sounds. No murmur.  Pulmonary:     Effort: Pulmonary effort is normal. No respiratory distress.     Breath sounds: Normal breath sounds.  Skin:    Capillary Refill: Capillary refill takes less than 2 seconds.  Neurological:     General: No focal deficit present.     Mental Status: She is alert and oriented to person, place, and time.  Psychiatric:        Mood and Affect: Mood normal.        Behavior: Behavior normal.        Thought Content: Thought content normal.        Judgment: Judgment normal.         Assessment And Plan:     1. Palpitations  Intermittent episodes of palpitations  EKG revealed NSR  Will check for volume, and metabolic labs  Encouraged to limit intake of caffeine   Also discussed how stress can cause palpitations  Pending labs will consider referral to cardiology  Also discussed this can be related to her thyroid - EKG 12-Lead - T3, free - TSH - T4, Free - CBC no Diff - CMP14+EGFR  2. Graves disease  She was diagnosed in 2017 seen by Dr Lissa Merlin and in 2018 began seeing Dr. Renne Crigler. After reading note she has been on methimazole and PTU in the past, no medications currently. She was due for labs this month with Dr Renne Crigler but did not have yet, done here and shared with Dr. Renne Crigler.     Minette Brine, FNP    THE PATIENT IS ENCOURAGED TO PRACTICE SOCIAL DISTANCING DUE TO THE COVID-19 PANDEMIC.

## 2019-02-24 NOTE — Progress Notes (Signed)
    TELEHEALTH GYNECOLOGY VIRTUAL VIDEO VISIT ENCOUNTER NOTE G Provider location: [facility] [place]   I connected with Krista Hogan on 02/24/19 at  9:45 AM EST by MyChart Video Encounter at home and verified that I am speaking with the correct person using two identifiers.   I discussed the limitations, risks, security and privacy concerns of performing an evaluation and management service virtually and the availability of in person appointments. I also discussed with the patient that there may be a patient responsible charge related to this service. The patient expressed understanding and agreed to proceed.   History:  Krista Hogan is a 37 y.o. [id number] female being evaluated today for abnormal pap smear. She denies any abnormal vaginal discharge, bleeding, pelvic pain or other concerns.       Past Medical History:  Diagnosis Date  . DUB (dysfunctional uterine bleeding)   . Graves disease 06/2016  . History of abnormal cervical Pap smear    2011  . History of HPV infection   . Uterine fibroid   . Wears glasses    Past Surgical History:  Procedure Laterality Date  . CESAREAN SECTION N/A 01/06/2018   Procedure: CESAREAN SECTION;  Surgeon: Caren Macadam, MD;  Location: [place];  Service: Obstetrics;  Laterality: N/A;  . EXCISION OF SKIN TAG  11/22/2014   Procedure: EXCISION OF SKIN TAG VULVA;  Surgeon: Governor Specking, MD;  Location: [place];  Service: Gynecology;;  . LAPAROSCOPIC GELPORT ASSISTED MYOMECTOMY N/A 11/22/2014   Procedure: LAPAROSCOPIC GELPORT ASSISTED MYOMECTOMY WITH CHROMOTUBATION;  Surgeon: Governor Specking, MD;  Location: [place];  Service: Gynecology;  Laterality: N/A;  . LAPAROSCOPY N/A 11/22/2014   Procedure: LAPAROSCOPY DIAGNOSTIC;  Surgeon: Governor Specking, MD;  Location: [place];  Service: Gynecology;  Laterality: N/A;  . LEEP  2011  . TONSILLECTOMY AND  ADENOIDECTOMY  age 60  . [place] EXTRACTION  2006   The following portions of the patient's history were reviewed and updated as appropriate: allergies, current medications, past family history, past medical history, past social history, past surgical history and problem list.    Review of Systems:  Pertinent items noted in HPI and remainder of comprehensive ROS otherwise negative.  Physical Exam:   General:  Alert, oriented and cooperative. Patient appears to be in no acute distress.  Mental Status: Normal mood and affect. Normal behavior. Normal judgment and thought content.   Respiratory: Normal respiratory effort, no problems with respiration noted  Rest of physical exam deferred due to type of encounter  Labs and Imaging No results found for this or any previous visit (from the past 336 hour(s)). No results found.     Assessment and Plan:     1. LGSIL on Pap smear of cervix - colposcopy done but results are pending - will reschedule patient's appointment for results       I discussed the assessment and treatment plan with the patient. The patient was provided an opportunity to ask questions and all were answered. The patient agreed with the plan and demonstrated an understanding of the instructions.   The patient was advised to call back or seek an in-person evaluation/go to the ED if the symptoms worsen or if the condition fails to improve as anticipated.  I provided 10 minutes of face-to-face time during this encounter.   Baltazar Najjar, MD [facility] 02/24/2019

## 2019-02-25 ENCOUNTER — Other Ambulatory Visit: Payer: Self-pay | Admitting: Internal Medicine

## 2019-02-25 DIAGNOSIS — R002 Palpitations: Secondary | ICD-10-CM | POA: Insufficient documentation

## 2019-02-25 DIAGNOSIS — E0500 Thyrotoxicosis with diffuse goiter without thyrotoxic crisis or storm: Secondary | ICD-10-CM

## 2019-02-25 HISTORY — DX: Palpitations: R00.2

## 2019-02-25 LAB — CMP14+EGFR
ALT: 26 IU/L (ref 0–32)
AST: 26 IU/L (ref 0–40)
Albumin/Globulin Ratio: 1.4 (ref 1.2–2.2)
Albumin: 4.2 g/dL (ref 3.8–4.8)
Alkaline Phosphatase: 86 IU/L (ref 39–117)
BUN/Creatinine Ratio: 19 (ref 9–23)
BUN: 15 mg/dL (ref 6–20)
Bilirubin Total: 0.2 mg/dL (ref 0.0–1.2)
CO2: 24 mmol/L (ref 20–29)
Calcium: 9.3 mg/dL (ref 8.7–10.2)
Chloride: 103 mmol/L (ref 96–106)
Creatinine, Ser: 0.81 mg/dL (ref 0.57–1.00)
GFR calc Af Amer: 107 mL/min/{1.73_m2} (ref >59)
GFR calc non Af Amer: 93 mL/min/{1.73_m2} (ref >59)
Globulin, Total: 2.9 g/dL (ref 1.5–4.5)
Glucose: 66 mg/dL (ref 65–99)
Potassium: 4.5 mmol/L (ref 3.5–5.2)
Sodium: 140 mmol/L (ref 134–144)
Total Protein: 7.1 g/dL (ref 6.0–8.5)

## 2019-02-25 LAB — CBC
Hematocrit: 41.8 % (ref 34.0–46.6)
Hemoglobin: 14.6 g/dL (ref 11.1–15.9)
MCH: 32.6 pg (ref 26.6–33.0)
MCHC: 34.9 g/dL (ref 31.5–35.7)
MCV: 93 fL (ref 79–97)
Platelets: 380 10*3/uL (ref 150–450)
RBC: 4.48 x10E6/uL (ref 3.77–5.28)
RDW: 12.5 % (ref 11.7–15.4)
WBC: 8.8 10*3/uL (ref 3.4–10.8)

## 2019-02-25 LAB — T4, FREE: Free T4: 1.24 ng/dL (ref 0.82–1.77)

## 2019-02-25 LAB — TSH: TSH: 0.445 u[IU]/mL — ABNORMAL LOW (ref 0.450–4.500)

## 2019-02-25 LAB — T3, FREE: T3, Free: 3.5 pg/mL (ref 2.0–4.4)

## 2019-02-25 MED ORDER — METHIMAZOLE 5 MG PO TABS: 2.5000 mg | ORAL_TABLET | Freq: Every day | ORAL | 3 refills | Status: DC

## 2019-02-28 ENCOUNTER — Ambulatory Visit: Payer: Managed Care, Other (non HMO) | Admitting: Obstetrics and Gynecology

## 2019-02-28 ENCOUNTER — Other Ambulatory Visit: Payer: Self-pay | Admitting: Obstetrics

## 2019-02-28 DIAGNOSIS — Z30011 Encounter for initial prescription of contraceptive pills: Secondary | ICD-10-CM

## 2019-03-14 ENCOUNTER — Ambulatory Visit: Payer: Managed Care, Other (non HMO) | Admitting: Obstetrics and Gynecology

## 2019-04-04 ENCOUNTER — Other Ambulatory Visit: Payer: Self-pay | Admitting: Obstetrics

## 2019-04-04 DIAGNOSIS — Z30011 Encounter for initial prescription of contraceptive pills: Secondary | ICD-10-CM

## 2019-04-05 ENCOUNTER — Other Ambulatory Visit: Payer: Self-pay | Admitting: Obstetrics

## 2019-04-05 DIAGNOSIS — K5901 Slow transit constipation: Secondary | ICD-10-CM

## 2019-04-05 MED ORDER — DOCUSATE SODIUM 100 MG PO CAPS: 100.0000 mg | ORAL_CAPSULE | Freq: Two times a day (BID) | ORAL | 11 refills | Status: DC

## 2019-04-05 MED ORDER — POLYETHYLENE GLYCOL 3350 17 G PO PACK: 17.0000 g | PACK | Freq: Every day | ORAL | 11 refills | Status: DC

## 2019-04-07 ENCOUNTER — Ambulatory Visit: Payer: Managed Care, Other (non HMO) | Admitting: Nurse Practitioner

## 2019-04-08 ENCOUNTER — Telehealth: Payer: Self-pay

## 2019-04-08 ENCOUNTER — Other Ambulatory Visit: Payer: Self-pay | Admitting: Obstetrics

## 2019-04-08 DIAGNOSIS — Z30011 Encounter for initial prescription of contraceptive pills: Secondary | ICD-10-CM

## 2019-04-08 NOTE — Telephone Encounter (Signed)
TC from pt requesting refill on Birth control Pt then asked if birth control can be changed due to bleeding longer with periods. Cycles have been 10 days.  Flow is moderate no clots , notes cramps and takes ibuprofen as well .  Please advise.

## 2019-04-11 ENCOUNTER — Other Ambulatory Visit: Payer: Self-pay | Admitting: Obstetrics

## 2019-04-11 DIAGNOSIS — Z3041 Encounter for surveillance of contraceptive pills: Secondary | ICD-10-CM

## 2019-04-11 MED ORDER — LO LOESTRIN FE 1 MG-10 MCG / 10 MCG PO TABS: 1.0000 | ORAL_TABLET | Freq: Every day | ORAL | 4 refills | Status: DC

## 2019-05-04 ENCOUNTER — Other Ambulatory Visit: Payer: Self-pay | Admitting: Obstetrics

## 2019-05-04 DIAGNOSIS — Z30011 Encounter for initial prescription of contraceptive pills: Secondary | ICD-10-CM

## 2019-05-28 ENCOUNTER — Other Ambulatory Visit: Payer: Self-pay | Admitting: Obstetrics

## 2019-05-28 DIAGNOSIS — Z30011 Encounter for initial prescription of contraceptive pills: Secondary | ICD-10-CM

## 2019-05-30 ENCOUNTER — Ambulatory Visit: Payer: Managed Care, Other (non HMO) | Admitting: Physician Assistant

## 2019-06-16 ENCOUNTER — Ambulatory Visit: Payer: Managed Care, Other (non HMO) | Admitting: Physician Assistant

## 2019-06-22 ENCOUNTER — Telehealth: Payer: Self-pay | Admitting: Physician Assistant

## 2019-06-22 NOTE — Telephone Encounter (Signed)
Patient is calling to say that the medication that she is using at this time is not helping the rash on her neck. (She does not remember the name of the medication that she is currently using).   It seems that the rash may be spreading.  What can she do to relieve the irritation?  She uses Paediatric nurse on [address].   Chart # [id number]

## 2019-06-23 NOTE — Telephone Encounter (Signed)
Patient left message stating she has not heard from our office regarding the rash on neck.

## 2019-06-23 NOTE — Telephone Encounter (Signed)
Call to see how she is doing> whelps?

## 2019-06-23 NOTE — Telephone Encounter (Signed)
Please call Dominique

## 2019-06-23 NOTE — Telephone Encounter (Signed)
Phone call to patient per [facility] request to see how see's doing.  Per patient the rash is still around her neck it's itching and red, patient states that she is using OTC cortisone cream and blue star.  Patient states that she finished the prednisone over two weeks ago and that did clear up her full body rash but not her neck.  Patient also states that she went and got the COVID vaccine one week ago.  I informed patient that I would give this message to [facility] and see what the next step is.  Patient okay with that.

## 2019-06-24 NOTE — Telephone Encounter (Signed)
Where's the message?

## 2019-07-01 ENCOUNTER — Other Ambulatory Visit: Payer: Self-pay | Admitting: Obstetrics

## 2019-07-01 DIAGNOSIS — Z30011 Encounter for initial prescription of contraceptive pills: Secondary | ICD-10-CM

## 2019-07-01 NOTE — Telephone Encounter (Signed)
Patient is called again and left office voice mail saying that she still has not heard from anyone about the allergic reaction that patient is having.  [place] also stated that she has other questions for [facility], PA-C as well.  Chart # [id number]

## 2019-07-02 NOTE — Telephone Encounter (Signed)
Patient has prolonged and heavy periods on Norethindrone.  Please call her and discuss contraceptive options

## 2019-07-05 ENCOUNTER — Other Ambulatory Visit: Payer: Self-pay | Admitting: Obstetrics

## 2019-07-05 ENCOUNTER — Telehealth: Payer: Self-pay | Admitting: *Deleted

## 2019-07-05 ENCOUNTER — Telehealth: Payer: Self-pay | Admitting: Physician Assistant

## 2019-07-05 DIAGNOSIS — Z30011 Encounter for initial prescription of contraceptive pills: Secondary | ICD-10-CM

## 2019-07-05 MED ORDER — NORETHINDRONE 0.35 MG PO TABS: 1.0000 | ORAL_TABLET | Freq: Every day | ORAL | 11 refills | Status: DC

## 2019-07-05 NOTE — Telephone Encounter (Signed)
Micronor Rx

## 2019-07-05 NOTE — Telephone Encounter (Signed)
Refill request from pharmacy for Norethindrone .35mg . Pt states she is currently taking this Rx and not on LoLo estrin.    Please send refills if approved.

## 2019-07-12 ENCOUNTER — Other Ambulatory Visit: Payer: Self-pay

## 2019-07-12 ENCOUNTER — Ambulatory Visit: Payer: Managed Care, Other (non HMO) | Admitting: Nurse Practitioner

## 2019-07-12 ENCOUNTER — Encounter: Payer: Self-pay | Admitting: Nurse Practitioner

## 2019-07-12 VITALS — BP 130/76 | HR 74 | Temp 98.1°F | Ht 64.6 in | Wt 227.2 lb

## 2019-07-12 DIAGNOSIS — B351 Tinea unguium: Secondary | ICD-10-CM | POA: Diagnosis not present

## 2019-07-12 DIAGNOSIS — R202 Paresthesia of skin: Secondary | ICD-10-CM

## 2019-07-12 DIAGNOSIS — G629 Polyneuropathy, unspecified: Secondary | ICD-10-CM

## 2019-07-12 DIAGNOSIS — J029 Acute pharyngitis, unspecified: Secondary | ICD-10-CM

## 2019-07-12 MED ORDER — TOLNAFTATE 1 % EX AERP: INHALATION_SPRAY | Freq: Two times a day (BID) | CUTANEOUS | 0 refills | Status: DC

## 2019-07-12 NOTE — Progress Notes (Signed)
This visit occurred during the SARS-CoV-2 public health emergency.  Safety protocols were in place, including screening questions prior to the visit, additional usage of staff PPE, and extensive cleaning of exam room while observing appropriate contact time as indicated for disinfecting solutions.  Subjective:     Patient ID: Krista Hogan , female    DOB: September 19, 1980 , 39 y.o.   MRN: [id number]   Chief Complaint  Patient presents with  . Leg Pain  . Tinea Pedis  . Allergic Reaction    HPI  She is just having pain, she had her child January 06, 2018.  She did not feel like the provider was listening. She has seen Dr. Felecia Hogan in the past and does not want to return.  No physical therapy, denies back pain  She was taking minocycline from the dermatologist - had a break out in hives. Occurred after a week, she took prednisone to help with the rash.  She is complaining of throat pain. She is using hydrocortisone cream.  She is taking zyrtec daily.    Leg Pain  The incident occurred more than 1 week ago (when she was pregnant initially started, she has seen the neurologist, sharp and stinging pain). There was no injury mechanism. Pain location: bilateral shooting pains. The quality of the pain is described as shooting and stabbing. Pain scale: when she has the pain will be "off the charts" The pain is severe. The pain has been intermittent since onset. Associated symptoms include tingling. Pertinent negatives include no inability to bear weight or numbness. She has tried nothing for the symptoms.  Allergic Reaction Pertinent negatives include no chest pain.     Past Medical History:  Diagnosis Date  . DUB (dysfunctional uterine bleeding)   . Graves disease 06/2016  . History of abnormal cervical Pap smear    2011  . History of HPV infection   . Uterine fibroid   . Wears glasses      Family History  Problem Relation Age of Onset  . Fibroids Mother   . Thyroid disease Neg Hx       Current Outpatient Medications:  .  acetaminophen (TYLENOL) 500 MG tablet, Take 500 mg by mouth every 6 (six) hours as needed for moderate pain., Disp: , Rfl:  .  cetirizine (ZYRTEC) 10 MG tablet, Take 10 mg by mouth daily., Disp: , Rfl:  .  cholecalciferol (VITAMIN D) 1000 units tablet, Take 1,000 Units by mouth daily., Disp: , Rfl:  .  cyanocobalamin 1000 MCG tablet, Take 1,000 mcg by mouth daily., Disp: , Rfl:  .  docusate sodium (COLACE) 100 MG capsule, Take 1 capsule (100 mg total) by mouth 2 (two) times daily., Disp: 60 capsule, Rfl: 11 .  ibuprofen (ADVIL) 600 MG tablet, Take 1 tablet (600 mg total) by mouth every 6 (six) hours., Disp: 60 tablet, Rfl: 1 .  norethindrone (MICRONOR) 0.35 MG tablet, Take 1 tablet (0.35 mg total) by mouth daily., Disp: 28 tablet, Rfl: 11 .  Omega-3 Fatty Acids (FISH OIL) 1000 MG CAPS, Take 1,000 mg by mouth daily. , Disp: , Rfl:  .  LO LOESTRIN FE 1 MG-10 MCG / 10 MCG tablet, Take 1 tablet by mouth daily. (Patient not taking: Reported on 07/12/2019), Disp: 3 Package, Rfl: 4 .  methimazole (TAPAZOLE) 5 MG tablet, Take 0.5 tablets (2.5 mg total) by mouth daily. (Patient not taking: Reported on 07/12/2019), Disp: 45 tablet, Rfl: 3 .  polyethylene glycol (MIRALAX) 17 g packet, Take  17 g by mouth daily. (Patient not taking: Reported on 07/12/2019), Disp: 30 each, Rfl: 11   Allergies  Allergen Reactions  . Minocycline Rash     Review of Systems  Constitutional: Negative.   Respiratory: Negative.   Cardiovascular: Negative.  Negative for chest pain, palpitations and leg swelling.  Neurological: Positive for tingling. Negative for dizziness and numbness.  Psychiatric/Behavioral: Negative.      Today's Vitals   07/12/19 1623  BP: 130/76  Pulse: 74  Temp: 98.1 F (36.7 C)  TempSrc: Oral  Weight: 227 lb 3.2 oz (103.1 kg)  Height: 5' 4.6" (1.641 m)   Body mass index is 38.28 kg/m.   Objective:  Physical Exam Constitutional:      General: She is not  in acute distress.    Appearance: Normal appearance.  Cardiovascular:     Rate and Rhythm: Normal rate and regular rhythm.     Pulses: Normal pulses.     Heart sounds: Normal heart sounds. No murmur.  Pulmonary:     Effort: Pulmonary effort is normal. No respiratory distress.     Breath sounds: Normal breath sounds.  Musculoskeletal:        General: No swelling or tenderness.  Skin:    Capillary Refill: Capillary refill takes less than 2 seconds.     Findings: Erythema present.     Comments: Normal sensation bilateral feet.  Yellow discoloration to toenails bilateral feet   Neurological:     General: No focal deficit present.     Mental Status: She is alert and oriented to person, place, and time.  Psychiatric:        Mood and Affect: Mood normal.        Behavior: Behavior normal.        Thought Content: Thought content normal.        Judgment: Judgment normal.         Assessment And Plan:     1. Neuropathy  Reports a long history of neuropathy which is worsening  2. Paresthesia  She has been having these symptoms since having her child  Will refer her to Neurology for EMG/NCV  3. Paresthesia of both legs  Will refer for EMG/NCV  Sensation is normal bilaterally - Ambulatory referral to Neurology  4. Onychomycosis of toenail  Yellow discoloration to her toenails, will treat with tinactin spray over the counter - tolnaftate (TINACTIN) 1 % spray; Apply topically 2 (two) times daily.  Dispense: 130 g; Refill: 0  5. Pharyngitis, unspecified etiology  Tonsils appear normal   Since has been persistent will refer to ENT - Ambulatory referral to ENT   Krista Brine, FNP    THE PATIENT IS ENCOURAGED TO PRACTICE SOCIAL DISTANCING DUE TO THE COVID-19 PANDEMIC.

## 2019-08-02 ENCOUNTER — Ambulatory Visit: Payer: Managed Care, Other (non HMO) | Admitting: Obstetrics

## 2019-08-02 ENCOUNTER — Other Ambulatory Visit: Payer: Self-pay

## 2019-08-02 ENCOUNTER — Encounter: Payer: Self-pay | Admitting: Obstetrics

## 2019-08-02 ENCOUNTER — Other Ambulatory Visit (HOSPITAL_COMMUNITY)
Admission: RE | Admit: 2019-08-02 | Discharge: 2019-08-02 | Disposition: A | Discharge status: Home / Self Care | Payer: Managed Care, Other (non HMO) | Source: Ambulatory Visit | Attending: Obstetrics | Admitting: Obstetrics

## 2019-08-02 VITALS — BP 127/90 | HR 65 | Wt 227.5 lb

## 2019-08-02 DIAGNOSIS — N898 Other specified noninflammatory disorders of vagina: Secondary | ICD-10-CM | POA: Diagnosis present

## 2019-08-02 DIAGNOSIS — N939 Abnormal uterine and vaginal bleeding, unspecified: Secondary | ICD-10-CM

## 2019-08-02 DIAGNOSIS — Z3009 Encounter for other general counseling and advice on contraception: Secondary | ICD-10-CM

## 2019-08-02 DIAGNOSIS — N944 Primary dysmenorrhea: Secondary | ICD-10-CM

## 2019-08-02 DIAGNOSIS — J301 Allergic rhinitis due to pollen: Secondary | ICD-10-CM

## 2019-08-02 DIAGNOSIS — Z113 Encounter for screening for infections with a predominantly sexual mode of transmission: Secondary | ICD-10-CM

## 2019-08-02 DIAGNOSIS — D251 Intramural leiomyoma of uterus: Secondary | ICD-10-CM

## 2019-08-02 DIAGNOSIS — Z30011 Encounter for initial prescription of contraceptive pills: Secondary | ICD-10-CM

## 2019-08-02 DIAGNOSIS — A630 Anogenital (venereal) warts: Secondary | ICD-10-CM | POA: Diagnosis not present

## 2019-08-02 MED ORDER — IBUPROFEN 800 MG PO TABS: 800.0000 mg | ORAL_TABLET | Freq: Three times a day (TID) | ORAL | 5 refills | Status: DC | PRN

## 2019-08-02 MED ORDER — ORTHO-NOVUM 1/35 (28) 1-35 MG-MCG PO TABS: 1.0000 | ORAL_TABLET | Freq: Every day | ORAL | 11 refills | Status: DC

## 2019-08-02 MED ORDER — CETIRIZINE HCL 10 MG PO TABS: 10.0000 mg | ORAL_TABLET | Freq: Every day | ORAL | 11 refills | Status: DC

## 2019-08-02 NOTE — Progress Notes (Signed)
Patient ID: Krista Hogan, female   DOB: [birth year], 39 y.o.   MRN: [id number]  Chief Complaint  Patient presents with  . AUB    HPI Krista Hogan is a 39 y.o. female.  Complains of vaginal spotting, brownish.  Taking Micronor while breastfeeding, but has recently stopped breast feeding.  Also has observed a few small genital warts on vulva-left side. HPI  Past Medical History:  Diagnosis Date  . DUB (dysfunctional uterine bleeding)   . Graves disease 06/2016  . History of abnormal cervical Pap smear    2011  . History of HPV infection   . Uterine fibroid   . Wears glasses     Past Surgical History:  Procedure Laterality Date  . CESAREAN SECTION N/A 01/06/2018   Procedure: CESAREAN SECTION;  Surgeon: Caren Macadam, MD;  Location: [place];  Service: Obstetrics;  Laterality: N/A;  . EXCISION OF SKIN TAG  11/22/2014   Procedure: EXCISION OF SKIN TAG VULVA;  Surgeon: Governor Specking, MD;  Location: [place];  Service: Gynecology;;  . LAPAROSCOPIC GELPORT ASSISTED MYOMECTOMY N/A 11/22/2014   Procedure: LAPAROSCOPIC GELPORT ASSISTED MYOMECTOMY WITH CHROMOTUBATION;  Surgeon: Governor Specking, MD;  Location: [place];  Service: Gynecology;  Laterality: N/A;  . LAPAROSCOPY N/A 11/22/2014   Procedure: LAPAROSCOPY DIAGNOSTIC;  Surgeon: Governor Specking, MD;  Location: [place];  Service: Gynecology;  Laterality: N/A;  . LEEP  2011  . TONSILLECTOMY AND ADENOIDECTOMY  age 60  . WISDOM TOOTH EXTRACTION  2006    Family History  Problem Relation Age of Onset  . Fibroids Mother   . Thyroid disease Neg Hx     Social History Social History   Tobacco Use  . Smoking status: Former Smoker    Packs/day: 0.25    Years: 10.00    Pack years: 2.50    Types: Cigarettes    Quit date: 04/2017    Years since quitting: 2.3  . Smokeless tobacco: Never Used  Substance Use Topics  . Alcohol use: Yes    Alcohol/week: 0.0  standard drinks    Comment: SOCIAL  . Drug use: No    Allergies  Allergen Reactions  . Minocycline Rash    Current Outpatient Medications  Medication Sig Dispense Refill  . acetaminophen (TYLENOL) 500 MG tablet Take 500 mg by mouth every 6 (six) hours as needed for moderate pain.    . cetirizine (ZYRTEC) 10 MG tablet Take 1 tablet (10 mg total) by mouth daily. 30 tablet 11  . cholecalciferol (VITAMIN D) 1000 units tablet Take 1,000 Units by mouth daily.    . cyanocobalamin 1000 MCG tablet Take 1,000 mcg by mouth daily.    Marland Kitchen docusate sodium (COLACE) 100 MG capsule Take 1 capsule (100 mg total) by mouth 2 (two) times daily. (Patient not taking: Reported on 08/02/2019) 60 capsule 11  . ibuprofen (ADVIL) 600 MG tablet Take 1 tablet (600 mg total) by mouth every 6 (six) hours. (Patient not taking: Reported on 08/02/2019) 60 tablet 1  . ibuprofen (ADVIL) 800 MG tablet Take 1 tablet (800 mg total) by mouth every 8 (eight) hours as needed. 30 tablet 5  . methimazole (TAPAZOLE) 5 MG tablet Take 0.5 tablets (2.5 mg total) by mouth daily. (Patient not taking: Reported on 07/12/2019) 45 tablet 3  . norethindrone-ethinyl estradiol 1/35 ([place] 1/35, 28,) tablet Take 1 tablet by mouth daily. 1 Package 11  . Omega-3 Fatty Acids (FISH OIL) 1000 MG CAPS  Take 1,000 mg by mouth daily.     . polyethylene glycol (MIRALAX) 17 g packet Take 17 g by mouth daily. (Patient not taking: Reported on 07/12/2019) 30 each 11  . tolnaftate (TINACTIN) 1 % spray Apply topically 2 (two) times daily. (Patient not taking: Reported on 08/02/2019) 130 g 0   No current facility-administered medications for this visit.    Review of Systems Review of Systems Constitutional: negative for fatigue and weight loss Respiratory: negative for cough and wheezing Cardiovascular: negative for chest pain, fatigue and palpitations Gastrointestinal: negative for abdominal pain and change in bowel habits Genitourinary:positive for irregular  vaginal spotting Integument/breast: negative for nipple discharge Musculoskeletal:negative for myalgias Neurological: negative for gait problems and tremors Behavioral/Psych: negative for abusive relationship, depression Endocrine: negative for temperature intolerance      Blood pressure 127/90, pulse 65, weight 227 lb 8 oz (103.2 kg), last menstrual period 07/15/2019, currently breastfeeding.  Physical Exam Physical Exam General:   alert and no distress  Skin:   no rash or abnormalities  Lungs:   clear to auscultation bilaterally  Heart:   regular rate and rhythm, S1, S2 normal, no murmur, click, rub or gallop  Breasts:   normal without suspicious masses, skin or nipple changes or axillary nodes  Abdomen:  normal findings: no organomegaly, soft, non-tender and no hernia  Pelvis:  External genitalia: small wart left vulva Urinary system: urethral meatus normal and bladder without fullness, nontender Vaginal: normal without tenderness, induration or masses Cervix: normal appearance Adnexa: normal bimanual exam Uterus: anteverted and non-tender, normal size     TCA 123XX123 APPLICATION PROCEDURE: Patient identified, informed consent signed and copy in chart, time out performed.    Areas of typical appearing genital warts noted on left vulva.  Surrounding area coated with water based lubricant and TCA applied until warts had white appearance.   Patient tolerated the procedure well.    Post procedure instructions given and patient told to wash area in thirty minutes.  Return in 1 week for next treatment.  Shelly Bombard, MD  08/02/2019    50% of 25 min visit spent on counseling and coordination of care.   Data Reviewed Labs  Assessment     1. Abnormal uterine bleeding (AUB)  2. Fibroids, intramural Rx: - US PELVIC COMPLETE WITH TRANSVAGINAL; Future  3. Vaginal discharge Rx: - Cervicovaginal ancillary only( [place])  4. Screen for STD (sexually transmitted  disease) Rx: - Hepatitis B surface antigen - Hepatitis C antibody - HIV Antibody (routine testing w rflx) - RPR  5. Primary dysmenorrhea Rx: - ibuprofen (ADVIL) 800 MG tablet; Take 1 tablet (800 mg total) by mouth every 8 (eight) hours as needed.  Dispense: 30 tablet; Refill: 5  6. Genital warts - TCA 80% applied ( see procedure note )  7. Encounter for other general counseling and advice on contraception - wants OCP's  35. Encounter for initial prescription of contraceptive pills Rx: - norethindrone-ethinyl estradiol 1/35 ([place] 1/35, 28,) tablet; Take 1 tablet by mouth daily.  Dispense: 1 Package; Refill: 11  9. Seasonal allergic rhinitis due to pollen Rx: - cetirizine (ZYRTEC) 10 MG tablet; Take 1 tablet (10 mg total) by mouth daily.  Dispense: 30 tablet; Refill: 11    Plan  Follow up in November for Annual   Orders Placed This Encounter  Procedures  . US PELVIC COMPLETE WITH TRANSVAGINAL    Standing Status:   Future    Standing Expiration Date:   08/01/2020  Order Specific Question:   Reason for Exam (SYMPTOM  OR DIAGNOSIS REQUIRED)    Answer:   AUB.  Follow up fibroids.  S/P myomectomy in ~ 2016.    Order Specific Question:   Preferred imaging location?    Answer:   [facility]  . Hepatitis B surface antigen  . Hepatitis C antibody  . HIV Antibody (routine testing w rflx)  . RPR   Meds ordered this encounter  Medications  . cetirizine (ZYRTEC) 10 MG tablet    Sig: Take 1 tablet (10 mg total) by mouth daily.    Dispense:  30 tablet    Refill:  11  . norethindrone-ethinyl estradiol 1/35 ([place] 1/35, 28,) tablet    Sig: Take 1 tablet by mouth daily.    Dispense:  1 Package    Refill:  11  . ibuprofen (ADVIL) 800 MG tablet    Sig: Take 1 tablet (800 mg total) by mouth every 8 (eight) hours as needed.    Dispense:  30 tablet    Refill:  5      Shelly Bombard, MD 08/02/2019 4:31 PM

## 2019-08-02 NOTE — Progress Notes (Signed)
Pt presents for brown bleeding x 3 weeks. Taking Norethindrone qd - not breast feeding. Pt requests treatment for genital warts and all STD testing.

## 2019-08-03 LAB — HEPATITIS B SURFACE ANTIGEN: Hepatitis B Surface Ag: NEGATIVE

## 2019-08-03 LAB — HEPATITIS C ANTIBODY: Hep C Virus Ab: 0.1 s/co ratio (ref 0.0–0.9)

## 2019-08-03 LAB — HIV ANTIBODY (ROUTINE TESTING W REFLEX): HIV Screen 4th Generation wRfx: NONREACTIVE

## 2019-08-03 LAB — RPR: RPR Ser Ql: NONREACTIVE

## 2019-08-04 LAB — CERVICOVAGINAL ANCILLARY ONLY
Bacterial Vaginitis (gardnerella): NEGATIVE
Candida Glabrata: NEGATIVE
Candida Vaginitis: NEGATIVE
Chlamydia: NEGATIVE
Comment: NEGATIVE
Comment: NEGATIVE
Comment: NEGATIVE
Comment: NEGATIVE
Comment: NEGATIVE
Comment: NORMAL
Neisseria Gonorrhea: NEGATIVE
Trichomonas: NEGATIVE

## 2019-08-09 NOTE — Telephone Encounter (Signed)
Patient improving. Will follow up as needed.

## 2019-08-15 ENCOUNTER — Telehealth: Payer: Self-pay

## 2019-08-15 NOTE — Telephone Encounter (Signed)
I called patient to notify her that [facility] EEG consultants have been trying to contact her for an appointment for her nerve conduction study [phone]. Patient's mother stating she will have pt call me back. YL,RMA   Patient notified Tyler Deis

## 2019-08-15 NOTE — Telephone Encounter (Signed)
I called patient to notify her that [facility] EEG consultants have been trying to contact her for an appointment for her nerve conduction study [phone]. Patient's mother stating she will have pt call me back. YL,RMA

## 2019-08-16 ENCOUNTER — Other Ambulatory Visit: Payer: Managed Care, Other (non HMO)

## 2019-08-22 ENCOUNTER — Encounter: Payer: Self-pay | Admitting: Internal Medicine

## 2019-08-22 ENCOUNTER — Other Ambulatory Visit: Payer: Self-pay

## 2019-08-22 ENCOUNTER — Ambulatory Visit: Payer: Managed Care, Other (non HMO) | Admitting: Internal Medicine

## 2019-08-22 VITALS — BP 110/70 | HR 79 | Ht 64.6 in | Wt 227.0 lb

## 2019-08-22 DIAGNOSIS — E0500 Thyrotoxicosis with diffuse goiter without thyrotoxic crisis or storm: Secondary | ICD-10-CM | POA: Diagnosis not present

## 2019-08-22 LAB — TSH: TSH: 1.15 u[IU]/mL (ref 0.35–4.50)

## 2019-08-22 LAB — T3, FREE: T3, Free: 4.2 pg/mL (ref 2.3–4.2)

## 2019-08-22 LAB — T4, FREE: Free T4: 0.83 ng/dL (ref 0.60–1.60)

## 2019-08-22 NOTE — Patient Instructions (Signed)
Please stop at the lab.  We may need to stop [place].  Please come back for a follow-up appointment in 1 year.

## 2019-08-22 NOTE — Progress Notes (Signed)
Patient ID: Krista Hogan, female   DOB: 24-Aug-1980, 39 y.o.   MRN: [id number]   This visit occurred during the SARS-CoV-2 public health emergency.  Safety protocols were in place, including screening questions prior to the visit, additional usage of staff PPE, and extensive cleaning of exam room while observing appropriate contact time as indicated for disinfecting solutions.   HPI  Krista Hogan is a 39 y.o.-year-old female, returning for f/u for Graves ds. She previously saw Dr. Loanne Drilling, last visit 11/2015. Last visit with me 1 year ago.  Reviewed and addended history: She was dx'ed with thyrotoxicosis in summer 2017. She was Rx'ed MMI >> initially refused 2/2 concern for poss. weight gain   After I saw her, we checked labs which confirmed Graves' disease so he started the low-dose methimazole, 2.5 mg daily.  She initially developed headaches, but then she realized that this could have been due to allergies.  Due to the plans for pregnancy, we switched to a low-dose PTU, 50 mg daily.  We did discuss at that time that at the beginning of the second trimester, she will need to switch back to methimazole, but she did not contact me when she entered the second trimester, therefore, she continued PTU until set of labs in 09/2017.    Since the tests were excellent, we stopped PTU at that time.  She did well during the pregnancy but she was lost for follow-up between 10/2017-08/2018.  In 08/2018, labs were normal.    In 02/2019, TSH was slightly low and I advised her to restart methimazole 2.5 mg daily in 02/2019. She did not start as she forgot.  Review her TFTs: Lab Results  Component Value Date   TSH 0.445 (L) 02/24/2019   TSH 0.44 08/23/2018   TSH 1.17 11/02/2017   TSH 1.28 10/07/2017   TSH 0.39 06/25/2017   TSH 0.50 01/01/2017   TSH 0.00 (L) 09/23/2016   TSH 0.01 (L) 10/20/2015   FREET4 1.24 02/24/2019   FREET4 0.83 08/23/2018   FREET4 0.65 11/02/2017   FREET4 0.62 10/07/2017    FREET4 0.72 06/25/2017   FREET4 0.73 01/01/2017   FREET4 1.05 09/23/2016   FREET4 2.0 (H) 10/20/2015  08/2016: TSH 0.011 (received after appt)  We diagnosed Graves' disease based on the disease course and elevated TSI antibodies.  These normalized at last check: Lab Results  Component Value Date   TSI 126 11/02/2017   TSI 273 (H) 09/23/2016   Pt denies: - feeling nodules in neck - hoarseness - choking - SOB with lying down But she has occasional dysphagia.  Pt does have a FH of thyroid ds. In cousin  No FH of thyroid cancer. No h/o radiation tx to head or neck.  No seaweed or kelp. No recent contrast studies. + herbal supplements - fenugreek-started approximately 1 month ago. + Biotin use (not in last 1 week). No recent steroids use.   Pt. also has a history of myomectomy.  She gave birth to a healthy baby in 12/2017.  ROS: Constitutional: + weight gain/no weight loss, no fatigue, + subjective hyperthermia, no subjective hypothermia Eyes: no blurry vision, no xerophthalmia ENT: no sore throat, + see HPI Cardiovascular: no CP/no SOB/+ occasional palpitations/no leg swelling Respiratory: no cough/no SOB/no wheezing Gastrointestinal: + N (fenugreek)/no V/no D/no C/no acid reflux Musculoskeletal: no muscle aches/no joint aches Skin: no rashes, no hair loss Neurological: no tremors/no numbness/no tingling/no dizziness  I reviewed pt's medications, allergies, PMH, social hx, family hx, and  changes were documented in the history of present illness. Otherwise, unchanged from my initial visit note.  Past Medical History:  Diagnosis Date  . DUB (dysfunctional uterine bleeding)   . Graves disease 06/2016  . History of abnormal cervical Pap smear    2011  . History of HPV infection   . Uterine fibroid   . Wears glasses    Past Surgical History:  Procedure Laterality Date  . CESAREAN SECTION N/A 01/06/2018   Procedure: CESAREAN SECTION;  Surgeon: Caren Macadam, MD;   Location: [place];  Service: Obstetrics;  Laterality: N/A;  . EXCISION OF SKIN TAG  11/22/2014   Procedure: EXCISION OF SKIN TAG VULVA;  Surgeon: Governor Specking, MD;  Location: [place];  Service: Gynecology;;  . LAPAROSCOPIC GELPORT ASSISTED MYOMECTOMY N/A 11/22/2014   Procedure: LAPAROSCOPIC GELPORT ASSISTED MYOMECTOMY WITH CHROMOTUBATION;  Surgeon: Governor Specking, MD;  Location: [place];  Service: Gynecology;  Laterality: N/A;  . LAPAROSCOPY N/A 11/22/2014   Procedure: LAPAROSCOPY DIAGNOSTIC;  Surgeon: Governor Specking, MD;  Location: [place];  Service: Gynecology;  Laterality: N/A;  . LEEP  2011  . TONSILLECTOMY AND ADENOIDECTOMY  age 58  . WISDOM TOOTH EXTRACTION  2006   Social History   Social History  . Marital status: Single    Spouse name: N/A  . Number of children: 0   Occupational History  . Lab tech   Social History Main Topics  . Smoking status: Current Some Day Smoker    Packs/day: 0.25    Years: 10.00    Types: Cigarettes  . Smokeless tobacco: Never Used  . Alcohol use 0.0 oz/week     Comment: SOCIAL  . Drug use: No  . Sexual activity: Yes    Birth control/ protection: None   Current Outpatient Medications on File Prior to Visit  Medication Sig Dispense Refill  . acetaminophen (TYLENOL) 500 MG tablet Take 500 mg by mouth every 6 (six) hours as needed for moderate pain.    . cetirizine (ZYRTEC) 10 MG tablet Take 1 tablet (10 mg total) by mouth daily. 30 tablet 11  . cholecalciferol (VITAMIN D) 1000 units tablet Take 1,000 Units by mouth daily.    . cyanocobalamin 1000 MCG tablet Take 1,000 mcg by mouth daily.    Marland Kitchen docusate sodium (COLACE) 100 MG capsule Take 1 capsule (100 mg total) by mouth 2 (two) times daily. (Patient not taking: Reported on 08/02/2019) 60 capsule 11  . ibuprofen (ADVIL) 600 MG tablet Take 1 tablet (600 mg total) by mouth every 6 (six) hours. (Patient not taking: Reported on  08/02/2019) 60 tablet 1  . ibuprofen (ADVIL) 800 MG tablet Take 1 tablet (800 mg total) by mouth every 8 (eight) hours as needed. 30 tablet 5  . methimazole (TAPAZOLE) 5 MG tablet Take 0.5 tablets (2.5 mg total) by mouth daily. (Patient not taking: Reported on 07/12/2019) 45 tablet 3  . norethindrone-ethinyl estradiol 1/35 ([place] 1/35, 28,) tablet Take 1 tablet by mouth daily. 1 Package 11  . Omega-3 Fatty Acids (FISH OIL) 1000 MG CAPS Take 1,000 mg by mouth daily.     . polyethylene glycol (MIRALAX) 17 g packet Take 17 g by mouth daily. (Patient not taking: Reported on 07/12/2019) 30 each 11  . tolnaftate (TINACTIN) 1 % spray Apply topically 2 (two) times daily. (Patient not taking: Reported on 08/02/2019) 130 g 0   No current facility-administered medications on file prior to visit.   Allergies  Allergen Reactions  . Minocycline Rash   Family History  Problem Relation Age of Onset  . Fibroids Mother   . Thyroid disease Neg Hx    PE: BP 110/70   Pulse 79   Ht 5' 4.6" (1.641 m)   Wt 227 lb (103 kg)   SpO2 97%   BMI 38.24 kg/m  Wt Readings from Last 3 Encounters:  08/22/19 227 lb (103 kg)  08/02/19 227 lb 8 oz (103.2 kg)  07/12/19 227 lb 3.2 oz (103.1 kg)   Constitutional: overweight, in NAD Eyes: PERRLA, EOMI, no exophthalmos ENT: moist mucous membranes, + mild symmetric thyromegaly, no cervical lymphadenopathy Cardiovascular: RRR, No MRG Respiratory: CTA B Gastrointestinal: abdomen soft, NT, ND, BS+ Musculoskeletal: no deformities, strength intact in all 4 Skin: moist, warm, no rashes Neurological: no tremor with outstretched hands, DTR normal in all 4  ASSESSMENT: 1. Graves ds  PLAN:  1. Patient with history of Graves' disease, diagnosed based on the course of the disease and elevated TSI antibodies.  Therefore, we skipped the thyroid uptake and scan for diagnosis.  At last visit, she was postpartum after an uneventful birth and the healthy baby.  She was on PTU before  the pregnancy, but she did not require the medication during pregnancy.  However, before last visit in 08/2018, she was lost for follow-up since 10/2017.  When I last saw her, she had no hyperthyroid symptoms.   -At last visit, TFTs were normal, however, we repeated labs in 02/2019 and these returned abnormal.  At that time, I advised her to start methimazole 2.5 mg daily.  She does not remember seeing the message in my chart.  Therefore, as of now, she is not on methimazole. -At this visit, she again does not have hyperthyroid symptoms except occasional palpitations, which are not new.  She did gain weight, approximately 23 pounds since last visit. -will recheck her TFTs and we discussed that we may need to start a low-dose methimazole afterwards.  She agrees with this. -She is on fenugreek, which can influence the thyroid tests.  We will check them today but if abnormal, she may need to stop fenugreek and come for recheck. -No plans for another pregnancy -I we will see her back in a year but for labs sooner  Component     Latest Ref Rng & Units 08/22/2019  TSH     0.35 - 4.50 uIU/mL 1.15  T4,Free(Direct)     0.60 - 1.60 ng/dL 0.83  Triiodothyronine,Free,Serum     2.3 - 4.2 pg/mL 4.2   Labs are excellent now, no need to start thionamides.  Philemon Kingdom, MD PhD [facility]

## 2019-08-23 ENCOUNTER — Ambulatory Visit: Payer: Managed Care, Other (non HMO) | Admitting: Physician Assistant

## 2019-08-29 DIAGNOSIS — J392 Other diseases of pharynx: Secondary | ICD-10-CM | POA: Insufficient documentation

## 2019-08-29 DIAGNOSIS — J343 Hypertrophy of nasal turbinates: Secondary | ICD-10-CM | POA: Insufficient documentation

## 2019-08-29 DIAGNOSIS — R198 Other specified symptoms and signs involving the digestive system and abdomen: Secondary | ICD-10-CM | POA: Insufficient documentation

## 2019-08-29 DIAGNOSIS — R0989 Other specified symptoms and signs involving the circulatory and respiratory systems: Secondary | ICD-10-CM | POA: Insufficient documentation

## 2019-08-29 DIAGNOSIS — J310 Chronic rhinitis: Secondary | ICD-10-CM | POA: Insufficient documentation

## 2019-08-29 DIAGNOSIS — K219 Gastro-esophageal reflux disease without esophagitis: Secondary | ICD-10-CM | POA: Insufficient documentation

## 2019-08-29 HISTORY — DX: Hypertrophy of nasal turbinates: J34.3

## 2019-10-12 ENCOUNTER — Other Ambulatory Visit: Payer: Self-pay | Admitting: Obstetrics

## 2019-11-02 DIAGNOSIS — L700 Acne vulgaris: Secondary | ICD-10-CM | POA: Insufficient documentation

## 2019-11-02 DIAGNOSIS — L905 Scar conditions and fibrosis of skin: Secondary | ICD-10-CM

## 2019-11-02 DIAGNOSIS — L730 Acne keloid: Secondary | ICD-10-CM | POA: Insufficient documentation

## 2019-11-02 DIAGNOSIS — L819 Disorder of pigmentation, unspecified: Secondary | ICD-10-CM | POA: Insufficient documentation

## 2019-11-02 HISTORY — DX: Scar conditions and fibrosis of skin: L90.5

## 2019-11-02 HISTORY — DX: Acne keloid: L73.0

## 2019-11-02 HISTORY — DX: Disorder of pigmentation, unspecified: L81.9

## 2019-12-27 ENCOUNTER — Ambulatory Visit: Payer: Managed Care, Other (non HMO) | Admitting: Nurse Practitioner

## 2019-12-27 ENCOUNTER — Other Ambulatory Visit: Payer: Self-pay

## 2019-12-27 ENCOUNTER — Encounter: Payer: Self-pay | Admitting: Nurse Practitioner

## 2019-12-27 VITALS — BP 118/76 | HR 73 | Temp 98.3°F | Ht 64.6 in | Wt 224.0 lb

## 2019-12-27 DIAGNOSIS — R21 Rash and other nonspecific skin eruption: Secondary | ICD-10-CM

## 2019-12-27 DIAGNOSIS — R1084 Generalized abdominal pain: Secondary | ICD-10-CM

## 2019-12-27 DIAGNOSIS — Z789 Other specified health status: Secondary | ICD-10-CM

## 2019-12-27 DIAGNOSIS — L299 Pruritus, unspecified: Secondary | ICD-10-CM | POA: Insufficient documentation

## 2019-12-27 DIAGNOSIS — Z23 Encounter for immunization: Secondary | ICD-10-CM | POA: Diagnosis not present

## 2019-12-27 DIAGNOSIS — Z7289 Other problems related to lifestyle: Secondary | ICD-10-CM

## 2019-12-27 NOTE — Patient Instructions (Signed)
Influenza Virus Vaccine (Flucelvax) What is this medicine? INFLUENZA VIRUS VACCINE (in floo EN zuh VAHY ruhs vak SEEN) helps to reduce the risk of getting influenza also known as the flu. The vaccine only helps protect you against some strains of the flu. This medicine may be used for other purposes; ask your health care provider or pharmacist if you have questions. COMMON BRAND NAME(S): FLUCELVAX What should I tell my health care provider before I take this medicine? They need to know if you have any of these conditions:  bleeding disorder like hemophilia  fever or infection  Guillain-Barre syndrome or other neurological problems  immune system problems  infection with the human immunodeficiency virus (HIV) or AIDS  low blood platelet counts  multiple sclerosis  an unusual or allergic reaction to influenza virus vaccine, other medicines, foods, dyes or preservatives  pregnant or trying to get pregnant  breast-feeding How should I use this medicine? This vaccine is for injection into a muscle. It is given by a health care professional. A copy of Vaccine Information Statements will be given before each vaccination. Read this sheet carefully each time. The sheet may change frequently. Talk to your pediatrician regarding the use of this medicine in children. Special care may be needed. Overdosage: If you think you've taken too much of this medicine contact a poison control center or emergency room at once. Overdosage: If you think you have taken too much of this medicine contact a poison control center or emergency room at once. NOTE: This medicine is only for you. Do not share this medicine with others. What if I miss a dose? This does not apply. What may interact with this medicine?  chemotherapy or radiation therapy  medicines that lower your immune system like etanercept, anakinra, infliximab, and adalimumab  medicines that treat or prevent blood clots like  warfarin  phenytoin  steroid medicines like prednisone or cortisone  theophylline  vaccines This list may not describe all possible interactions. Give your health care provider a list of all the medicines, herbs, non-prescription drugs, or dietary supplements you use. Also tell them if you smoke, drink alcohol, or use illegal drugs. Some items may interact with your medicine. What should I watch for while using this medicine? Report any side effects that do not go away within 3 days to your doctor or health care professional. Call your health care provider if any unusual symptoms occur within 6 weeks of receiving this vaccine. You may still catch the flu, but the illness is not usually as bad. You cannot get the flu from the vaccine. The vaccine will not protect against colds or other illnesses that may cause fever. The vaccine is needed every year. What side effects may I notice from receiving this medicine? Side effects that you should report to your doctor or health care professional as soon as possible:  allergic reactions like skin rash, itching or hives, swelling of the face, lips, or tongue Side effects that usually do not require medical attention (Report these to your doctor or health care professional if they continue or are bothersome.):  fever  headache  muscle aches and pains  pain, tenderness, redness, or swelling at the injection site  tiredness This list may not describe all possible side effects. Call your doctor for medical advice about side effects. You may report side effects to FDA at 1-800-FDA-1088. Where should I keep my medicine? The vaccine will be given by a health care professional in a clinic, pharmacy, doctor's   office, or other health care setting. You will not be given vaccine doses to store at home. NOTE: This sheet is a summary. It may not cover all possible information. If you have questions about this medicine, talk to your doctor, pharmacist, or  health care provider.  2020 Elsevier/Gold Standard (2011-02-05 14:06:47)  

## 2019-12-27 NOTE — Progress Notes (Signed)
I,Yamilka Roman [employer] as a Education administrator for [employer], FNP.,have documented all relevant documentation on the behalf of Minette Brine, FNP,as directed by  Minette Brine, FNP while in the presence of Minette Brine, [place]. This visit occurred during the SARS-CoV-2 public health emergency.  Safety protocols were in place, including screening questions prior to the visit, additional usage of staff PPE, and extensive cleaning of exam room while observing appropriate contact time as indicated for disinfecting solutions.  Subjective:     Patient ID: Krista Hogan , female    DOB: 11-03-1980 , 39 y.o.   MRN: [id number]   Chief Complaint  Patient presents with  . Allergic Reaction    patient stated she had an allergic reaction she is just unsure to what she thinks it may have been a soap she used she had itching on her hands and feet   . Abdominal Cramping  . Hypertension    patient went to the dermatologist and her bp was high so they placed her on bp meds  . Referral    podiatry     HPI  She is here today for possible allergic reaction to bug spray or soap. She had an elevated blood pressure during her visit at the Urgent care  She does admit to drinking 3-4 shots a day everyday for the last year.  She reports this as being a habit.        Past Medical History:  Diagnosis Date  . DUB (dysfunctional uterine bleeding)   . Graves disease 06/2016  . History of abnormal cervical Pap smear    2011  . History of HPV infection   . Uterine fibroid   . Wears glasses      Family History  Problem Relation Age of Onset  . Fibroids Mother   . Thyroid disease Neg Hx      Current Outpatient Medications:  .  cetirizine (ZYRTEC) 10 MG tablet, Take 1 tablet (10 mg total) by mouth daily., Disp: 30 tablet, Rfl: 11 .  cholecalciferol (VITAMIN D) 1000 units tablet, Take 1,000 Units by mouth daily., Disp: , Rfl:  .  cyanocobalamin 1000 MCG tablet, Take 1,000 mcg by mouth daily., Disp: , Rfl:  .   ibuprofen (ADVIL) 800 MG tablet, Take 1 tablet (800 mg total) by mouth every 8 (eight) hours as needed., Disp: 30 tablet, Rfl: 5 .  norethindrone-ethinyl estradiol 1/35 ([place] 1/35, 28,) tablet, Take 1 tablet by mouth daily., Disp: 1 Package, Rfl: 11 .  Omega-3 Fatty Acids (FISH OIL) 1000 MG CAPS, Take 1,000 mg by mouth daily. , Disp: , Rfl:  .  SPIRONOLACTONE PO, Take by mouth daily., Disp: , Rfl:  .  acetaminophen (TYLENOL) 500 MG tablet, Take 500 mg by mouth every 6 (six) hours as needed for moderate pain. (Patient not taking: Reported on 12/27/2019), Disp: , Rfl:    Allergies  Allergen Reactions  . Minocycline Rash     Review of Systems  Constitutional: Negative.   Respiratory: Negative.   Cardiovascular: Negative.  Negative for chest pain, palpitations and leg swelling.  Neurological: Negative for dizziness and headaches.  Psychiatric/Behavioral: Negative.      Today's Vitals   12/27/19 1616  BP: 118/76  Pulse: 73  Temp: 98.3 F (36.8 C)  TempSrc: Oral  Weight: 224 lb (101.6 kg)  Height: 5' 4.6" (1.641 m)   Body mass index is 37.74 kg/m.   Objective:  Physical Exam Vitals reviewed.  Constitutional:      General:  She is not in acute distress.    Appearance: Normal appearance.  Cardiovascular:     Rate and Rhythm: Normal rate and regular rhythm.     Pulses: Normal pulses.     Heart sounds: Normal heart sounds. No murmur heard.   Pulmonary:     Effort: Pulmonary effort is normal. No respiratory distress.     Breath sounds: Normal breath sounds.  Abdominal:     General: Bowel sounds are normal. There is no distension.     Palpations: There is no mass.     Tenderness: There is no abdominal tenderness.  Skin:    General: Skin is warm and dry.     Capillary Refill: Capillary refill takes less than 2 seconds.  Neurological:     General: No focal deficit present.     Mental Status: She is alert and oriented to person, place, and time.     Cranial Nerves: No  cranial nerve deficit.  Psychiatric:        Mood and Affect: Mood normal.        Behavior: Behavior normal.        Thought Content: Thought content normal.        Judgment: Judgment normal.         Assessment And Plan:     1. Generalized abdominal cramping Will check for liver functions, infection or blood loss in hemoglobin nontender on palpation I am concerned she may be having symptoms related to her drinking.  - Amylase - Lipase - CMP14+EGFR - CBC with Differential/Platelet - Iron, TIBC and Ferritin Panel - H. pylori antibody, IgA - H. pylori antibody, IgG - HELICOBACTER PYLORI  ANTIBODY, IGM  2. Itching of both hands  Will check liver functions  This may also be caused by anxiety  3. Alcohol use  I am concerned with her alcohol use and the reasoning behind why she is drinking so much, I will refer to a counselor to see if this helps get to the root of her drinking.  I have advised her to not attempt to stop suddenly with her alcohol drinking to avoid detoxing and increasing her risk for seizures.  I will check a GGT today as well - Gamma GT, GGT (62952) - Ambulatory referral to Psychology  4. Rash of foot - Ambulatory referral to Podiatry  5. Encounter for immunization  Influenza vaccine administered  Encouraged to take Tylenol as needed for fever or muscle aches. - Flu Vaccine QUAD 6+ mos PF IM (Fluarix Quad PF)    Patient was given opportunity to ask questions. Patient verbalized understanding of the plan and was able to repeat key elements of the plan. All questions were answered to their satisfaction.   Teola Bradley, FNP, have reviewed all documentation for this visit. The documentation on 01/05/20 for the exam, diagnosis, procedures, and orders are all accurate and complete.   THE PATIENT IS ENCOURAGED TO PRACTICE SOCIAL DISTANCING DUE TO THE COVID-19 PANDEMIC.

## 2019-12-28 LAB — IRON,TIBC AND FERRITIN PANEL
Ferritin: 192 ng/mL — ABNORMAL HIGH (ref 15–150)
Iron Saturation: 20 % (ref 15–55)
Iron: 63 ug/dL (ref 27–159)
Total Iron Binding Capacity: 311 ug/dL (ref 250–450)
UIBC: 248 ug/dL (ref 131–425)

## 2019-12-28 LAB — CMP14+EGFR
ALT: 8 IU/L (ref 0–32)
AST: 11 IU/L (ref 0–40)
Albumin/Globulin Ratio: 1.4 (ref 1.2–2.2)
Albumin: 4.2 g/dL (ref 3.8–4.8)
Alkaline Phosphatase: 71 IU/L (ref 44–121)
BUN/Creatinine Ratio: 12 (ref 9–23)
BUN: 11 mg/dL (ref 6–20)
Bilirubin Total: 0.3 mg/dL (ref 0.0–1.2)
CO2: 23 mmol/L (ref 20–29)
Calcium: 9.7 mg/dL (ref 8.7–10.2)
Chloride: 100 mmol/L (ref 96–106)
Creatinine, Ser: 0.9 mg/dL (ref 0.57–1.00)
GFR calc Af Amer: 94 mL/min/{1.73_m2} (ref >59)
GFR calc non Af Amer: 81 mL/min/{1.73_m2} (ref >59)
Globulin, Total: 3.1 g/dL (ref 1.5–4.5)
Glucose: 83 mg/dL (ref 65–99)
Potassium: 4.3 mmol/L (ref 3.5–5.2)
Sodium: 136 mmol/L (ref 134–144)
Total Protein: 7.3 g/dL (ref 6.0–8.5)

## 2019-12-28 LAB — GAMMA GT: GGT: 18 IU/L (ref 0–60)

## 2019-12-28 LAB — HELICOBACTER PYLORI  ANTIBODY, IGM: H pylori, IgM Abs: <9 units (ref 0.0–8.9)

## 2019-12-28 LAB — CBC WITH DIFFERENTIAL/PLATELET
Basophils Absolute: 0.1 10*3/uL (ref 0.0–0.2)
Basos: 1 %
EOS (ABSOLUTE): 0.1 10*3/uL (ref 0.0–0.4)
Eos: 1 %
Hematocrit: 42.4 % (ref 34.0–46.6)
Hemoglobin: 13.8 g/dL (ref 11.1–15.9)
Immature Grans (Abs): 0 10*3/uL (ref 0.0–0.1)
Immature Granulocytes: 0 %
Lymphocytes Absolute: 3.9 10*3/uL — ABNORMAL HIGH (ref 0.7–3.1)
Lymphs: 42 %
MCH: 30.3 pg (ref 26.6–33.0)
MCHC: 32.5 g/dL (ref 31.5–35.7)
MCV: 93 fL (ref 79–97)
Monocytes Absolute: 0.8 10*3/uL (ref 0.1–0.9)
Monocytes: 8 %
Neutrophils Absolute: 4.6 10*3/uL (ref 1.4–7.0)
Neutrophils: 48 %
Platelets: 374 10*3/uL (ref 150–450)
RBC: 4.56 x10E6/uL (ref 3.77–5.28)
RDW: 12.7 % (ref 11.7–15.4)
WBC: 9.3 10*3/uL (ref 3.4–10.8)

## 2019-12-28 LAB — LIPASE: Lipase: 17 U/L (ref 14–72)

## 2019-12-28 LAB — H. PYLORI ANTIBODY, IGG: H. pylori, IgG AbS: 0.32 Index Value (ref 0.00–0.79)

## 2019-12-28 LAB — AMYLASE: Amylase: 46 U/L (ref 31–110)

## 2019-12-28 LAB — H. PYLORI ANTIBODY, IGA: H. pylori, IgA Abs: <9 units (ref 0.0–8.9)

## 2019-12-29 ENCOUNTER — Encounter: Payer: Self-pay | Admitting: Nurse Practitioner

## 2019-12-30 NOTE — Progress Notes (Signed)
Yes which was slightly elevated, she needs to decrease her alcohol intake this can be a sign of liver disease.

## 2020-02-14 ENCOUNTER — Ambulatory Visit: Payer: Self-pay | Admitting: Nurse Practitioner

## 2020-02-16 ENCOUNTER — Ambulatory Visit: Payer: Managed Care, Other (non HMO) | Admitting: Sports Medicine

## 2020-02-16 ENCOUNTER — Encounter: Payer: Self-pay | Admitting: Sports Medicine

## 2020-02-16 ENCOUNTER — Other Ambulatory Visit: Payer: Self-pay

## 2020-02-16 DIAGNOSIS — B351 Tinea unguium: Secondary | ICD-10-CM | POA: Diagnosis not present

## 2020-02-16 DIAGNOSIS — M79675 Pain in left toe(s): Secondary | ICD-10-CM | POA: Diagnosis not present

## 2020-02-16 DIAGNOSIS — M79674 Pain in right toe(s): Secondary | ICD-10-CM | POA: Diagnosis not present

## 2020-02-16 DIAGNOSIS — L84 Corns and callosities: Secondary | ICD-10-CM

## 2020-02-16 NOTE — Progress Notes (Signed)
Subjective: Krista Hogan is a 39 y.o. female patient seen today in office with complaint of mildly painful thickened and discolored nails. Patient is desiring treatment for nail changes; has tried OTC topicals/Medication in the past with no improvement. Reports that nails are becoming difficult to manage because of the thickness and callus skin to heels. Patient has no other pedal complaints at this time.   Patient Active Problem List   Diagnosis Date Noted  . Itching of both hands 12/27/2019  . Acne scarring 11/02/2019  . Acne vulgaris 11/02/2019  . Hyperpigmentation 11/02/2019  . Gastroesophageal reflux disease without esophagitis 08/29/2019  . Globus pharyngeus 08/29/2019  . Nasal turbinate hypertrophy 08/29/2019  . Rhinitis, chronic 08/29/2019  . Throat irritation 08/29/2019  . Palpitations 02/25/2019  . Dysesthesia 02/01/2019  . Groin pain 02/01/2019  . Obesity 02/01/2019  . Postpartum hypertension 01/10/2018  . S/P repeat low transverse C-section 01/06/2018  . Supervision of other normal pregnancy, antepartum 07/08/2017  . Graves disease 10/01/2016  . Genital herpes simplex 07/29/2016  . Cyst of ovary 07/28/2016  . Genital warts 07/28/2016  . 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 Migraine 07/28/2016  . Hypothyroidism 12/17/2015  . Mucosal irritation of oral cavity 12/17/2015  . Otalgia, right 12/17/2015  . Chronic pelvic pain in female 11/15/2015  . 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    Current Outpatient Medications on File Prior to Visit  Medication Sig Dispense Refill  . acetaminophen (TYLENOL) 500 MG tablet Take 500 mg by mouth every 6 (six) hours as needed for moderate pain.    . cetirizine (ZYRTEC) 10 MG tablet Take 1 tablet (10 mg total) by mouth daily. 30 tablet 11  . cholecalciferol (VITAMIN D) 1000 units tablet Take 1,000 Units by mouth daily.    . clindamycin-benzoyl peroxide (BENZACLIN) gel Apply 1 application topically every morning.    .  cyanocobalamin 1000 MCG tablet Take 1,000 mcg by mouth daily.    Marland Kitchen ibuprofen (ADVIL) 800 MG tablet Take 1 tablet (800 mg total) by mouth every 8 (eight) hours as needed. 30 tablet 5  . norethindrone-ethinyl estradiol (CYCLAFEM) 0.5/0.75/1-35 MG-MCG tablet Take by mouth.    . norethindrone-ethinyl estradiol 1/35 ([place] 1/35, 28,) tablet Take 1 tablet by mouth daily. 1 Package 11  . Omega-3 Fatty Acids (FISH OIL) 1000 MG CAPS Take 1,000 mg by mouth daily.     Marland Kitchen SPIRONOLACTONE PO Take by mouth daily.    . tacrolimus (PROTOPIC) 0.1 % ointment Apply to neck daily to twice daily as needed.    . tretinoin (RETIN-A) 0.025 % cream Apply to face nightly to tolerance    . triamcinolone cream (KENALOG) 0.5 % Apply topically.     No current facility-administered medications on file prior to visit.    Allergies  Allergen Reactions  . Minocycline Rash    Objective: Physical Exam  General: Well developed, nourished, no acute distress, awake, alert and oriented x 3  Vascular: Dorsalis pedis artery 2/4 bilateral, Posterior tibial artery 2/4 bilateral, skin temperature warm to warm proximal to distal bilateral lower extremities, no varicosities, pedal hair present bilateral.  Neurological: Gross sensation present via light touch bilateral.   Dermatological: Skin is warm, dry, and supple bilateral, Nails 1-10 are tender, short thick, and discolored with mild subungal debris, no webspace macerations present bilateral, no open lesions present bilateral, + hyperkeratotic tissue present bilateral. No signs of infection bilateral.  Musculoskeletal: No boney deformities noted bilateral. Muscular strength within normal limits without painon range of  motion. No pain with calf compression bilateral.  Assessment and Plan:  Problem List Items Addressed This Visit   None   Visit Diagnoses    Nail fungal infection    -  Primary   Relevant Medications   tacrolimus (PROTOPIC) 0.1 % ointment   Toe pain,  bilateral       Callus of heel          -Examined patient -Discussed treatment options for painful dystrophic nails and callus to heels -Fungal culture was obtained by removing a portion of the hard nail itself from each of the involved toenails using a sterile nail nipper and sent to [facility] lab. Patient tolerated the biopsy procedure well without discomfort or need for anesthesia.  -Recommend patient to try foot miracle cream for callus skin -Patient to return in 4 weeks for follow up evaluation and discussion of fungal culture results or sooner if symptoms worsen.  Landis Martins, DPM

## 2020-03-15 ENCOUNTER — Ambulatory Visit: Payer: Managed Care, Other (non HMO) | Admitting: Sports Medicine

## 2020-03-21 DIAGNOSIS — L249 Irritant contact dermatitis, unspecified cause: Secondary | ICD-10-CM

## 2020-03-21 HISTORY — DX: Irritant contact dermatitis, unspecified cause: L24.9

## 2020-03-29 ENCOUNTER — Ambulatory Visit: Payer: Managed Care, Other (non HMO) | Admitting: Sports Medicine

## 2020-03-29 ENCOUNTER — Encounter: Payer: Self-pay | Admitting: Sports Medicine

## 2020-03-29 ENCOUNTER — Other Ambulatory Visit: Payer: Self-pay

## 2020-03-29 DIAGNOSIS — B351 Tinea unguium: Secondary | ICD-10-CM | POA: Diagnosis not present

## 2020-03-29 DIAGNOSIS — L84 Corns and callosities: Secondary | ICD-10-CM

## 2020-03-29 DIAGNOSIS — M79675 Pain in left toe(s): Secondary | ICD-10-CM | POA: Diagnosis not present

## 2020-03-29 DIAGNOSIS — M79674 Pain in right toe(s): Secondary | ICD-10-CM

## 2020-03-29 DIAGNOSIS — K5904 Chronic idiopathic constipation: Secondary | ICD-10-CM | POA: Insufficient documentation

## 2020-03-29 DIAGNOSIS — R1031 Right lower quadrant pain: Secondary | ICD-10-CM | POA: Insufficient documentation

## 2020-03-29 DIAGNOSIS — K600 Acute anal fissure: Secondary | ICD-10-CM | POA: Insufficient documentation

## 2020-03-29 HISTORY — DX: Acute anal fissure: K60.0

## 2020-03-29 NOTE — Progress Notes (Signed)
Subjective: Krista Hogan is a 40 y.o. female patient seen today in office for fungal culture results. Reports that sometimes her left>right 1st toe and 2nd toenail on the left gets sore, pinch in. Patient has no other pedal complaints at this time.   Patient Active Problem List   Diagnosis Date Noted  . Itching of both hands 12/27/2019  . Acne scarring 11/02/2019  . Acne vulgaris 11/02/2019  . Hyperpigmentation 11/02/2019  . Gastroesophageal reflux disease without esophagitis 08/29/2019  . Globus pharyngeus 08/29/2019  . Nasal turbinate hypertrophy 08/29/2019  . Rhinitis, chronic 08/29/2019  . Throat irritation 08/29/2019  . Palpitations 02/25/2019  . Dysesthesia 02/01/2019  . Groin pain 02/01/2019  . Obesity 02/01/2019  . Postpartum hypertension 01/10/2018  . S/P repeat low transverse C-section 01/06/2018  . Supervision of other normal pregnancy, antepartum 07/08/2017  . Graves disease 10/01/2016  . Genital herpes simplex 07/29/2016  . Cyst of ovary 07/28/2016  . Genital warts 07/28/2016  . 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 Migraine 07/28/2016  . Hypothyroidism 12/17/2015  . Mucosal irritation of oral cavity 12/17/2015  . Otalgia, right 12/17/2015  . Chronic pelvic pain in female 11/15/2015  . 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    Current Outpatient Medications on File Prior to Visit  Medication Sig Dispense Refill  . acetaminophen (TYLENOL) 500 MG tablet Take 500 mg by mouth every 6 (six) hours as needed for moderate pain.    . cetirizine (ZYRTEC) 10 MG tablet Take 1 tablet (10 mg total) by mouth daily. 30 tablet 11  . cholecalciferol (VITAMIN D) 1000 units tablet Take 1,000 Units by mouth daily.    . clindamycin-benzoyl peroxide (BENZACLIN) gel Apply 1 application topically every morning.    . cyanocobalamin 1000 MCG tablet Take 1,000 mcg by mouth daily.    Marland Kitchen ibuprofen (ADVIL) 800 MG tablet Take 1 tablet (800 mg total) by mouth every 8 (eight) hours as  needed. 30 tablet 5  . norethindrone-ethinyl estradiol (CYCLAFEM) 0.5/0.75/1-35 MG-MCG tablet Take by mouth.    . norethindrone-ethinyl estradiol 1/35 ([place] 1/35, 28,) tablet Take 1 tablet by mouth daily. 1 Package 11  . Omega-3 Fatty Acids (FISH OIL) 1000 MG CAPS Take 1,000 mg by mouth daily.     Marland Kitchen SPIRONOLACTONE PO Take by mouth daily.    . tacrolimus (PROTOPIC) 0.1 % ointment Apply to neck daily to twice daily as needed.    . tretinoin (RETIN-A) 0.025 % cream Apply to face nightly to tolerance    . triamcinolone cream (KENALOG) 0.5 % Apply topically.     No current facility-administered medications on file prior to visit.    Allergies  Allergen Reactions  . Minocycline Rash    Objective: Physical Exam  General: Well developed, nourished, no acute distress, awake, alert and oriented x 3  Vascular: Dorsalis pedis artery 2/4 bilateral, Posterior tibial artery 1/4 bilateral, skin temperature warm to warm proximal to distal bilateral lower extremities, no varicosities, pedal hair present bilateral.  Neurological: Gross sensation present via light touch bilateral.   Dermatological: Skin is warm, dry, and supple bilateral, Nails 1-10 are tender, short thick, and discolored with mild subungal debris and pincer deformity Left 2nd toe and bilateral hallux with no acute ingrowing, no webspace macerations present bilateral, no open lesions present bilateral, + hyperkeratotic tissue present bilateral heels. No signs of infection bilateral.  Musculoskeletal:+ Pes planus, boney deformities noted bilateral. Muscular strength within normal limits without painon range of motion. No pain with calf compression bilateral.  Fungal culture- Microtrauma  Assessment and Plan:  Problem List Items Addressed This Visit   None   Visit Diagnoses    Nail fungal infection    -  Primary   Toe pain, bilateral       Callus of heel          -Examined patient -Discussed treatment options for  painful nonfungal nails/dystrophic due to microtrauma -Recommend Tolcylen or tea tree oil to nails -Advised patient to consider ingrown nail procedure in the future -Encouraged good supportive shoe and OTC insoles for foot type -Recommend foot miracle for heels -Patient to return as needed or sooner if symptoms worsen.  Landis Martins, DPM

## 2020-03-29 NOTE — Patient Instructions (Signed)
B6384 orthotic code Pes planus diagnosis code M21.40  For tennis shoes recommend:  [place] balance [place]con[place] Can be purchased at [employer] or [employer] arch fit Can be purchased at any major retailers  Vionic  SAS Can be purchased at [employer] or [employer]   For work shoes recommend: [employer] Work [employer] Can be purchased at a variety of places or Engineer, maintenance (IT)   For casual shoes recommend: Vionic  Can be purchased at [employer] or [employer]   For Over the [employer] recommend: Power Steps Can be purchased in office/[employer] and [place] Can be purchased at [employer] or [employer] Can be purchased at [employer]

## 2020-04-12 ENCOUNTER — Ambulatory Visit (INDEPENDENT_AMBULATORY_CARE_PROVIDER_SITE_OTHER): Payer: Managed Care, Other (non HMO)

## 2020-04-12 ENCOUNTER — Encounter: Payer: Self-pay | Admitting: Sports Medicine

## 2020-04-12 ENCOUNTER — Ambulatory Visit: Payer: Managed Care, Other (non HMO) | Admitting: Sports Medicine

## 2020-04-12 ENCOUNTER — Other Ambulatory Visit: Payer: Self-pay

## 2020-04-12 DIAGNOSIS — M79675 Pain in left toe(s): Secondary | ICD-10-CM | POA: Diagnosis not present

## 2020-04-12 DIAGNOSIS — L600 Ingrowing nail: Secondary | ICD-10-CM

## 2020-04-12 NOTE — Progress Notes (Signed)
Subjective: Krista Hogan is a 40 y.o. female patient presents to office today complaining of a moderately painful incurvated, swollen medial and lateral nail borders and at tip of the 1st toe on the left foot. This has been present for a few weeks slowly getting worse. Patient has treated this by soaking. Patient denies fever/chills/nausea/vomitting/any other related constitutional symptoms at this time.  Patient Active Problem List   Diagnosis Date Noted  . Right lower quadrant pain 03/29/2020  . Chronic idiopathic constipation 03/29/2020  . Acute anal fissure 03/29/2020  . Irritant dermatitis 03/21/2020  . Itching of both hands 12/27/2019  . Acne scarring 11/02/2019  . Acne vulgaris 11/02/2019  . Hyperpigmentation 11/02/2019  . Gastroesophageal reflux disease without esophagitis 08/29/2019  . Globus pharyngeus 08/29/2019  . Nasal turbinate hypertrophy 08/29/2019  . Rhinitis, chronic 08/29/2019  . Throat irritation 08/29/2019  . Palpitations 02/25/2019  . Dysesthesia 02/01/2019  . Groin pain 02/01/2019  . Obesity 02/01/2019  . Postpartum hypertension 01/10/2018  . S/P repeat low transverse C-section 01/06/2018  . Supervision of other normal pregnancy, antepartum 07/08/2017  . Graves disease 10/01/2016  . Genital herpes simplex 07/29/2016  . Cyst of ovary 07/28/2016  . Genital warts 07/28/2016  . 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 Migraine 07/28/2016  . Hypothyroidism 12/17/2015  . Mucosal irritation of oral cavity 12/17/2015  . Otalgia, right 12/17/2015  . Chronic pelvic pain in female 11/15/2015  . 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    Current Outpatient Medications on File Prior to Visit  Medication Sig Dispense Refill  . acetaminophen (TYLENOL) 500 MG tablet Take 500 mg by mouth every 6 (six) hours as needed for moderate pain.    . cetirizine (ZYRTEC) 10 MG tablet Take 1 tablet (10 mg total) by mouth daily. 30 tablet 11  . cholecalciferol (VITAMIN D)  1000 units tablet Take 1,000 Units by mouth daily.    . clindamycin-benzoyl peroxide (BENZACLIN) gel Apply 1 application topically every morning.    . cyanocobalamin 1000 MCG tablet Take 1,000 mcg by mouth daily.    . hydroquinone 4 % cream Apply to face nightly to every other night with tretinoin    . ibuprofen (ADVIL) 800 MG tablet Take 1 tablet (800 mg total) by mouth every 8 (eight) hours as needed. 30 tablet 5  . norethindrone-ethinyl estradiol (CYCLAFEM) 0.5/0.75/1-35 MG-MCG tablet Take by mouth.    . norethindrone-ethinyl estradiol 1/35 ([place] 1/35, 28,) tablet Take 1 tablet by mouth daily. 1 Package 11  . Omega-3 Fatty Acids (FISH OIL) 1000 MG CAPS Take 1,000 mg by mouth daily.     Marland Kitchen SPIRONOLACTONE PO Take by mouth daily.    . tacrolimus (PROTOPIC) 0.1 % ointment Apply to neck daily to twice daily as needed.    . tretinoin (RETIN-A) 0.025 % cream Apply to face nightly to tolerance    . triamcinolone cream (KENALOG) 0.5 % Apply topically.     No current facility-administered medications on file prior to visit.    Allergies  Allergen Reactions  . Minocycline Rash    Objective:  There were no vitals filed for this visit.  General: Well developed, nourished, in no acute distress, alert and oriented x3   Dermatology: Skin is warm, dry and supple bilateral. Left hallux nail appears to be  Moderately incurvated with hyperkeratosis formation at the distal aspects of  the medial and lateral nail borders, pincer deformity. (-) Erythema. (+) Minimal edema. (-) serosanguous  drainage present. The remaining nails appear unremarkable  at this time. There are no open sores, lesions or other signs of infection  present.  Vascular: Dorsalis Pedis artery and Posterior Tibial artery pedal pulses are 2/4 bilateral with immedate capillary fill time. Pedal hair growth present. No lower extremity edema.   Neruologic: Grossly intact via light touch bilateral.  Musculoskeletal: Tenderness to  palpation of the left hallux nail fold(s). Muscular strength within normal limits in all groups bilateral.   Assesement and Plan: Problem List Items Addressed This Visit   None   Visit Diagnoses    Pain of toe of left foot    -  Primary   Relevant Orders   DG Foot Complete Left   Ingrown nail          -Discussed treatment alternatives and plan of care; Explained permanent/temporary nail avulsion and post procedure course to patient. Patient elects for PNA left hallux with phenol - After a verbal and written consent, injected 3 ml of a 50:50 mixture of 2% plain  lidocaine and 0.5% plain marcaine in a normal hallux block fashion. Next, a  betadine prep was performed. Anesthesia was tested and found to be appropriate.  The offending left hallux medial and lateral nail borders were then incised from the hyponychium to the epinychium. The offending nail border was removed and cleared from the field. The area was curretted for any remaining nail or spicules. Phenol application performed and the area was then flushed with alcohol and dressed with antibiotic cream and a dry sterile dressing. -Patient was instructed to leave the dressing intact for today and begin soaking in a weak solution of betadine or Epsom salt and water tomorrow. Patient was instructed to soak for 15-20 minutes each day and apply neosporin/corticosporin and a gauze or bandaid dressing each day. -Patient was instructed to monitor the toe for signs of infection and return to office if toe becomes red, hot or swollen. -Advised ice, elevation, and tylenol or motrin if needed for pain.  -Work excuse provided; return on monday -Patient is to return in 2 weeks for follow up care/nail check or sooner if problems arise.  Landis Martins, DPM

## 2020-04-12 NOTE — Patient Instructions (Signed)

## 2020-04-17 ENCOUNTER — Encounter: Payer: Self-pay | Admitting: Sports Medicine

## 2020-04-17 ENCOUNTER — Other Ambulatory Visit: Payer: Self-pay | Admitting: Sports Medicine

## 2020-04-17 MED ORDER — TRAMADOL HCL 50 MG PO TABS
50.0000 mg | ORAL_TABLET | Freq: Three times a day (TID) | ORAL | 0 refills | Status: AC | PRN
Start: 2020-04-17 — End: 2020-04-22

## 2020-04-17 NOTE — Progress Notes (Signed)
Sent Tramadol for toe pain after procedure

## 2020-04-18 ENCOUNTER — Other Ambulatory Visit: Payer: Self-pay | Admitting: Sports Medicine

## 2020-04-18 ENCOUNTER — Encounter: Payer: Self-pay | Admitting: Sports Medicine

## 2020-04-18 MED ORDER — NEOMYCIN-POLYMYXIN-HC 3.5-10000-1 OT SOLN: OTIC | 0 refills | Status: DC

## 2020-04-18 NOTE — Progress Notes (Signed)
Sent Corticosporin for patient to use to nails instead of neosporin -Dr. Cannon Kettle

## 2020-04-26 ENCOUNTER — Other Ambulatory Visit: Payer: Self-pay

## 2020-04-26 ENCOUNTER — Encounter: Payer: Self-pay | Admitting: Sports Medicine

## 2020-04-26 ENCOUNTER — Ambulatory Visit: Payer: Managed Care, Other (non HMO) | Admitting: Sports Medicine

## 2020-04-26 DIAGNOSIS — L600 Ingrowing nail: Secondary | ICD-10-CM | POA: Diagnosis not present

## 2020-04-26 DIAGNOSIS — Z9889 Other specified postprocedural states: Secondary | ICD-10-CM | POA: Diagnosis not present

## 2020-04-26 DIAGNOSIS — M79675 Pain in left toe(s): Secondary | ICD-10-CM

## 2020-04-26 DIAGNOSIS — L03031 Cellulitis of right toe: Secondary | ICD-10-CM

## 2020-04-26 MED ORDER — AMOXICILLIN-POT CLAVULANATE 875-125 MG PO TABS: 1.0000 | ORAL_TABLET | Freq: Two times a day (BID) | ORAL | 0 refills | Status: DC

## 2020-04-26 MED ORDER — FLUCONAZOLE 150 MG PO TABS: 150.0000 mg | ORAL_TABLET | Freq: Once | ORAL | 0 refills | Status: AC

## 2020-04-26 NOTE — Progress Notes (Signed)
Subjective: Krista Hogan is a 40 y.o. female patient returns to office today for follow up evaluation after having Left Hallux medial/lateral permanent nail avulsion performed on (04/12/20). Patient has been soaking using epsom salt and applying topical antibiotic covered with bandaid daily but has been painful wearing a surgical shoe. Patient deniesfever/chills/nausea/vomitting/any other related constitutional symptoms at this time except now pain at the right hallux lateral nail margin that she wants to have checked.  Patient Active Problem List   Diagnosis Date Noted  . Right lower quadrant pain 03/29/2020  . Chronic idiopathic constipation 03/29/2020  . Acute anal fissure 03/29/2020  . Irritant dermatitis 03/21/2020  . Itching of both hands 12/27/2019  . Acne scarring 11/02/2019  . Acne vulgaris 11/02/2019  . Hyperpigmentation 11/02/2019  . Gastroesophageal reflux disease without esophagitis 08/29/2019  . Globus pharyngeus 08/29/2019  . Nasal turbinate hypertrophy 08/29/2019  . Rhinitis, chronic 08/29/2019  . Throat irritation 08/29/2019  . Palpitations 02/25/2019  . Dysesthesia 02/01/2019  . Groin pain 02/01/2019  . Obesity 02/01/2019  . Postpartum hypertension 01/10/2018  . S/P repeat low transverse C-section 01/06/2018  . Supervision of other normal pregnancy, antepartum 07/08/2017  . Graves disease 10/01/2016  . Genital herpes simplex 07/29/2016  . Cyst of ovary 07/28/2016  . Genital warts 07/28/2016  . 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 Migraine 07/28/2016  . Hypothyroidism 12/17/2015  . Mucosal irritation of oral cavity 12/17/2015  . Otalgia, right 12/17/2015  . Chronic pelvic pain in female 11/15/2015  . 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    Current Outpatient Medications on File Prior to Visit  Medication Sig Dispense Refill  . acetaminophen (TYLENOL) 500 MG tablet Take 500 mg by mouth every 6 (six) hours as needed for moderate pain.    .  cetirizine (ZYRTEC) 10 MG tablet Take 1 tablet (10 mg total) by mouth daily. 30 tablet 11  . cholecalciferol (VITAMIN D) 1000 units tablet Take 1,000 Units by mouth daily.    . clindamycin-benzoyl peroxide (BENZACLIN) gel Apply 1 application topically every morning.    . cyanocobalamin 1000 MCG tablet Take 1,000 mcg by mouth daily.    . hydroquinone 4 % cream Apply to face nightly to every other night with tretinoin    . ibuprofen (ADVIL) 800 MG tablet Take 1 tablet (800 mg total) by mouth every 8 (eight) hours as needed. 30 tablet 5  . neomycin-polymyxin-hydrocortisone (CORTISPORIN) OTIC solution Apply 1-2 drops to toenail after soaking and then cover with bandaid 10 mL 0  . norethindrone-ethinyl estradiol (CYCLAFEM) 0.5/0.75/1-35 MG-MCG tablet Take by mouth.    . norethindrone-ethinyl estradiol 1/35 ([place] 1/35, 28,) tablet Take 1 tablet by mouth daily. 1 Package 11  . Omega-3 Fatty Acids (FISH OIL) 1000 MG CAPS Take 1,000 mg by mouth daily.     Marland Kitchen SPIRONOLACTONE PO Take by mouth daily.    . tacrolimus (PROTOPIC) 0.1 % ointment Apply to neck daily to twice daily as needed.    . tretinoin (RETIN-A) 0.025 % cream Apply to face nightly to tolerance    . triamcinolone cream (KENALOG) 0.5 % Apply topically.     No current facility-administered medications on file prior to visit.    Allergies  Allergen Reactions  . Minocycline Rash    Objective:  General: Well developed, nourished, in no acute distress, alert and oriented x3   Dermatology: Skin is warm, dry and supple bilateral. Left hallux medial/lateral nail bed appears to be clean, dry, with mild granular tissue and surrounding  eschar/scab. (+) maceration from over medication (-) Erythema. (-) Edema. (+) serosanguous drainage present for the right 1st toe when the lateral nail margin was trimmed but nothing from the left. The remaining nails appear unremarkable at this time. There are no other lesions or other signs of infection   present.  Neurovascular status: Intact. No lower extremity swelling; No pain with calf compression bilateral.  Musculoskeletal: Decreased tenderness to palpation of the left hallux nail fold(s) but there is increased pain to right hallux lateral margin. Muscular strength within normal limits bilateral.   Assesement and Plan: Problem List Items Addressed This Visit   None   Visit Diagnoses    Pain of toe of left foot    -  Primary   Ingrown nail       S/P nail surgery       Paronychia of great toe, right       Relevant Medications   fluconazole (DIFLUCAN) 150 MG tablet      -Examined patient  -Cleansed left hallux medial/lateral nail fold and gently scrubbed with peroxide and q-tip/curetted away eschar at site and applied antibiotic cream covered with bandaid.  -Discussed plan of care with patient. -Patient to now begin soaking in a weak solution of Epsom salt and warm water. Patient was instructed to soak for 15-20 minutes each day until the toe appears normal and there is no drainage, redness, tenderness, or swelling at the procedure site, and apply neosporin and a gauze or bandaid dressing each day as needed. May leave open to air at night. -Educated patient on long term care after nail surgery. -Continue with surgical shoe -Using a sterile nail nipper mechanically debrided right hallux lateral nail border upon debridement there was some purulent drainage noted applied antibiotic cream and Band-Aid and advised patient to soak with Epson salt as above and to get Corticosporin drops from her pharmacy of which I prescribed previously to use and to pick up Augmentin antibiotic to take as instructed -Patient was instructed to monitor the toes for reoccurrence and signs of infection; Patient advised to return to office or go to ER if toe becomes red, hot or swollen. -Patient is to return in 2 weeks or sooner if problems arise.  Landis Martins, DPM

## 2020-04-27 ENCOUNTER — Other Ambulatory Visit: Payer: Self-pay | Admitting: Sports Medicine

## 2020-04-27 ENCOUNTER — Telehealth: Payer: Self-pay | Admitting: Sports Medicine

## 2020-04-27 MED ORDER — TRAMADOL HCL 50 MG PO TABS
50.0000 mg | ORAL_TABLET | Freq: Three times a day (TID) | ORAL | 0 refills | Status: AC | PRN
Start: 2020-04-27 — End: 2020-05-02

## 2020-04-27 NOTE — Telephone Encounter (Signed)
Patient has requested refill on pain meds, Please Advise 

## 2020-04-27 NOTE — Progress Notes (Signed)
Refilled pain medication

## 2020-05-10 ENCOUNTER — Encounter: Payer: Self-pay | Admitting: Sports Medicine

## 2020-05-10 ENCOUNTER — Other Ambulatory Visit: Payer: Self-pay

## 2020-05-10 ENCOUNTER — Ambulatory Visit: Payer: Managed Care, Other (non HMO) | Admitting: Sports Medicine

## 2020-05-10 DIAGNOSIS — Z9889 Other specified postprocedural states: Secondary | ICD-10-CM

## 2020-05-10 DIAGNOSIS — L600 Ingrowing nail: Secondary | ICD-10-CM

## 2020-05-10 DIAGNOSIS — M79675 Pain in left toe(s): Secondary | ICD-10-CM

## 2020-05-10 DIAGNOSIS — M79674 Pain in right toe(s): Secondary | ICD-10-CM

## 2020-05-10 NOTE — Progress Notes (Signed)
Subjective: Krista Hogan is a 40 y.o. female patient returns to office today for follow up evaluation after having Left Hallux medial/lateral permanent nail avulsion performed on (04/12/20).  Patient reports that finally the left toes doing better but has some soreness still on her right toe.  Still unable to wear shoe completely due to when she puts on a shoe and there is pressure to the entire nail it hurts but no longer hurts on the sides.  Patient Active Problem List   Diagnosis Date Noted  . Right lower quadrant pain 03/29/2020  . Chronic idiopathic constipation 03/29/2020  . Acute anal fissure 03/29/2020  . Irritant dermatitis 03/21/2020  . Itching of both hands 12/27/2019  . Acne scarring 11/02/2019  . Acne vulgaris 11/02/2019  . Hyperpigmentation 11/02/2019  . Gastroesophageal reflux disease without esophagitis 08/29/2019  . Globus pharyngeus 08/29/2019  . Nasal turbinate hypertrophy 08/29/2019  . Rhinitis, chronic 08/29/2019  . Throat irritation 08/29/2019  . Palpitations 02/25/2019  . Dysesthesia 02/01/2019  . Groin pain 02/01/2019  . Obesity 02/01/2019  . Postpartum hypertension 01/10/2018  . S/P repeat low transverse C-section 01/06/2018  . Supervision of other normal pregnancy, antepartum 07/08/2017  . Graves disease 10/01/2016  . Genital herpes simplex 07/29/2016  . Cyst of ovary 07/28/2016  . Genital warts 07/28/2016  . 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 Migraine 07/28/2016  . Hypothyroidism 12/17/2015  . Mucosal irritation of oral cavity 12/17/2015  . Otalgia, right 12/17/2015  . Chronic pelvic pain in female 11/15/2015  . 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    Current Outpatient Medications on File Prior to Visit  Medication Sig Dispense Refill  . acetaminophen (TYLENOL) 500 MG tablet Take 500 mg by mouth every 6 (six) hours as needed for moderate pain.    Marland Kitchen amoxicillin-clavulanate (AUGMENTIN) 875-125 MG tablet Take 1 tablet by mouth 2  (two) times daily. 28 tablet 0  . cetirizine (ZYRTEC) 10 MG tablet Take 1 tablet (10 mg total) by mouth daily. 30 tablet 11  . cholecalciferol (VITAMIN D) 1000 units tablet Take 1,000 Units by mouth daily.    . clindamycin-benzoyl peroxide (BENZACLIN) gel Apply 1 application topically every morning.    . cyanocobalamin 1000 MCG tablet Take 1,000 mcg by mouth daily.    . hydroquinone 4 % cream Apply to face nightly to every other night with tretinoin    . ibuprofen (ADVIL) 800 MG tablet Take 1 tablet (800 mg total) by mouth every 8 (eight) hours as needed. 30 tablet 5  . neomycin-polymyxin-hydrocortisone (CORTISPORIN) OTIC solution Apply 1-2 drops to toenail after soaking and then cover with bandaid 10 mL 0  . norethindrone-ethinyl estradiol (CYCLAFEM) 0.5/0.75/1-35 MG-MCG tablet Take by mouth.    . norethindrone-ethinyl estradiol 1/35 ([place] 1/35, 28,) tablet Take 1 tablet by mouth daily. 1 Package 11  . Omega-3 Fatty Acids (FISH OIL) 1000 MG CAPS Take 1,000 mg by mouth daily.     Marland Kitchen SPIRONOLACTONE PO Take by mouth daily.    . tacrolimus (PROTOPIC) 0.1 % ointment Apply to neck daily to twice daily as needed.    . tretinoin (RETIN-A) 0.025 % cream Apply to face nightly to tolerance    . triamcinolone cream (KENALOG) 0.5 % Apply topically.     No current facility-administered medications on file prior to visit.    Allergies  Allergen Reactions  . Minocycline Rash    Objective:  General: Well developed, nourished, in no acute distress, alert and oriented x3   Dermatology: Skin  is warm, dry and supple bilateral. Left hallux medial/lateral nail bed appears to be clean, dry, with mild granular tissue and surrounding eschar/scab.  (-) Erythema. (-) Edema. (-) serosanguous drainage present.  Right toe medial lateral borders are less tender this visit.  The remaining nails appear unremarkable at this time. There are no other lesions or other signs of infection present.  Neurovascular status:  Intact. No lower extremity swelling; No pain with calf compression bilateral.  Musculoskeletal: Decreased tenderness to palpation of the left and right hallux and medial hallux nail folds.  Assesement and Plan: Problem List Items Addressed This Visit   None   Visit Diagnoses    Pain of toe of left foot    -  Primary   Ingrown nail       S/P nail surgery       Toe pain, bilateral         -Examined patient  -Cleansed left hallux and applied Band-Aid -Continue with soaking for at least 1 more week and use of antibiotic drops to both toes left and right advised patient to continue to monitor right and if worsen to return to office -Continue with surgical shoe until she can tolerate a normal shoe -Patient is to return if fails to continue to improve or sooner if problems arise.  Landis Martins, DPM

## 2020-06-11 ENCOUNTER — Encounter: Payer: Self-pay | Admitting: Sports Medicine

## 2020-06-19 ENCOUNTER — Other Ambulatory Visit: Payer: Self-pay

## 2020-06-19 ENCOUNTER — Ambulatory Visit: Payer: Managed Care, Other (non HMO) | Admitting: Sports Medicine

## 2020-06-19 ENCOUNTER — Encounter: Payer: Self-pay | Admitting: Sports Medicine

## 2020-06-19 DIAGNOSIS — M79674 Pain in right toe(s): Secondary | ICD-10-CM

## 2020-06-19 DIAGNOSIS — L600 Ingrowing nail: Secondary | ICD-10-CM | POA: Diagnosis not present

## 2020-06-19 MED ORDER — TRAMADOL HCL 50 MG PO TABS: 50.0000 mg | ORAL_TABLET | Freq: Three times a day (TID) | ORAL | 0 refills | Status: DC | PRN

## 2020-06-19 MED ORDER — NEOMYCIN-POLYMYXIN-HC 3.5-10000-1 OT SOLN: OTIC | 0 refills | Status: DC

## 2020-06-19 NOTE — Patient Instructions (Signed)

## 2020-06-19 NOTE — Progress Notes (Signed)
Subjective: Krista Hogan is a 39 y.o. female patient presents to office today complaining of a moderately painful right hallux toenail that is so reports that she has been soaking and using drops and getting pedicures every now and again but still having problems with the right.  Patient reports that she is afraid to trim her toenails on the left.  No other pedal complaints noted.  Patient Active Problem List   Diagnosis Date Noted  . Right lower quadrant pain 03/29/2020  . Chronic idiopathic constipation 03/29/2020  . Acute anal fissure 03/29/2020  . Irritant dermatitis 03/21/2020  . Itching of both hands 12/27/2019  . Acne scarring 11/02/2019  . Acne vulgaris 11/02/2019  . Hyperpigmentation 11/02/2019  . Gastroesophageal reflux disease without esophagitis 08/29/2019  . Globus pharyngeus 08/29/2019  . Nasal turbinate hypertrophy 08/29/2019  . Rhinitis, chronic 08/29/2019  . Throat irritation 08/29/2019  . Palpitations 02/25/2019  . Dysesthesia 02/01/2019  . Groin pain 02/01/2019  . Obesity 02/01/2019  . Postpartum hypertension 01/10/2018  . S/P repeat low transverse C-section 01/06/2018  . Supervision of other normal pregnancy, antepartum 07/08/2017  . Graves disease 10/01/2016  . Genital herpes simplex 07/29/2016  . Cyst of ovary 07/28/2016  . Genital warts 07/28/2016  . 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 Migraine 07/28/2016  . Hypothyroidism 12/17/2015  . Mucosal irritation of oral cavity 12/17/2015  . Otalgia, right 12/17/2015  . Chronic pelvic pain in female 11/15/2015  . 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    Current Outpatient Medications on File Prior to Visit  Medication Sig Dispense Refill  . acetaminophen (TYLENOL) 500 MG tablet Take 500 mg by mouth every 6 (six) hours as needed for moderate pain.    Marland Kitchen amoxicillin-clavulanate (AUGMENTIN) 875-125 MG tablet Take 1 tablet by mouth 2 (two) times daily. 28 tablet 0  . cetirizine (ZYRTEC) 10 MG tablet  Take 1 tablet (10 mg total) by mouth daily. 30 tablet 11  . cholecalciferol (VITAMIN D) 1000 units tablet Take 1,000 Units by mouth daily.    . clindamycin-benzoyl peroxide (BENZACLIN) gel Apply 1 application topically every morning.    . cyanocobalamin 1000 MCG tablet Take 1,000 mcg by mouth daily.    . hydroquinone 4 % cream Apply to face nightly to every other night with tretinoin    . ibuprofen (ADVIL) 800 MG tablet Take 1 tablet (800 mg total) by mouth every 8 (eight) hours as needed. 30 tablet 5  . norethindrone-ethinyl estradiol (CYCLAFEM) 0.5/0.75/1-35 MG-MCG tablet Take by mouth.    . norethindrone-ethinyl estradiol 1/35 ([place] 1/35, 28,) tablet Take 1 tablet by mouth daily. 1 Package 11  . Omega-3 Fatty Acids (FISH OIL) 1000 MG CAPS Take 1,000 mg by mouth daily.     Marland Kitchen SPIRONOLACTONE PO Take by mouth daily.    . tacrolimus (PROTOPIC) 0.1 % ointment Apply to neck daily to twice daily as needed.    . tretinoin (RETIN-A) 0.025 % cream Apply to face nightly to tolerance    . triamcinolone cream (KENALOG) 0.5 % Apply topically.     No current facility-administered medications on file prior to visit.    Allergies  Allergen Reactions  . Minocycline Rash    Objective:  There were no vitals filed for this visit.  General: Well developed, nourished, in no acute distress, alert and oriented x3   Dermatology: Skin is warm, dry and supple bilateral.  Right hallux nail appears to be severely incurvated with hyperkeratosis formation at the distal aspects of  the medial and lateral nail border. (-) Erythema. (+) Edema. (-) serosanguous  drainage present. The remaining nails appear unremarkable at this time. There are no open sores, lesions or other signs of infection  present.  Previous procedure site on left hallux well-healed.  Vascular: Dorsalis Pedis artery and Posterior Tibial artery pedal pulses are 2/4 bilateral with immedate capillary fill time. Pedal hair growth present. No  lower extremity edema.   Neruologic: Grossly intact via light touch bilateral.  Musculoskeletal: Tenderness to palpation of the right hallux medial and lateral nail fold(s). Muscular strength within normal limits in all groups bilateral.   Assesement and Plan: Problem List Items Addressed This Visit   None   Visit Diagnoses    Ingrown nail    -  Primary   Toe pain, right          -Discussed treatment alternatives and plan of care; Explained permanent/temporary nail avulsion and post procedure course to patient. Patient elects for PNA right hallux medial and lateral nail fold with phenol - After a verbal and written consent, injected 3 ml of a 50:50 mixture of 2% plain  lidocaine and 0.5% plain marcaine in a normal hallux block fashion. Next, a  betadine prep was performed. Anesthesia was tested and found to be appropriate.  The offending right hallux medial and lateral nail border was then incised from the hyponychium to the epinychium. The offending nail border was removed and cleared from the field. The area was curretted for any remaining nail or spicules. Phenol application performed and the area was then flushed with alcohol and dressed with antibiotic cream and a dry sterile dressing. -Patient was instructed to leave the dressing intact for today and begin soaking  in a weak solution of betadine or Epsom salt and water tomorrow. Patient was instructed to  soak for 15-20 minutes each day and apply neosporin/corticosporin and a gauze or bandaid dressing each day. -Prescribed tramadol for pain -Patient was instructed to monitor the toe for signs of infection and return to office if toe becomes red, hot or swollen. -Advised ice, elevation, and tylenol or motrin if needed for pain.  -Patient is to return in 2 weeks for follow up care/nail check or sooner if problems arise.  Landis Martins, DPM

## 2020-07-03 ENCOUNTER — Other Ambulatory Visit: Payer: Self-pay | Admitting: Sports Medicine

## 2020-07-03 ENCOUNTER — Other Ambulatory Visit: Payer: Self-pay | Admitting: Obstetrics

## 2020-07-03 DIAGNOSIS — N944 Primary dysmenorrhea: Secondary | ICD-10-CM

## 2020-07-04 ENCOUNTER — Ambulatory Visit: Payer: Managed Care, Other (non HMO) | Admitting: Sports Medicine

## 2020-07-04 NOTE — Telephone Encounter (Signed)
Please advise 

## 2020-07-10 ENCOUNTER — Ambulatory Visit: Payer: Managed Care, Other (non HMO) | Admitting: Sports Medicine

## 2020-07-11 ENCOUNTER — Encounter: Payer: Self-pay | Admitting: Sports Medicine

## 2020-07-11 ENCOUNTER — Ambulatory Visit: Payer: Managed Care, Other (non HMO) | Admitting: Sports Medicine

## 2020-07-11 ENCOUNTER — Other Ambulatory Visit: Payer: Self-pay

## 2020-07-11 DIAGNOSIS — M79674 Pain in right toe(s): Secondary | ICD-10-CM

## 2020-07-11 DIAGNOSIS — Z9889 Other specified postprocedural states: Secondary | ICD-10-CM

## 2020-07-11 NOTE — Progress Notes (Signed)
Subjective: Krista Hogan is a 40 y.o. female patient returns to office today for follow up evaluation after having Right hallux medial and lateral permanent/temporary nail avulsion performed on 06/19/20. Patient has been soaking using epsom salt and applying topical antibiotic covered with bandaid daily. Patient denies fever/chills/nausea/vomitting/any other related constitutional symptoms at this time.  Patient Active Problem List   Diagnosis Date Noted  . Right lower quadrant pain 03/29/2020  . Chronic idiopathic constipation 03/29/2020  . Acute anal fissure 03/29/2020  . Irritant dermatitis 03/21/2020  . Itching of both hands 12/27/2019  . Acne scarring 11/02/2019  . Acne vulgaris 11/02/2019  . Hyperpigmentation 11/02/2019  . Gastroesophageal reflux disease without esophagitis 08/29/2019  . Globus pharyngeus 08/29/2019  . Nasal turbinate hypertrophy 08/29/2019  . Rhinitis, chronic 08/29/2019  . Throat irritation 08/29/2019  . Palpitations 02/25/2019  . Dysesthesia 02/01/2019  . Groin pain 02/01/2019  . Obesity 02/01/2019  . Postpartum hypertension 01/10/2018  . S/P repeat low transverse C-section 01/06/2018  . Supervision of other normal pregnancy, antepartum 07/08/2017  . Graves disease 10/01/2016  . Genital herpes simplex 07/29/2016  . Cyst of ovary 07/28/2016  . Genital warts 07/28/2016  . 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 Migraine 07/28/2016  . Hypothyroidism 12/17/2015  . Mucosal irritation of oral cavity 12/17/2015  . Otalgia, right 12/17/2015  . Chronic pelvic pain in female 11/15/2015  . 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    Current Outpatient Medications on File Prior to Visit  Medication Sig Dispense Refill  . acetaminophen (TYLENOL) 500 MG tablet Take 500 mg by mouth every 6 (six) hours as needed for moderate pain.    Marland Kitchen amoxicillin-clavulanate (AUGMENTIN) 875-125 MG tablet Take 1 tablet by mouth 2 (two) times daily. 28 tablet 0  . cetirizine  (ZYRTEC) 10 MG tablet Take 1 tablet (10 mg total) by mouth daily. 30 tablet 11  . cholecalciferol (VITAMIN D) 1000 units tablet Take 1,000 Units by mouth daily.    . clindamycin-benzoyl peroxide (BENZACLIN) gel Apply 1 application topically every morning.    . cyanocobalamin 1000 MCG tablet Take 1,000 mcg by mouth daily.    . hydroquinone 4 % cream Apply to face nightly to every other night with tretinoin    . ibuprofen (ADVIL) 800 MG tablet TAKE 1 TABLET BY MOUTH EVERY 8 HOURS AS NEEDED 30 tablet 5  . neomycin-polymyxin-hydrocortisone (CORTISPORIN) OTIC solution Apply 1-2 drops to toenail after soaking and then cover with bandaid 10 mL 0  . norethindrone-ethinyl estradiol (CYCLAFEM) 0.5/0.75/1-35 MG-MCG tablet Take by mouth.    . norethindrone-ethinyl estradiol 1/35 ([place] 1/35, 28,) tablet Take 1 tablet by mouth daily. 1 Package 11  . Omega-3 Fatty Acids (FISH OIL) 1000 MG CAPS Take 1,000 mg by mouth daily.     Marland Kitchen SPIRONOLACTONE PO Take by mouth daily.    . tacrolimus (PROTOPIC) 0.1 % ointment Apply to neck daily to twice daily as needed.    . traMADol (ULTRAM) 50 MG tablet TAKE 1 TABLET BY MOUTH EVERY 8 HOURS AS NEEDED FOR  UP  TO  5  DAYS 15 tablet 0  . tretinoin (RETIN-A) 0.025 % cream Apply to face nightly to tolerance    . triamcinolone cream (KENALOG) 0.5 % Apply topically.     No current facility-administered medications on file prior to visit.    Allergies  Allergen Reactions  . Minocycline Rash    Objective:  General: Well developed, nourished, in no acute distress, alert and oriented x3   Dermatology:  Skin is warm, dry and supple bilateral. Right hallux medial/lateral nail bed appears to be clean, dry, with mild granular tissue and surrounding eschar/scab. (-) Erythema. (-) Edema. (-) serosanguous drainage present. The remaining nails appear unremarkable at this time. There are no other lesions or other signs of infection  present.  Neurovascular status: Intact. No  lower extremity swelling; No pain with calf compression bilateral.  Musculoskeletal: Decreased tenderness to palpation of the Right hallux nail fold(s). Occasional pain to left toe with work shoes.  Muscular strength within normal limits bilateral.   Assesement and Plan: Problem List Items Addressed This Visit   None   Visit Diagnoses    S/P nail surgery    -  Primary   Toe pain, right          -Examined patient  -Cleansed right hallux medial and lateral nail fold and gently scrubbed with peroxide and applied antibiotic cream covered with bandaid.  -Discussed plan of care with patient. -Patient to now begin soaking in a weak solution of Epsom salt and warm water. Patient was instructed to soak for 15-20 minutes each day until the toe appears normal and there is no drainage, redness, tenderness, or swelling at the procedure site, and apply neosporin and a gauze or bandaid dressing each day as needed. May leave open to air at night. If after a week may switch to betadine. -Educated patient on long term care after nail surgery. -Patient was instructed to monitor the toe for reoccurrence and signs of infection; Patient advised to return to office or go to ER if toe becomes red, hot or swollen. -Patient is to return as needed or sooner if problems arise.  Landis Martins, DPM

## 2020-07-19 ENCOUNTER — Other Ambulatory Visit: Payer: Self-pay | Admitting: Sports Medicine

## 2020-07-20 NOTE — Telephone Encounter (Signed)
Please advise. Dr. Cannon Kettle is out of town

## 2020-07-26 ENCOUNTER — Other Ambulatory Visit: Payer: Self-pay | Admitting: Obstetrics

## 2020-07-26 DIAGNOSIS — Z30011 Encounter for initial prescription of contraceptive pills: Secondary | ICD-10-CM

## 2020-08-02 ENCOUNTER — Other Ambulatory Visit: Payer: Self-pay

## 2020-08-02 ENCOUNTER — Other Ambulatory Visit: Payer: Self-pay | Admitting: Obstetrics

## 2020-08-02 ENCOUNTER — Ambulatory Visit (INDEPENDENT_AMBULATORY_CARE_PROVIDER_SITE_OTHER): Payer: Managed Care, Other (non HMO) | Admitting: Obstetrics

## 2020-08-02 ENCOUNTER — Encounter: Payer: Self-pay | Admitting: Obstetrics

## 2020-08-02 VITALS — BP 114/79 | HR 64 | Ht 63.0 in | Wt 222.0 lb

## 2020-08-02 DIAGNOSIS — N944 Primary dysmenorrhea: Secondary | ICD-10-CM

## 2020-08-02 DIAGNOSIS — Z3041 Encounter for surveillance of contraceptive pills: Secondary | ICD-10-CM

## 2020-08-02 DIAGNOSIS — Z01419 Encounter for gynecological examination (general) (routine) without abnormal findings: Secondary | ICD-10-CM

## 2020-08-02 DIAGNOSIS — D251 Intramural leiomyoma of uterus: Secondary | ICD-10-CM

## 2020-08-02 DIAGNOSIS — Z113 Encounter for screening for infections with a predominantly sexual mode of transmission: Secondary | ICD-10-CM

## 2020-08-02 DIAGNOSIS — N943 Premenstrual tension syndrome: Secondary | ICD-10-CM | POA: Diagnosis not present

## 2020-08-02 DIAGNOSIS — N898 Other specified noninflammatory disorders of vagina: Secondary | ICD-10-CM

## 2020-08-02 MED ORDER — PAROXETINE HCL 20 MG PO TABS: 20.0000 mg | ORAL_TABLET | Freq: Every day | ORAL | 11 refills | Status: DC

## 2020-08-02 NOTE — Progress Notes (Signed)
Subjective:        Krista Hogan is a 40 y.o. female here for a routine exam.  Current complaints: Painful periods, normal flow and duration.  Very concerned about the extreme change in her personality around period time only, where she is extremely irritable and mean, and just can't stand to be around people.  Personal health questionnaire:  Is patient Ashkenazi Jewish, have a family history of breast and/or ovarian cancer: no Is there a family history of uterine cancer diagnosed at age < 61, gastrointestinal cancer, urinary tract cancer, family member who is a Field seismologist syndrome-associated carrier: no Is the patient overweight and hypertensive, family history of diabetes, personal history of gestational diabetes, preeclampsia or PCOS: no Is patient over 18, have PCOS,  family history of premature CHD under age 44, diabetes, smoke, have hypertension or peripheral artery disease:  no At any time, has a partner hit, kicked or otherwise hurt or frightened you?: no Over the past 2 weeks, have you felt down, depressed or hopeless?: no Over the past 2 weeks, have you felt little interest or pleasure in doing things?:no   Gynecologic History Patient's last menstrual period was 07/26/2020. Contraception: OCP (estrogen/progesterone) Last Pap: 2020. Results were: LGSIL.  Colposcopic biopsies revealed LGSIL Last mammogram: n/a. Results were: n/a  Obstetric History OB History  Gravida Para Term Preterm AB Living  2 1 1  0 1 1  SAB IAB Ectopic Multiple Live Births  1 0 0 0 1    # Outcome Date GA Lbr Len/2nd Weight Sex Delivery Anes PTL Lv  2 Term 01/06/18 [redacted]w[redacted]d  6 lb 1.4 oz (2.76 kg) F CS-LTranv Spinal  LIV  1 SAB 04/22/17     SAB       Past Medical History:  Diagnosis Date  . DUB (dysfunctional uterine bleeding)   . Graves disease 06/2016  . History of abnormal cervical Pap smear    2011  . History of HPV infection   . Uterine fibroid   . Wears glasses     Past Surgical History:   Procedure Laterality Date  . CESAREAN SECTION N/A 01/06/2018   Procedure: CESAREAN SECTION;  Surgeon: Caren Macadam, MD;  Location: [place];  Service: Obstetrics;  Laterality: N/A;  . EXCISION OF SKIN TAG  11/22/2014   Procedure: EXCISION OF SKIN TAG VULVA;  Surgeon: Governor Specking, MD;  Location: [place];  Service: Gynecology;;  . LAPAROSCOPIC GELPORT ASSISTED MYOMECTOMY N/A 11/22/2014   Procedure: LAPAROSCOPIC GELPORT ASSISTED MYOMECTOMY WITH CHROMOTUBATION;  Surgeon: Governor Specking, MD;  Location: [place];  Service: Gynecology;  Laterality: N/A;  . LAPAROSCOPY N/A 11/22/2014   Procedure: LAPAROSCOPY DIAGNOSTIC;  Surgeon: Governor Specking, MD;  Location: [facility];  Service: Gynecology;  Laterality: N/A;  . LEEP  2011  . TONSILLECTOMY AND ADENOIDECTOMY  age 85  . WISDOM TOOTH EXTRACTION  2006     Current Outpatient Medications:  .  cetirizine (ZYRTEC) 10 MG tablet, Take 1 tablet (10 mg total) by mouth daily., Disp: 30 tablet, Rfl: 11 .  cholecalciferol (VITAMIN D) 1000 units tablet, Take 1,000 Units by mouth daily., Disp: , Rfl:  .  clindamycin-benzoyl peroxide (BENZACLIN) gel, Apply 1 application topically every morning., Disp: , Rfl:  .  cyanocobalamin 1000 MCG tablet, Take 1,000 mcg by mouth daily., Disp: , Rfl:  .  hydroquinone 4 % cream, Apply to face nightly to every other night with tretinoin, Disp: , Rfl:  .  ibuprofen (ADVIL) 800 MG tablet, TAKE 1 TABLET BY MOUTH EVERY 8 HOURS AS NEEDED, Disp: 30 tablet, Rfl: 5 .  neomycin-polymyxin-hydrocortisone (CORTISPORIN) OTIC solution, Apply 1-2 drops to toenail after soaking and then cover with bandaid, Disp: 10 mL, Rfl: 0 .  [place] 1/35, 28, tablet, Take 1 tablet by mouth once daily, Disp: 28 tablet, Rfl: 0 .  Omega-3 Fatty Acids (FISH OIL) 1000 MG CAPS, Take 1,000 mg by mouth daily. , Disp: , Rfl:  .  PARoxetine (PAXIL) 20 MG tablet, Take 1 tablet (20 mg total)  by mouth daily., Disp: 30 tablet, Rfl: 11 .  SPIRONOLACTONE PO, Take by mouth daily., Disp: , Rfl:  .  tacrolimus (PROTOPIC) 0.1 % ointment, Apply to neck daily to twice daily as needed., Disp: , Rfl:  .  tretinoin (RETIN-A) 0.025 % cream, Apply to face nightly to tolerance, Disp: , Rfl:  .  acetaminophen (TYLENOL) 500 MG tablet, Take 500 mg by mouth every 6 (six) hours as needed for moderate pain. (Patient not taking: Reported on 08/02/2020), Disp: , Rfl:  .  traMADol (ULTRAM) 50 MG tablet, TAKE 1 TABLET BY MOUTH EVERY 8 HOURS AS NEEDED FOR  UP  TO  5  DAYS (Patient not taking: Reported on 08/02/2020), Disp: 15 tablet, Rfl: 0 .  triamcinolone cream (KENALOG) 0.5 %, Apply topically. (Patient not taking: Reported on 08/02/2020), Disp: , Rfl:  Allergies  Allergen Reactions  . Minocycline Rash    Social History   Tobacco Use  . Smoking status: Former Smoker    Packs/day: 0.25    Years: 10.00    Pack years: 2.50    Types: Cigarettes    Quit date: 04/2017    Years since quitting: 3.3  . Smokeless tobacco: Never Used  Substance Use Topics  . Alcohol use: Yes    Alcohol/week: 0.0 standard drinks    Comment: SOCIAL    Family History  Problem Relation Age of Onset  . Fibroids Mother   . Thyroid disease Neg Hx       Review of Systems  Constitutional: negative for fatigue and weight loss Respiratory: negative for cough and wheezing Cardiovascular: negative for chest pain, fatigue and palpitations Gastrointestinal: negative for abdominal pain and change in bowel habits Musculoskeletal:negative for myalgias Neurological: negative for gait problems and tremors Behavioral/Psych: positive for extreme irritability and meanness around period time only Endocrine: negative for temperature intolerance    Genitourinary: positive for painful periods and vaginal discharge.  Negative for genital lesions, hot flashes, sexual problems  Integument/breast: negative for breast lump, breast tenderness,  nipple discharge and skin lesion(s)    Objective:       BP 114/79   Pulse 64   Ht 5\' 3"  (1.6 m)   Wt 222 lb (100.7 kg)   LMP 07/26/2020   BMI 39.33 kg/m  General:   alert and no distress  Skin:   no rash or abnormalities  Lungs:   clear to auscultation bilaterally  Heart:   regular rate and rhythm, S1, S2 normal, no murmur, click, rub or gallop  Breasts:   normal without suspicious masses, skin or nipple changes or axillary nodes  Abdomen:  normal findings: no organomegaly, soft, non-tender and no hernia  Pelvis:  External genitalia: normal general appearance Urinary system: urethral meatus normal and bladder without fullness, nontender Vaginal: normal without tenderness, induration or masses Cervix: normal appearance Adnexa: normal bimanual exam Uterus: anteverted and non-tender, normal size   Lab Review Urine pregnancy test Labs  reviewed yes Radiologic studies reviewed yes    Assessment:     1. Encounter for gynecological examination with Papanicolaou smear of cervix Rx: - IGP, Aptima HPV, rfx 16/18,45  2. Fibroids, intramural.  S/P myomectomy Rx: - US PELVIC COMPLETE WITH TRANSVAGINAL; Future  3. Primary dysmenorrhea - Ibuprofen 800 mg po scheduled q 8 hours every month during period time along with Tylenol  4. PMS (premenstrual syndrome) Rx: - PARoxetine (PAXIL) 20 MG tablet; Take 1 tablet (20 mg total) by mouth daily.  Dispense: 30 tablet; Refill: 11 - no caffeine around period time  5. Vaginal discharge Rx: - BV+Ct/Ng/Tv NAA+Yeast Culture  6. Screening for STD (sexually transmitted disease) Rx: - Hepatitis B surface antigen - HIV Antibody (routine testing w rflx) - Hepatitis C antibody - RPR  7. OCP Surveillance - continue OCP's    Plan:    Education reviewed: calcium supplements, depression evaluation, low fat, low cholesterol diet, safe sex/STD prevention, self breast exams and weight bearing exercise. Contraception: OCP  (estrogen/progesterone). Follow up in: 3 months.   Meds ordered this encounter  Medications  . PARoxetine (PAXIL) 20 MG tablet    Sig: Take 1 tablet (20 mg total) by mouth daily.    Dispense:  30 tablet    Refill:  11   Orders Placed This Encounter  Procedures  . US PELVIC COMPLETE WITH TRANSVAGINAL    Standing Status:   Future    Standing Expiration Date:   08/02/2021    Order Specific Question:   Reason for Exam (SYMPTOM  OR DIAGNOSIS REQUIRED)    Answer:   Fibroids    Order Specific Question:   Preferred imaging location?    Answer:   [facility]  . Hepatitis B surface antigen  . HIV Antibody (routine testing w rflx)  . Hepatitis C antibody  . RPR     Shelly Bombard, MD 08/02/2020 4:02 PM

## 2020-08-02 NOTE — Progress Notes (Signed)
Patient presents for painful cramps for the last 3-4 months. She states that she also has really bad PMS symptoms.   Last Pap 01/25/2019 LGSIL

## 2020-08-02 NOTE — Patient Instructions (Signed)
Premenstrual Syndrome Premenstrual syndrome (PMS) is a group of physical, emotional, and behavioral symptoms that affect women as part of their menstrual cycle. PMS occurs 1-2 weeks before the start of a woman's menstrual period and goes away a few days after menstrual bleeding begins. PMS can range from mild to severe. What are the causes? The exact cause of this condition is not known, but it seems to be related to hormone changes that happen before menstruation. What are the signs or symptoms? Symptoms of this condition often happen every month. They go away after your period starts. Physical symptoms of this condition include:  Bloating.  Breast pain or tenderness.  Headaches.  Extreme fatigue.  Backaches.  Swelling of the hands and feet.  Weight gain.  Hot flashes. Emotional symptoms of this condition include:  Mood swings.  Depression.  Angry or hostile outbursts.  Irritability.  Anxiety.  Crying spells. Behavioral symptoms include:  Food cravings or appetite changes.  Changes in sexual desire.  Confusion.  Social withdrawal.  Poor concentration. How is this diagnosed? This condition may be diagnosed based on a history of your symptoms. This condition is generally diagnosed if symptoms of PMS:  Are present in the 5 days before your period starts.  End within 4 days after your period starts.  Happen at least 3 months in a row.  Interfere with some of your normal activities. Other conditions that can cause some of these symptoms must be ruled out before PMS can be diagnosed. These include depression, anxiety, anemia, and thyroid problems. How is this treated? This condition may be treated by doing the following:  Maintaining a healthy lifestyle. This includes eating a well-balanced diet and exercising regularly.  Taking over-the-counter medicines that can help relieve symptoms, such as cramps, aches, pain, headaches, and breast tenderness. Follow  these instructions at home: Eating and drinking  Eat a well-balanced diet.  Avoid caffeine and alcohol.  Limit the amount of salt and salty foods you eat. This will help reduce bloating.  Drink enough fluid to keep your urine pale yellow.  Take a multivitamin if told to do so by your health care provider.   Lifestyle  Do not use any products that contain nicotine or tobacco. These products include cigarettes, chewing tobacco, and vaping devices, such as e-cigarettes. If you need help quitting, ask your health care provider.  Exercise regularly as suggested by your health care provider.  Get enough sleep. For most adults, this is 7-8 hours of sleep each night.  Practice relaxation techniques, such as yoga, tai chi, or meditation.  Find healthy ways to manage stress.   General instructions  For 2-3 months, write down your symptoms, whether they are mild to severe, and how long they last. This will help your health care provider choose the best treatment for you.  Take over-the-counter and prescription medicines only as told by your health care provider.  If you are using birth control pills (oral contraceptives), use them as told by your health care provider.   Contact a health care provider if:  Your symptoms get worse.  You develop new symptoms.  You have trouble doing your daily activities. Summary  Premenstrual syndrome (PMS) is a group of physical, emotional, and behavioral symptoms that affect women as part of their menstrual cycle.  PMS starts 1-2 weeks before the start of a woman's period and goes away a few days after the period starts.  PMS is treated by maintaining a healthy lifestyle and taking medicines   to relieve the symptoms. This information is not intended to replace advice given to you by your health care provider. Make sure you discuss any questions you have with your health care provider. Document Revised: 10/14/2019 Document Reviewed:  10/14/2019 Elsevier Patient Education  2021 [employer].

## 2020-08-03 LAB — HEPATITIS B SURFACE ANTIGEN: Hepatitis B Surface Ag: NEGATIVE

## 2020-08-03 LAB — RPR: RPR Ser Ql: NONREACTIVE

## 2020-08-03 LAB — HEPATITIS C ANTIBODY: Hep C Virus Ab: 0.1 s/co ratio (ref 0.0–0.9)

## 2020-08-03 LAB — HIV ANTIBODY (ROUTINE TESTING W REFLEX): HIV Screen 4th Generation wRfx: NONREACTIVE

## 2020-08-06 LAB — BV+CT/NG/TV NAA+YEAST CULTURE
Chlamydia by NAA: NEGATIVE
Gonococcus by NAA: NEGATIVE
Trich vag by NAA: NEGATIVE

## 2020-08-09 ENCOUNTER — Ambulatory Visit (HOSPITAL_BASED_OUTPATIENT_CLINIC_OR_DEPARTMENT_OTHER)
Admission: RE | Admit: 2020-08-09 | Discharge: 2020-08-09 | Disposition: A | Discharge status: Home / Self Care | Payer: Managed Care, Other (non HMO) | Source: Ambulatory Visit | Attending: Obstetrics | Admitting: Obstetrics

## 2020-08-09 ENCOUNTER — Other Ambulatory Visit: Payer: Self-pay

## 2020-08-09 DIAGNOSIS — D251 Intramural leiomyoma of uterus: Secondary | ICD-10-CM | POA: Insufficient documentation

## 2020-08-09 LAB — IGP, APTIMA HPV, RFX 16/18,45
HPV Aptima: NEGATIVE
PAP Smear Comment: 0

## 2020-08-14 ENCOUNTER — Other Ambulatory Visit: Payer: Self-pay | Admitting: Internal Medicine

## 2020-08-16 ENCOUNTER — Other Ambulatory Visit: Payer: Self-pay

## 2020-08-16 ENCOUNTER — Encounter: Payer: Self-pay | Admitting: Internal Medicine

## 2020-08-16 ENCOUNTER — Ambulatory Visit: Payer: Managed Care, Other (non HMO) | Admitting: Internal Medicine

## 2020-08-16 VITALS — BP 128/82 | HR 67 | Ht 63.0 in | Wt 216.8 lb

## 2020-08-16 DIAGNOSIS — E0500 Thyrotoxicosis with diffuse goiter without thyrotoxic crisis or storm: Secondary | ICD-10-CM | POA: Diagnosis not present

## 2020-08-16 LAB — TSH: TSH: 0.66 u[IU]/mL (ref 0.35–4.50)

## 2020-08-16 LAB — T4, FREE: Free T4: 0.87 ng/dL (ref 0.60–1.60)

## 2020-08-16 LAB — T3, FREE: T3, Free: 3.7 pg/mL (ref 2.3–4.2)

## 2020-08-16 NOTE — Patient Instructions (Signed)
Please stop at the lab.  Please come back for a follow-up appointment in 1 year.  

## 2020-08-16 NOTE — Progress Notes (Signed)
Patient ID: Krista Hogan, female   DOB: 06/09/1980, 40 y.o.   MRN: [id number]   This visit occurred during the SARS-CoV-2 public health emergency.  Safety protocols were in place, including screening questions prior to the visit, additional usage of staff PPE, and extensive cleaning of exam room while observing appropriate contact time as indicated for disinfecting solutions.   HPI  Krista Hogan is a 40 y.o.-year-old female, returning for f/u for Graves ds. She previously saw Dr. Loanne Drilling, last visit 11/2015. Last visit with me 1 year ago.  Interim hx: She is feeling well since last visit, without complaints.  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 She was Rx'ed MMI >> initially refused 2/2 concern for poss. weight gain   After I saw her, we checked labs which confirmed Graves' disease so he started the low-dose methimazole, 2.5 mg daily.  She initially developed headaches, but then she realized that this could have been due to allergies.  Due to the plans for pregnancy, we switched to a low-dose PTU, 50 mg daily.  We did discuss at that time that at the beginning of the second trimester, she will need to switch back to methimazole, but she did not contact me when she entered the second trimester, therefore, she continued PTU until set of labs in 09/2017.    Since the tests were excellent, we stopped PTU at that time.  She did well during the pregnancy but she was lost for follow-up between 10/2017-08/2018.  In 08/2018, labs were normal.    In 02/2019, TSH was slightly low and I advised her to restart methimazole 2.5 mg daily in 02/2019. She did not start as she forgot.  Afterwards, TFTs normalized so we did not have to restart methimazole  Review her TFTs: Lab Results  Component Value Date   TSH 1.15 08/22/2019   TSH 0.445 (L) 02/24/2019   TSH 0.44 08/23/2018   TSH 1.17 11/02/2017   TSH 1.28 10/07/2017   TSH 0.39 06/25/2017   TSH 0.50 01/01/2017    TSH 0.00 (L) 09/23/2016   TSH 0.01 (L) 10/20/2015   FREET4 0.83 08/22/2019   FREET4 1.24 02/24/2019   FREET4 0.83 08/23/2018   FREET4 0.65 11/02/2017   FREET4 0.62 10/07/2017   FREET4 0.72 06/25/2017   FREET4 0.73 01/01/2017   FREET4 1.05 09/23/2016   FREET4 2.0 (H) 10/20/2015  08/2016: TSH 0.011 (received after appt)  We diagnosed Graves' disease based on the disease course and elevated TSI antibodies.  These normalized at last check: Lab Results  Component Value Date   TSI 126 11/02/2017   TSI 273 (H) 09/23/2016   Pt denies: - feeling nodules in neck - hoarseness - choking - SOB with lying down But she has occasional dysphagia.  Pt does have a FH of thyroid ds. In cousin  No FH of thyroid cancer. No h/o radiation tx to head or neck.  No seaweed or kelp. No recent contrast studies.  At last visit she was on fenugreek, now off.  + Biotin use (not in last 1 week). No recent steroids use.   Pt. also has a history of myomectomy.  She gave birth to a healthy baby in 12/2017.  ROS: Constitutional: no weight gain/+ weight loss, no fatigue, + subjective hyperthermia, no subjective hypothermia Eyes: no blurry vision, no xerophthalmia ENT: no sore throat, + see HPI Cardiovascular: no CP/no SOB/+ occasional palpitations/no leg swelling Respiratory: no cough/no SOB/no wheezing Gastrointestinal: no N/no V/no D/no  C/no acid reflux Musculoskeletal: no muscle aches/no joint aches Skin: no rashes, no hair loss Neurological: no tremors/no numbness/no tingling/no dizziness  I reviewed pt's medications, allergies, PMH, social hx, family hx, and changes were documented in the history of present illness. Otherwise, unchanged from my initial visit note.  Past Medical History:  Diagnosis Date   DUB (dysfunctional uterine bleeding)    Graves disease 06/2016   History of abnormal cervical Pap smear    2011   History of HPV infection    Uterine fibroid    Wears glasses    Past Surgical  History:  Procedure Laterality Date   CESAREAN SECTION N/A 01/06/2018   Procedure: CESAREAN SECTION;  Surgeon: Caren Macadam, MD;  Location: [place];  Service: Obstetrics;  Laterality: N/A;   EXCISION OF SKIN TAG  11/22/2014   Procedure: EXCISION OF SKIN TAG VULVA;  Surgeon: Governor Specking, MD;  Location: [place];  Service: Gynecology;;   LAPAROSCOPIC GELPORT ASSISTED MYOMECTOMY N/A 11/22/2014   Procedure: LAPAROSCOPIC GELPORT ASSISTED MYOMECTOMY WITH CHROMOTUBATION;  Surgeon: Governor Specking, MD;  Location: [place];  Service: Gynecology;  Laterality: N/A;   LAPAROSCOPY N/A 11/22/2014   Procedure: LAPAROSCOPY DIAGNOSTIC;  Surgeon: Governor Specking, MD;  Location: [place];  Service: Gynecology;  Laterality: N/A;   LEEP  2011   TONSILLECTOMY AND ADENOIDECTOMY  age 61   WISDOM TOOTH EXTRACTION  2006   Social History   Social History   Marital status: Single    Spouse name: N/A   Number of children: 0   Occupational History   Lab tech   Social History Main Topics   Smoking status: Current Some Day Smoker    Packs/day: 0.25    Years: 10.00    Types: Cigarettes   Smokeless tobacco: Never Used   Alcohol use 0.0 oz/week     Comment: SOCIAL   Drug use: No   Sexual activity: Yes    Birth control/ protection: None   Current Outpatient Medications on File Prior to Visit  Medication Sig Dispense Refill   acetaminophen (TYLENOL) 500 MG tablet Take 500 mg by mouth every 6 (six) hours as needed for moderate pain. (Patient not taking: Reported on 08/02/2020)     cetirizine (ZYRTEC) 10 MG tablet Take 1 tablet (10 mg total) by mouth daily. 30 tablet 11   cholecalciferol (VITAMIN D) 1000 units tablet Take 1,000 Units by mouth daily.     clindamycin-benzoyl peroxide (BENZACLIN) gel Apply 1 application topically every morning.     cyanocobalamin 1000 MCG tablet Take 1,000 mcg by mouth daily.     hydroquinone 4 % cream  Apply to face nightly to every other night with tretinoin     ibuprofen (ADVIL) 800 MG tablet TAKE 1 TABLET BY MOUTH EVERY 8 HOURS AS NEEDED 30 tablet 5   neomycin-polymyxin-hydrocortisone (CORTISPORIN) OTIC solution Apply 1-2 drops to toenail after soaking and then cover with bandaid 10 mL 0   NORTREL 1/35, 28, tablet Take 1 tablet by mouth once daily 28 tablet 0   Omega-3 Fatty Acids (FISH OIL) 1000 MG CAPS Take 1,000 mg by mouth daily.      PARoxetine (PAXIL) 20 MG tablet Take 1 tablet (20 mg total) by mouth daily. 30 tablet 11   SPIRONOLACTONE PO Take by mouth daily.     tacrolimus (PROTOPIC) 0.1 % ointment Apply to neck daily to twice daily as needed.     traMADol (ULTRAM) 50 MG tablet TAKE  1 TABLET BY MOUTH EVERY 8 HOURS AS NEEDED FOR  UP  TO  5  DAYS (Patient not taking: Reported on 08/02/2020) 15 tablet 0   tretinoin (RETIN-A) 0.025 % cream Apply to face nightly to tolerance     triamcinolone cream (KENALOG) 0.5 % Apply topically. (Patient not taking: Reported on 08/02/2020)     No current facility-administered medications on file prior to visit.   Allergies  Allergen Reactions   Minocycline Rash   Family History  Problem Relation Age of Onset   Fibroids Mother    Thyroid disease Neg Hx    PE: BP 128/82 (BP Location: Right Arm, Patient Position: Sitting, Cuff Size: Normal)   Pulse 67   Ht 5\' 3"  (1.6 m)   Wt 216 lb 12.8 oz (98.3 kg)   LMP 07/26/2020   SpO2 98%   BMI 38.40 kg/m  Wt Readings from Last 3 Encounters:  08/16/20 216 lb 12.8 oz (98.3 kg)  08/02/20 222 lb (100.7 kg)  12/27/19 224 lb (101.6 kg)   Constitutional: overweight, in NAD Eyes: PERRLA, EOMI, no exophthalmos ENT: moist mucous membranes, + mild symmetric thyromegaly, no cervical lymphadenopathy Cardiovascular: RRR, No MRG Respiratory: CTA B Gastrointestinal: abdomen soft, NT, ND, BS+ Musculoskeletal: no deformities, strength intact in all 4 Skin: moist, warm, no rashes Neurological: no tremor with  outstretched hands, DTR normal in all 4  ASSESSMENT: 1. Graves ds  PLAN:  1. Patient with history of Graves' disease, diagnosed based on the course of the disease and elevated TSI antibodies.  Therefore, we skipped the thyroid uptake and scan for diagnosis. -We initially had her on methimazole and changed to PTU before her latest pregnancy. She did not require medication during pregnancy.  She was then lost for follow-up between 2019 and 2020.  She had repeat labs in 02/2019 and I sent her a message through MyChart to restart the low-dose methimazole, 2.5 mg daily, as the TSH was suppressed.  She did not see the message.  She returned to the clinic in 08/2019 and at that time she was not on methimazole. TFTs checked at last visit were normal, so we did not start the thionamide.  -At this visit, she denies hypothyroid symptoms except occasional palpitations, which are chronic for her.  At last visit, she gained approximately since the previous visit.  She lost approximately 5 pounds since then. She is trying to do keto diet. -At today's visit we will recheck her TFTs and see if we need to restart methimazole -No plans for another pregnancy for now -Latest TSI's were reviewed and they were normal.  We will not repeat them today. -No evidence of Graves' ophthalmopathy: No blurry vision, double vision, eye pain, chemosis -I we will see her back in 1 year but possibly sooner for labs  Component     Latest Ref Rng & Units 08/16/2020  TSH     0.35 - 4.50 uIU/mL 0.66  T4,Free(Direct)     0.60 - 1.60 ng/dL 0.87  Triiodothyronine,Free,Serum     2.3 - 4.2 pg/mL 3.7  Normal TFTs.  No need to restart thionamides.  Philemon Kingdom, MD PhD [facility]

## 2020-08-21 ENCOUNTER — Other Ambulatory Visit: Payer: Self-pay | Admitting: Obstetrics

## 2020-08-21 DIAGNOSIS — Z30011 Encounter for initial prescription of contraceptive pills: Secondary | ICD-10-CM

## 2020-08-23 ENCOUNTER — Telehealth: Payer: Self-pay

## 2020-08-23 ENCOUNTER — Ambulatory Visit: Payer: Managed Care, Other (non HMO) | Admitting: Internal Medicine

## 2020-08-23 NOTE — Telephone Encounter (Signed)
Patient is requesting a stronger medicine for cramps. Motrin or Tyenlol is not working.

## 2020-09-21 ENCOUNTER — Other Ambulatory Visit: Payer: Self-pay

## 2020-09-21 DIAGNOSIS — Z30011 Encounter for initial prescription of contraceptive pills: Secondary | ICD-10-CM

## 2020-09-21 MED ORDER — NORTREL 1/35 (28) 1-35 MG-MCG PO TABS: 1.0000 | ORAL_TABLET | Freq: Every day | ORAL | 11 refills | Status: DC

## 2020-09-21 NOTE — Telephone Encounter (Signed)
Pt needs BCP refills.  Last annual 08/02/2020

## 2020-10-22 ENCOUNTER — Other Ambulatory Visit: Payer: Self-pay | Admitting: Obstetrics

## 2020-12-06 ENCOUNTER — Ambulatory Visit (INDEPENDENT_AMBULATORY_CARE_PROVIDER_SITE_OTHER): Payer: Managed Care, Other (non HMO)

## 2020-12-06 ENCOUNTER — Other Ambulatory Visit: Payer: Self-pay

## 2020-12-06 ENCOUNTER — Ambulatory Visit: Payer: Managed Care, Other (non HMO) | Admitting: Podiatry

## 2020-12-06 ENCOUNTER — Encounter: Payer: Self-pay | Admitting: Podiatry

## 2020-12-06 DIAGNOSIS — M778 Other enthesopathies, not elsewhere classified: Secondary | ICD-10-CM

## 2020-12-06 DIAGNOSIS — M79671 Pain in right foot: Secondary | ICD-10-CM

## 2020-12-06 MED ORDER — MELOXICAM 15 MG PO TABS: 15.0000 mg | ORAL_TABLET | Freq: Every day | ORAL | 0 refills | Status: DC

## 2020-12-06 NOTE — Progress Notes (Signed)
X-rays reviewed. No acute fractures or dislocations noted.

## 2020-12-06 NOTE — Progress Notes (Signed)
  Subjective:  Patient ID: Krista Hogan, female    DOB: Jul 03, 1980,   MRN: [id number]  Chief Complaint  Patient presents with   Foot Pain    Hx of PF. Patient states she went on vacation and did a lot of walking in august. Patient right foot lateral aspect is hurting now.     40 y.o. female presents for right foot pain that has been present for a couple months. States she was [place] [employer] and the pain started happening after that when she walks. Denies any treatments. Has a history of plantar fasciitis  . Denies any other pedal complaints. Denies n/v/f/c.   Past Medical History:  Diagnosis Date   DUB (dysfunctional uterine bleeding)    Graves disease 06/2016   History of abnormal cervical Pap smear    2011   History of HPV infection    Uterine fibroid    Wears glasses     Objective:  Physical Exam: Vascular: DP/PT pulses 2/4 bilateral. CFT <3 seconds. Normal hair growth on digits. No edema.  Skin. No lacerations or abrasions bilateral feet.  Musculoskeletal: MMT 5/5 bilateral lower extremities in DF, PF, Inversion and Eversion. Deceased ROM in DF of ankle joint. Tender over fourth and fifth metatrsal shafts along extensor tendons. Tendon with extension and plantarflexion of toes.  Neurological: Sensation intact to light touch.   Assessment:   1. Extensor tendinitis of foot      Plan:  Patient was evaluated and treated and all questions answered. X-rays reviewed and discussed with patient. Discussed extensor tendinitis and treatment options at length with patient Discussed stretching exercises and provided handout. Prescription for meloxicam provided Dispensed Cam boot  Discussed that if the symptoms do not improve can consider PT/MRI. Patient to return in 4-6 weeks or sooner if symptoms fail to improve or worsen.   Lorenda Peck, DPM

## 2020-12-06 NOTE — Patient Instructions (Signed)
Extensor Tendonitis  Ask your health care provider which exercises are safe for you. Do exercises exactly as told by your health care provider and adjust them as directed. It is normal to feel mild stretching, pulling, tightness, or discomfort as you do these exercises. Stop right away if you feel sudden pain or your pain gets worse. Do not begin these exercises until told by your health care provider. Stretching and range-of-motion exercises These exercises warm up your muscles and joints and improve the movement and flexibility in your ankle and foot. These exercises may also help to relieve pain. Standing wall calf stretch, knee straight  Stand with your hands against a wall. Extend your left / right leg behind you, and bend your front knee slightly. If directed, place a folded washcloth under the arch of your foot for support. Point the toes of your back foot slightly inward. Keeping your heels on the floor and your back knee straight, shift your weight toward the wall. Do not allow your back to arch. You should feel a gentle stretch in your upper left / right calf. Hold this position for __________ seconds. Repeat __________ times. Complete this exercise __________ times a day. Standing wall calf stretch, knee bent Stand with your hands against a wall. Extend your left / right leg behind you, and bend your front knee slightly. If directed, place a folded washcloth under the arch of your foot for support. Point the toes of your back foot slightly inward. Unlock your back knee so it is bent. Keep your heels on the floor. You should feel a gentle stretch deep in your lower left / right calf. Hold this position for __________ seconds. Repeat __________ times. Complete this exercise __________ times a day. Strengthening exercises These exercises build strength and endurance in your ankle and foot. Endurance is the ability to use your muscles for a long time, even after they get tired. Ankle  inversion with band Secure one end of a rubber exercise band or tubing to a fixed object, such as a table leg or a pole, that will stay still when the band is pulled. Loop the other end of the band around the middle of your left / right foot. Sit on the floor facing the object with your left / right leg extended. The band or tube should be slightly tense when your foot is relaxed. Leading with your big toe, slowly bring your left / right foot and ankle inward, toward your other foot (inversion). Hold this position for __________ seconds. Slowly return your foot to the starting position. Repeat __________ times. Complete this exercise __________ times a day. Towel curls  Sit in a chair on a non-carpeted surface, and put your feet on the floor. Place a towel in front of your feet. If told by your health care provider, add a __________ weight to the end of the towel. Keeping your heel on the floor, put your left / right foot on the towel. Pull the towel toward you by grabbing the towel with your toes and curling them under. Keep your heel on the floor while you do this. Let your toes relax. Grab the towel with your toes again. Keep going until the towel is completely underneath your foot. Repeat __________ times. Complete this exercise __________ times a day. Balance exercise This exercise improves or maintains your balance. Balance is important in preventing falls. Single leg stand Without wearing shoes, stand near a railing or in a doorway. You may hold on  to the railing or door frame as needed for balance. Stand on your left / right foot. Keep your big toe down on the floor and try to keep your arch lifted. If balancing in this position is too easy, try the exercise with your eyes closed or while standing on a pillow. Hold this position for __________ seconds. Repeat __________ times. Complete this exercise __________ times a day. This information is not intended to replace advice given to  you by your health care provider. Make sure you discuss any questions you have with your health care provider. Document Revised: 06/22/2018 Document Reviewed: 04/28/2018 Elsevier Patient Education  [place].

## 2020-12-07 ENCOUNTER — Other Ambulatory Visit: Payer: Self-pay | Admitting: Podiatry

## 2020-12-07 DIAGNOSIS — M778 Other enthesopathies, not elsewhere classified: Secondary | ICD-10-CM

## 2020-12-11 ENCOUNTER — Other Ambulatory Visit: Payer: Self-pay | Admitting: Podiatry

## 2020-12-31 ENCOUNTER — Telehealth: Payer: Self-pay | Admitting: *Deleted

## 2020-12-31 NOTE — Telephone Encounter (Signed)
Please schedule for sooner appointment,

## 2020-12-31 NOTE — Telephone Encounter (Signed)
Patient is having throbbing pain in toes and foot, shooting up into her legs, taking ibuprofen,soaking  not helping. She has an upcoming appointment in Nov, can she be scheduled for sooner appointment?

## 2021-01-02 ENCOUNTER — Ambulatory Visit: Payer: Managed Care, Other (non HMO) | Admitting: Podiatry

## 2021-01-02 ENCOUNTER — Ambulatory Visit: Payer: Managed Care, Other (non HMO) | Admitting: Obstetrics

## 2021-01-17 ENCOUNTER — Other Ambulatory Visit: Payer: Self-pay

## 2021-01-17 ENCOUNTER — Ambulatory Visit: Payer: Managed Care, Other (non HMO) | Admitting: Sports Medicine

## 2021-01-17 ENCOUNTER — Encounter: Payer: Self-pay | Admitting: Sports Medicine

## 2021-01-17 DIAGNOSIS — M778 Other enthesopathies, not elsewhere classified: Secondary | ICD-10-CM

## 2021-01-17 DIAGNOSIS — M79671 Pain in right foot: Secondary | ICD-10-CM

## 2021-01-17 DIAGNOSIS — M25571 Pain in right ankle and joints of right foot: Secondary | ICD-10-CM

## 2021-01-17 DIAGNOSIS — M779 Enthesopathy, unspecified: Secondary | ICD-10-CM

## 2021-01-17 MED ORDER — PREDNISONE 10 MG (21) PO TBPK: ORAL_TABLET | ORAL | 0 refills | Status: DC

## 2021-01-17 MED ORDER — TRIAMCINOLONE ACETONIDE 10 MG/ML IJ SUSP: 10.0000 mg | Freq: Once | INTRAMUSCULAR | Status: AC

## 2021-01-17 NOTE — Progress Notes (Signed)
Subjective: Krista Hogan is a 40 y.o. female patient who presents to office for evaluation of right foot pain.  Patient reports that pain has been present since August initially was not using cam boot but then started because the pain continued to worsen states that she went to a trip in [place] to [employer] and did a lot of walking and standing and afterwards had debilitating pain on the right foot does not recall any specific trauma or injury but does remember tightening up her shoes and having an issue with her shoe fitting properly that could have added to her pain and symptoms.  Patient reports the anti-inflammatory medicine she has completed but is still has not helped.  Admits that pain and swelling is worsening.  Patient Active Problem List   Diagnosis Date Noted   Right lower quadrant pain 03/29/2020   Chronic idiopathic constipation 03/29/2020   Acute anal fissure 03/29/2020   Irritant dermatitis 03/21/2020   Itching of both hands 12/27/2019   Acne scarring 11/02/2019   Acne vulgaris 11/02/2019   Hyperpigmentation 11/02/2019   Gastroesophageal reflux disease without esophagitis 08/29/2019   Globus pharyngeus 08/29/2019   Nasal turbinate hypertrophy 08/29/2019   Rhinitis, chronic 08/29/2019   Throat irritation 08/29/2019   Palpitations 02/25/2019   Dysesthesia 02/01/2019   Groin pain 02/01/2019   Obesity 02/01/2019   Postpartum hypertension 01/10/2018   S/P repeat low transverse C-section 01/06/2018   Supervision of other normal pregnancy, antepartum 07/08/2017   Graves disease 10/01/2016   Genital herpes simplex 07/29/2016   Cyst of ovary 07/28/2016   Genital warts 07/28/2016   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 (Patient not taking: Reported on 08/02/2020)     cetirizine (ZYRTEC) 10 MG tablet Take 1 tablet (10 mg total) by mouth daily. 30 tablet 11   cholecalciferol (VITAMIN D) 1000 units tablet Take 1,000 Units by mouth daily.     clindamycin-benzoyl peroxide (BENZACLIN) gel Apply 1 application topically every morning.     cyanocobalamin 1000 MCG tablet Take 1,000 mcg by mouth daily.     hydroquinone 4 % cream Apply to face nightly to every other night with tretinoin     ibuprofen (ADVIL) 800 MG tablet TAKE 1 TABLET BY MOUTH EVERY 8 HOURS AS NEEDED 30 tablet 5   meloxicam (MOBIC) 15 MG tablet Take 1 tablet (15 mg total) by mouth daily. 30 tablet 0   neomycin-polymyxin-hydrocortisone (CORTISPORIN) OTIC solution Apply 1-2 drops to toenail after soaking and then cover with bandaid 10 mL 0   norethindrone-ethinyl estradiol 1/35 (NORTREL 1/35, 28,) tablet Take 1 tablet by mouth daily. 28 tablet 11   Omega-3 Fatty Acids (FISH OIL) 1000 MG CAPS Take 1,000 mg by mouth daily.      PARoxetine (PAXIL) 20 MG tablet Take 1 tablet (20 mg total) by mouth daily. 30 tablet 11   SPIRONOLACTONE PO Take by mouth daily.     tacrolimus (PROTOPIC) 0.1 % ointment Apply to neck daily to twice daily as needed.     traMADol (ULTRAM) 50 MG tablet TAKE 1 TABLET BY MOUTH  EVERY 8 HOURS AS NEEDED FOR  UP  TO  5  DAYS (Patient not taking: Reported on 08/02/2020) 15 tablet 0   tretinoin (RETIN-A) 0.025 % cream Apply to face nightly to tolerance     triamcinolone cream (KENALOG) 0.5 % Apply topically. (Patient not taking: Reported on 08/02/2020)     No current facility-administered medications on file prior to visit.    Allergies  Allergen Reactions   Minocycline Rash    Objective:  General: Alert and oriented x3 in no acute distress  Dermatology: No open lesions  bilateral lower extremities, no webspace macerations, no ecchymosis bilateral, all nails x 10 are well manicured.  Vascular: Dorsalis Pedis and Posterior Tibial pedal pulses palpable, Capillary Fill Time 3 seconds,(+) pedal hair growth bilateral, no edema bilateral lower extremities, Temperature gradient within normal limits.  Neurology: Johney Maine sensation intact via light touch bilateral  Musculoskeletal: Mild to moderate tenderness over the dorsal lateral fourth and fifth metatarsal bases extending to the lateral ankle and to the sinus tarsi area of the right foot.  There is localized swelling without warmth to this area mild guarding due to pain.   Assessment and Plan: Problem List Items Addressed This Visit   None Visit Diagnoses     Capsulitis    -  Primary   Relevant Orders   MR FOOT RIGHT WO CONTRAST   Extensor tendinitis of foot       Relevant Orders   MR FOOT RIGHT WO CONTRAST   Right foot pain       Relevant Orders   MR FOOT RIGHT WO CONTRAST   Sinus tarsi syndrome of right foot       Relevant Orders   MR FOOT RIGHT WO CONTRAST        -Complete examination performed -Previous x-rays are negative -Discussed treatment options for tendinitis versus capsulitis versus possible ligament or tendon tear since pain is worsening -Patient is requesting an injection or something to give her immediate relief After oral consent and aseptic prep, injected a mixture containing 1 ml of 2%  plain lidocaine, 1 ml 0.5% plain marcaine, 0.5 ml of kenalog 10 and 0.5 ml of dexamethasone phosphate into dorsal lateral foot at area of maximal pain on the right without complication. Post-injection care discussed with patient.  -Rx prednisone dose pak -Advised patient after she finishes the steroid Dosepak if she still has pain may call office for me to also prescribe her very low dose of pain medicine -Dispensed Surgigrip compression sleeve for patient to use as directed -Advised patient to  continue with wrist and elevation and to use cam boot until we can get her MRI as I have ordered to evaluate for any other possible tear .-Return after MRI  Landis Martins, DPM

## 2021-01-21 ENCOUNTER — Encounter: Payer: Self-pay | Admitting: Sports Medicine

## 2021-01-21 ENCOUNTER — Other Ambulatory Visit: Payer: Self-pay | Admitting: Sports Medicine

## 2021-01-21 MED ORDER — HYDROCODONE-ACETAMINOPHEN 10-325 MG PO TABS: 1.0000 | ORAL_TABLET | Freq: Three times a day (TID) | ORAL | 0 refills | Status: AC | PRN

## 2021-01-21 NOTE — Progress Notes (Signed)
Sent Norco for pain

## 2021-02-04 ENCOUNTER — Other Ambulatory Visit: Payer: Self-pay | Admitting: Sports Medicine

## 2021-02-04 MED ORDER — HYDROCODONE-ACETAMINOPHEN 10-325 MG PO TABS: 1.0000 | ORAL_TABLET | Freq: Three times a day (TID) | ORAL | 0 refills | Status: AC | PRN

## 2021-02-04 NOTE — Progress Notes (Signed)
Pain med refilled

## 2021-02-13 ENCOUNTER — Ambulatory Visit
Admission: RE | Admit: 2021-02-13 | Discharge: 2021-02-13 | Disposition: A | Discharge status: Home / Self Care | Payer: Managed Care, Other (non HMO) | Source: Ambulatory Visit | Attending: Sports Medicine | Admitting: Sports Medicine

## 2021-02-13 ENCOUNTER — Other Ambulatory Visit: Payer: Self-pay

## 2021-02-13 DIAGNOSIS — M25571 Pain in right ankle and joints of right foot: Secondary | ICD-10-CM

## 2021-02-13 DIAGNOSIS — M778 Other enthesopathies, not elsewhere classified: Secondary | ICD-10-CM

## 2021-02-13 DIAGNOSIS — M779 Enthesopathy, unspecified: Secondary | ICD-10-CM

## 2021-02-13 DIAGNOSIS — M79671 Pain in right foot: Secondary | ICD-10-CM

## 2021-02-18 ENCOUNTER — Other Ambulatory Visit: Payer: Self-pay | Admitting: Sports Medicine

## 2021-02-18 ENCOUNTER — Telehealth: Payer: Self-pay | Admitting: *Deleted

## 2021-02-18 MED ORDER — HYDROCODONE-ACETAMINOPHEN 10-325 MG PO TABS: 1.0000 | ORAL_TABLET | Freq: Three times a day (TID) | ORAL | 0 refills | Status: AC | PRN

## 2021-02-18 NOTE — Telephone Encounter (Signed)
Called and spoke with patient giving MRI results /recommendations per Dr Cannon Kettle, she verbalized understanding,will proceed with the physical therapy,requesting a refill of her pain medicine as well.Please advise.

## 2021-02-18 NOTE — Telephone Encounter (Signed)
-----   Message from Landis Martins, [place] sent at 02/14/2021  5:45 PM EST ----- Regarding: MRI results Please let patient know that MRI is negative.  There is no tendon tear or ligament tear.  If she is still having pain I recommend at this time for [place] to start physical therapy.  If patient is agreeable I can send her to physical therapy at [facility] on [address] Chauncey Cruel

## 2021-02-18 NOTE — Progress Notes (Signed)
Pain meds refilled 

## 2021-02-18 NOTE — Telephone Encounter (Signed)
Please advise 

## 2021-02-19 ENCOUNTER — Other Ambulatory Visit: Payer: Self-pay | Admitting: Sports Medicine

## 2021-02-19 ENCOUNTER — Other Ambulatory Visit: Payer: Self-pay | Admitting: Obstetrics

## 2021-02-19 DIAGNOSIS — M25571 Pain in right ankle and joints of right foot: Secondary | ICD-10-CM

## 2021-02-19 DIAGNOSIS — M778 Other enthesopathies, not elsewhere classified: Secondary | ICD-10-CM

## 2021-02-19 DIAGNOSIS — N944 Primary dysmenorrhea: Secondary | ICD-10-CM

## 2021-02-19 DIAGNOSIS — M779 Enthesopathy, unspecified: Secondary | ICD-10-CM

## 2021-02-19 DIAGNOSIS — M79671 Pain in right foot: Secondary | ICD-10-CM

## 2021-02-19 NOTE — Telephone Encounter (Signed)
Please advise 

## 2021-02-19 NOTE — Progress Notes (Signed)
Physical therapy for right foot and ankle pain  Previous MRI negative

## 2021-02-20 NOTE — Telephone Encounter (Signed)
Patient has been notified

## 2021-03-13 NOTE — Therapy (Signed)
OUTPATIENT PHYSICAL THERAPY LOWER EXTREMITY EVALUATION   Patient Name: Krista Hogan MRN: [id number] DOB:[birth year], 41 y.o., female Today's Date: 03/14/2021   PT End of Session - 03/14/21 1536     Visit Number 1    Number of Visits 17    Date for PT Re-Evaluation 05/09/21    Authorization Type [employer] Managed    PT Start Time 1536    PT Stop Time 2536    PT Time Calculation (min) 38 min    Activity Tolerance Patient limited by pain             Past Medical History:  Diagnosis Date   DUB (dysfunctional uterine bleeding)    Graves disease 06/2016   History of abnormal cervical Pap smear    2011   History of HPV infection    Uterine fibroid    Wears glasses    Past Surgical History:  Procedure Laterality Date   CESAREAN SECTION N/A 01/06/2018   Procedure: CESAREAN SECTION;  Surgeon: Caren Macadam, MD;  Location: [place];  Service: Obstetrics;  Laterality: N/A;   EXCISION OF SKIN TAG  11/22/2014   Procedure: EXCISION OF SKIN TAG VULVA;  Surgeon: Governor Specking, MD;  Location: [place];  Service: Gynecology;;   LAPAROSCOPIC GELPORT ASSISTED MYOMECTOMY N/A 11/22/2014   Procedure: LAPAROSCOPIC GELPORT ASSISTED MYOMECTOMY WITH CHROMOTUBATION;  Surgeon: Governor Specking, MD;  Location: [place];  Service: Gynecology;  Laterality: N/A;   LAPAROSCOPY N/A 11/22/2014   Procedure: LAPAROSCOPY DIAGNOSTIC;  Surgeon: Governor Specking, MD;  Location: [place];  Service: Gynecology;  Laterality: N/A;   LEEP  2011   TONSILLECTOMY AND ADENOIDECTOMY  age 74   WISDOM TOOTH EXTRACTION  2006   Patient Active Problem List   Diagnosis Date Noted   Right lower quadrant pain 03/29/2020   Chronic idiopathic constipation 03/29/2020   Acute anal fissure 03/29/2020   Irritant dermatitis 03/21/2020   Itching of both hands 12/27/2019   Acne scarring 11/02/2019   Acne vulgaris 11/02/2019   Hyperpigmentation 11/02/2019    Gastroesophageal reflux disease without esophagitis 08/29/2019   Globus pharyngeus 08/29/2019   Nasal turbinate hypertrophy 08/29/2019   Rhinitis, chronic 08/29/2019   Throat irritation 08/29/2019   Palpitations 02/25/2019   Dysesthesia 02/01/2019   Groin pain 02/01/2019   Obesity 02/01/2019   Postpartum hypertension 01/10/2018   S/P repeat low transverse C-section 01/06/2018   Supervision of other normal pregnancy, antepartum 07/08/2017   Graves disease 10/01/2016   Genital herpes simplex 07/29/2016   Cyst of ovary 07/28/2016   Genital warts 07/28/2016   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   Migraine 07/28/2016   Hypothyroidism 12/17/2015   Mucosal irritation of oral cavity 12/17/2015   Otalgia, right 12/17/2015   Chronic pelvic pain in female 11/15/2015   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    PCP: Minette Brine, FNP  REFERRING PROVIDER: Landis Martins, DPM  REFERRING DIAG: M77.9 (ICD-10-CM) - Capsulitis M77.8 (ICD-10-CM) - Extensor tendinitis of foot M79.671 (ICD-10-CM) - Right foot pain M25.571 (ICD-10-CM) - Sinus tarsi syndrome of right foot  THERAPY DIAG:  Pain in right ankle and joints of right foot  Muscle weakness (generalized)  Other abnormalities of gait and mobility  Unsteadiness on feet  ONSET DATE: August 2022  SUBJECTIVE:   SUBJECTIVE STATEMENT: Pt presents to PT with reports of R foot/ankle pain since August 2022 after walking a lot at [employer]. She has been walking in fx boot and notes pain mainly  on top and lateral aspect of R foot. She denies trauma to R LE, gradual onset of increasing pain. Has had previous hx of plantar fascitis in bilateral feet, but otherwise no past hx of pain. Also denies N/T or other paresthesias in R foot.   PERTINENT HISTORY: Hx of lateral R foot/ankle pain since August 2022  PAIN:  Are you having pain? Yes Numeric Pain Scale: 7/10 (10/10 at worst) Pain location: R foot, on tap and lateral  PAIN TYPE:  aching Pain description: intermittent  Aggravating factors: walking, driving, standing  Relieving factors: rest  PRECAUTIONS: None  WEIGHT BEARING RESTRICTIONS No  FALLS:  Has patient fallen in last 6 months? 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 No injury.   EXAM: MRI OF THE RIGHT ANKLE WITHOUT CONTRAST   TECHNIQUE: Multiplanar, multisequence MR imaging of the right ankle was performed. No intravenous contrast was administered.   COMPARISON:  Right foot x-rays dated December 07, 2020.   FINDINGS: TENDONS   Peroneal: Peroneal longus tendon intact. Peroneal brevis intact.   Posteromedial: Posterior tibial tendon intact. Flexor digitorum longus tendon intact. Flexor hallucis longus tendon intact.   Anterior: Tibialis anterior tendon intact. Extensor hallucis longus tendon intact Extensor digitorum longus tendon intact.   Achilles:  Intact.   Plantar Fascia: Intact.   LIGAMENTS   Lateral: Anterior talofibular ligament intact. Calcaneofibular ligament intact. Posterior talofibular ligament intact. Anterior and posterior tibiofibular ligaments intact.   Medial: Deltoid ligament intact. Spring ligament intact.   CARTILAGE   Ankle Joint: No joint effusion. Normal ankle mortise. No chondral defect.   Subtalar Joints/Sinus Tarsi: Normal subtalar joints. No subtalar joint effusion. Normal sinus tarsi.   Bones: No marrow signal abnormality. No fracture or dislocation. Type 2 accessory navicular with  minimal degenerative changes of the synchondrosis.   Soft Tissue: No soft tissue mass or fluid collection.   IMPRESSION: 1. No acute abnormality. No stress injury or tendinopathy. Normal sinus tarsi. 2. Type 2 accessory navicular with minimal degenerative changes.    PATIENT SURVEYS:  FOTO 34% function; 62% predicted  COGNITION:  Overall cognitive status: Within functional limits for tasks assessed     SENSATION:  Light touch: Appears intact  Stereognosis: Appears intact  Hot/Cold: Appears intact  Proprioception: Appears intact  POSTURE:  Larger body habitus; pes planus bilaterally  PALPATION: Sharp TTP to R lateral foot, R extensor bundle  LE AROM/PROM:  A/PROM Right 03/14/2021 Left 03/14/2021  Hip flexion    Hip extension    Hip abduction    Hip adduction    Hip internal rotation    Hip external rotation    Knee flexion    Knee extension    Ankle dorsiflexion 6   Ankle plantarflexion WFL   Ankle inversion    Ankle eversion     (Blank rows = not tested)  LE MMT:  MMT Right 03/14/2021 Left 03/14/2021  Hip flexion    Hip extension    Hip abduction    Hip adduction    Hip internal rotation  Hip external rotation    Knee flexion    Knee extension    Ankle dorsiflexion 3/5   Ankle plantarflexion 3+/5   Ankle inversion 3+/5   Ankle eversion 2+/5    (Blank rows = not tested)  LOWER EXTREMITY SPECIAL TESTS:  Ankle special tests: Provocative tinel's test: positive   FUNCTIONAL TESTS:  30 Sec STS:  7 reps in CAM boot SLS: DNT  GAIT: Distance walked: amb 21ft with CAM boot donned on R LE Assistive device utilized: None Level of assistance: Modified independence Comments: antalgic gait on R   TODAY'S TREATMENT: Therapeutic Exercise: R ankle PF x 10 YTB R ankle inv/ev x 10 YTB Seated heel raise x 10  R calf stretch x 10"   PATIENT EDUCATION:  Education details: eval findings, FOTO, HEP, POC Person educated: Patient Education method:  Explanation, Demonstration, and Handouts Education comprehension: verbalized understanding and returned demonstration   HOME EXERCISE PROGRAM: Access Code: [email or web address]  ASSESSMENT:  CLINICAL IMPRESSION: Pt presents to PT with reports of chronic R foot/ankle pain since August 2022. Physical findings are consistent with referring provider impression, as pt demonstrates decreased R ankle DF, decreased 30"STS reps, and significant TTP to R lateral foot along with fear avoidance behaviors during testing. Unable to bear weight out of boot d/t pain, also shows significant R ankle weakness, indicating decreased functional ability below PLOF. Pt would benefit from skilled PT services working on improving R foot/ankle strength and stability. Will trial initial plan of alternating gym and aquatic sessions for improving strength in reduced joint load environment.   REHAB POTENTIAL: Good  CLINICAL DECISION MAKING: Stable/uncomplicated  EVALUATION COMPLEXITY: Low   GOALS: Goals reviewed with patient? No  SHORT TERM GOALS:  STG Name Target Date Goal status  1 Pt will be compliant and knowledgeable with initial HEP for improved comfort and carryover Baseline: initial HEP given 04/04/2021 INITIAL  2 Pt will self report R foot/ankle pain no greater than 6/10 for improved comfort and functional ability Baseline: 10/10 R foot/ankle pain at worst 04/04/2021 INITIAL   LONG TERM GOALS:   LTG Name Target Date Goal status  1 Pt will improve FOTO function score to no less than 62% as proxy for functional improvement  Baseline: 34% function 05/09/2021 INITIAL  2 Pt will self report R foot/ankle pain no greater than 3/10 for improved comfort and functional ability  Baseline: 10/10 R foot/ankle pain at worst 05/09/2021 INITIAL  3 Pt will be able hold SLS on R LE no less than 15 seconds for improved balance and mobility Baseline: unable 05/09/2021 INITIAL  4 Pt will improve R ankle DF no less than 10 deg for improved  functional mobility Baseline: 6 deg 05/09/2021 INITIAL  5 Pt will improve R ankle MMT to no less than 4/5 for all tested motions in order to improve comfort and functional ability Baseline: 05/09/2021 INITIAL   PLAN: PT FREQUENCY: 2x/week  PT DURATION: 8 weeks  PLANNED INTERVENTIONS: Therapeutic exercises, Therapeutic activity, Neuro Muscular re-education, Balance training, Gait training, Patient/Family education, Joint mobilization, Aquatic Therapy, Dry Needling, Cryotherapy, Moist heat, Taping, Vasopneumatic device, and Manual therapy  PLAN FOR NEXT SESSION: assess response to HEP; trial tandem stance; progress gait and R ankle strengthening as able   Ward Chatters 03/14/2021, 4:52 PM

## 2021-03-14 ENCOUNTER — Other Ambulatory Visit: Payer: Self-pay

## 2021-03-14 ENCOUNTER — Ambulatory Visit: Payer: Managed Care, Other (non HMO) | Attending: Sports Medicine

## 2021-03-14 DIAGNOSIS — M25571 Pain in right ankle and joints of right foot: Secondary | ICD-10-CM | POA: Insufficient documentation

## 2021-03-14 DIAGNOSIS — M79671 Pain in right foot: Secondary | ICD-10-CM | POA: Insufficient documentation

## 2021-03-14 DIAGNOSIS — R2689 Other abnormalities of gait and mobility: Secondary | ICD-10-CM | POA: Insufficient documentation

## 2021-03-14 DIAGNOSIS — R2681 Unsteadiness on feet: Secondary | ICD-10-CM | POA: Diagnosis present

## 2021-03-14 DIAGNOSIS — M778 Other enthesopathies, not elsewhere classified: Secondary | ICD-10-CM | POA: Diagnosis not present

## 2021-03-14 DIAGNOSIS — M6281 Muscle weakness (generalized): Secondary | ICD-10-CM | POA: Insufficient documentation

## 2021-03-14 DIAGNOSIS — M779 Enthesopathy, unspecified: Secondary | ICD-10-CM | POA: Diagnosis not present

## 2021-03-14 MED ORDER — HYDROCODONE-ACETAMINOPHEN 10-325 MG PO TABS: 1.0000 | ORAL_TABLET | Freq: Three times a day (TID) | ORAL | 0 refills | Status: AC | PRN

## 2021-03-21 ENCOUNTER — Ambulatory Visit: Payer: Managed Care, Other (non HMO)

## 2021-03-26 ENCOUNTER — Ambulatory Visit: Payer: Managed Care, Other (non HMO)

## 2021-03-26 ENCOUNTER — Other Ambulatory Visit: Payer: Self-pay

## 2021-03-26 DIAGNOSIS — M25571 Pain in right ankle and joints of right foot: Secondary | ICD-10-CM | POA: Diagnosis not present

## 2021-03-26 DIAGNOSIS — M6281 Muscle weakness (generalized): Secondary | ICD-10-CM

## 2021-03-26 NOTE — Therapy (Signed)
OUTPATIENT PHYSICAL THERAPY TREATMENT NOTE   Patient Name: Krista Hogan MRN: [id number] DOB:[birth year], 41 y.o., female Today's Date: 03/26/2021  PCP: Minette Brine, FNP REFERRING PROVIDER: Landis Martins, DPM   PT End of Session - 03/26/21 1618     Visit Number 2    Number of Visits 17    Date for PT Re-Evaluation 05/09/21    Authorization Type [employer] Managed    PT Start Time 1615    PT Stop Time 1655    PT Time Calculation (min) 40 min    Activity Tolerance Patient limited by pain    Behavior During Therapy [facility] for tasks assessed/performed             Past Medical History:  Diagnosis Date   DUB (dysfunctional uterine bleeding)    Graves disease 06/2016   History of abnormal cervical Pap smear    2011   History of HPV infection    Uterine fibroid    Wears glasses    Past Surgical History:  Procedure Laterality Date   CESAREAN SECTION N/A 01/06/2018   Procedure: CESAREAN SECTION;  Surgeon: Caren Macadam, MD;  Location: [place];  Service: Obstetrics;  Laterality: N/A;   EXCISION OF SKIN TAG  11/22/2014   Procedure: EXCISION OF SKIN TAG VULVA;  Surgeon: Governor Specking, MD;  Location: [place];  Service: Gynecology;;   LAPAROSCOPIC GELPORT ASSISTED MYOMECTOMY N/A 11/22/2014   Procedure: LAPAROSCOPIC GELPORT ASSISTED MYOMECTOMY WITH CHROMOTUBATION;  Surgeon: Governor Specking, MD;  Location: [place];  Service: Gynecology;  Laterality: N/A;   LAPAROSCOPY N/A 11/22/2014   Procedure: LAPAROSCOPY DIAGNOSTIC;  Surgeon: Governor Specking, MD;  Location: [place];  Service: Gynecology;  Laterality: N/A;   LEEP  2011   TONSILLECTOMY AND ADENOIDECTOMY  age 41   WISDOM TOOTH EXTRACTION  2006   Patient Active Problem List   Diagnosis Date Noted   Right lower quadrant pain 03/29/2020   Chronic idiopathic constipation 03/29/2020   Acute anal fissure 03/29/2020   Irritant dermatitis 03/21/2020   Itching  of both hands 12/27/2019   Acne scarring 11/02/2019   Acne vulgaris 11/02/2019   Hyperpigmentation 11/02/2019   Gastroesophageal reflux disease without esophagitis 08/29/2019   Globus pharyngeus 08/29/2019   Nasal turbinate hypertrophy 08/29/2019   Rhinitis, chronic 08/29/2019   Throat irritation 08/29/2019   Palpitations 02/25/2019   Dysesthesia 02/01/2019   Groin pain 02/01/2019   Obesity 02/01/2019   Postpartum hypertension 01/10/2018   S/P repeat low transverse C-section 01/06/2018   Supervision of other normal pregnancy, antepartum 07/08/2017   Graves disease 10/01/2016   Genital herpes simplex 07/29/2016   Cyst of ovary 07/28/2016   Genital warts 07/28/2016   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   Migraine 07/28/2016   Hypothyroidism 12/17/2015   Mucosal irritation of oral cavity 12/17/2015   Otalgia, right 12/17/2015   Chronic pelvic pain in female 11/15/2015   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    REFERRING DIAG:  M77.9 (ICD-10-CM) - Capsulitis M77.8 (ICD-10-CM) - Extensor tendinitis of foot M79.671 (ICD-10-CM) - Right foot pain M25.571 (ICD-10-CM) - Sinus tarsi syndrome of right foot  THERAPY DIAG:  Pain in right ankle and joints of right foot  Muscle weakness (generalized)  PERTINENT HISTORY: Hx of lateral R foot/ankle pain since August 2022  PRECAUTIONS: None  SUBJECTIVE: Pt presents to PT with continued R foot pain. Has been compliant with her HEP per report, does increase foot pain. She is ready to begin  PT treatment at this time.   Pain: Are you having pain? Yes NPRS: 5/10 Pain Location: R anterior foot Pain Frequency/Description: intermittent  Aggravating Factors: walking, driving, standing  Relieving Factors: rest   OBJECTIVE:    Interventions: Therapeutic Exercise: NuStep lvl 5 LE only x 4 min while taking subjective Seated heel raise 2x20  R calf stretchw/ towel 2x30" R ankle 4-way 2x15 YTB Tandem stance R back 2x30" Slant  board stretch 2x30" Manual Therapy: STM to R foot extensor bundle Passive stretch to R foot extensors   Patient Education: Education details: elevation and massage for decreasing R ankle swelling Person educated: Patient Education method: Customer service manager Education comprehension: verbalized understanding and returned demonstration   Home Exercise Program: Access Code: [email or web address]   ASSESSMENT: Pt was able to complete all prescribed exercises with no adverse effect or increase in pain. She responded well to manual therapy interventions, noting decreased pain post session. She should continue to be seen in skilled PT, working on improving distal R LE strength and manual for decreasing pain. Will continue to progress exercises as tolerated per POC.    GOALS: Goals reviewed with patient? No   SHORT TERM GOALS:   STG Name Target Date Goal status  1 Pt will be compliant and knowledgeable with initial HEP for improved comfort and carryover Baseline: initial HEP given 04/04/2021 INITIAL  2 Pt will self report R foot/ankle pain no greater than 6/10 for improved comfort and functional ability Baseline: 10/10 R foot/ankle pain at worst 04/04/2021 INITIAL    LONG TERM GOALS:    LTG Name Target Date Goal status  1 Pt will improve FOTO function score to no less than 62% as proxy for functional improvement  Baseline: 34% function 05/09/2021 INITIAL  2 Pt will self report R foot/ankle pain no greater than 3/10 for improved comfort and functional ability  Baseline: 10/10 R foot/ankle pain at worst 05/09/2021 INITIAL  3 Pt will be able hold SLS on R LE no less than 15 seconds for improved balance and mobility Baseline: unable 05/09/2021 INITIAL  4 Pt will improve R ankle DF no less than 10 deg for improved functional mobility Baseline: 6 deg 05/09/2021 INITIAL  5 Pt will improve R ankle MMT to no less than 4/5 for all tested motions in order to improve comfort and functional  ability Baseline: MMT Right 03/14/2021 Left 03/14/2021  Ankle dorsiflexion 3/5    Ankle plantarflexion 3+/5    Ankle inversion 3+/5    Ankle eversion 2+/5     05/09/2021 INITIAL    PLAN: PT FREQUENCY: 2x/week   PT DURATION: 8 weeks   PLANNED INTERVENTIONS: Therapeutic exercises, Therapeutic activity, Neuro Muscular re-education, Balance training, Gait training, Patient/Family education, Joint mobilization, Aquatic Therapy, Dry Needling, Cryotherapy, Moist heat, Taping, Vasopneumatic device, and Manual therapy   PLAN FOR NEXT SESSION: assess response to HEP; trial tandem stance; progress gait and R ankle strengthening as able    Ward Chatters 03/26/2021, 5:18 PM

## 2021-03-28 ENCOUNTER — Encounter: Payer: Self-pay | Admitting: Sports Medicine

## 2021-03-28 ENCOUNTER — Other Ambulatory Visit: Payer: Self-pay

## 2021-03-28 ENCOUNTER — Ambulatory Visit: Payer: Managed Care, Other (non HMO) | Admitting: Sports Medicine

## 2021-03-28 DIAGNOSIS — M79671 Pain in right foot: Secondary | ICD-10-CM

## 2021-03-28 DIAGNOSIS — G8929 Other chronic pain: Secondary | ICD-10-CM | POA: Diagnosis not present

## 2021-03-28 DIAGNOSIS — M792 Neuralgia and neuritis, unspecified: Secondary | ICD-10-CM | POA: Diagnosis not present

## 2021-03-28 DIAGNOSIS — M779 Enthesopathy, unspecified: Secondary | ICD-10-CM

## 2021-03-28 MED ORDER — GABAPENTIN 100 MG PO CAPS: 100.0000 mg | ORAL_CAPSULE | Freq: Every day | ORAL | 3 refills | Status: DC

## 2021-03-28 MED ORDER — HYDROCODONE-ACETAMINOPHEN 10-325 MG PO TABS: 1.0000 | ORAL_TABLET | Freq: Four times a day (QID) | ORAL | 0 refills | Status: AC | PRN

## 2021-03-28 NOTE — Progress Notes (Signed)
Subjective: Krista Hogan is a 41 y.o. female patient who presents to office for evaluation of right foot pain.  Patient reports that pain is the same and has not improved.  Patient has went to about 2-3 sessions of physical therapy and states that therapy has not made much of a difference her therapist thinks that she might benefit from aquatics.  Patient has tried anti-inflammatory local injection compression sleeve physical therapy and boot and still complains of pain to the lateral foot and ankle on the right.  Patient's pain has been going on since early August after a trip to [place].  No other issues noted.  Patient Active Problem List   Diagnosis Date Noted   Right lower quadrant pain 03/29/2020   Chronic idiopathic constipation 03/29/2020   Acute anal fissure 03/29/2020   Irritant dermatitis 03/21/2020   Itching of both hands 12/27/2019   Acne scarring 11/02/2019   Acne vulgaris 11/02/2019   Hyperpigmentation 11/02/2019   Gastroesophageal reflux disease without esophagitis 08/29/2019   Globus pharyngeus 08/29/2019   Nasal turbinate hypertrophy 08/29/2019   Rhinitis, chronic 08/29/2019   Throat irritation 08/29/2019   Palpitations 02/25/2019   Dysesthesia 02/01/2019   Groin pain 02/01/2019   Obesity 02/01/2019   Postpartum hypertension 01/10/2018   S/P repeat low transverse C-section 01/06/2018   Supervision of other normal pregnancy, antepartum 07/08/2017   Graves disease 10/01/2016   Genital herpes simplex 07/29/2016   Cyst of ovary 07/28/2016   Genital warts 07/28/2016   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 (Patient not taking: Reported on 08/02/2020)     cetirizine (ZYRTEC) 10 MG tablet Take 1 tablet (10 mg total) by mouth daily. 30 tablet 11   cholecalciferol (VITAMIN D) 1000 units tablet Take 1,000 Units by mouth daily.     clindamycin-benzoyl peroxide (BENZACLIN) gel Apply 1 application topically every morning.     cyanocobalamin 1000 MCG tablet Take 1,000 mcg by mouth daily.     hydroquinone 4 % cream Apply to face nightly to every other night with tretinoin     ibuprofen (ADVIL) 800 MG tablet TAKE 1 TABLET BY MOUTH EVERY 8 HOURS AS NEEDED 30 tablet 5   neomycin-polymyxin-hydrocortisone (CORTISPORIN) OTIC solution Apply 1-2 drops to toenail after soaking and then cover with bandaid 10 mL 0   norethindrone-ethinyl estradiol 1/35 (NORTREL 1/35, 28,) tablet Take 1 tablet by mouth daily. 28 tablet 11   Omega-3 Fatty Acids (FISH OIL) 1000 MG CAPS Take 1,000 mg by mouth daily.      PARoxetine (PAXIL) 20 MG tablet Take 1 tablet (20 mg total) by mouth daily. 30 tablet 11   predniSONE (STERAPRED UNI-PAK 21 TAB) 10 MG (21) TBPK tablet Take as directed 21 tablet 0   SPIRONOLACTONE PO Take by mouth daily.     tacrolimus (PROTOPIC) 0.1 % ointment Apply to neck daily to twice daily as needed.     traMADol (ULTRAM) 50 MG tablet TAKE 1 TABLET BY MOUTH EVERY 8 HOURS AS NEEDED FOR  UP  TO  5  DAYS (Patient not taking: Reported on 08/02/2020)  15 tablet 0   tretinoin (RETIN-A) 0.025 % cream Apply to face nightly to tolerance     triamcinolone cream (KENALOG) 0.5 % Apply topically. (Patient not taking: Reported on 08/02/2020)     Current Facility-Administered Medications on File Prior to Visit  Medication Dose Route Frequency Provider Last Rate Last Admin   triamcinolone acetonide (KENALOG) 10 MG/ML injection 10 mg  10 mg Other Once Landis Martins, DPM        Allergies  Allergen Reactions   Minocycline Rash    Objective:  General: Alert and oriented x3  in no acute distress  Dermatology: No open lesions bilateral lower extremities, no webspace macerations, no ecchymosis bilateral, all nails x 10 are well manicured.  Vascular: Dorsalis Pedis and Posterior Tibial pedal pulses palpable, Capillary Fill Time 3 seconds,(+) pedal hair growth bilateral, no edema bilateral lower extremities, Temperature gradient within normal limits.  Neurology: Gross sensation intact via light touch bilateral.  Subjective shooting pain over the right lateral foot and ankle.  Musculoskeletal: Mild to moderate tenderness over the dorsal lateral fourth and fifth metatarsal bases extending to the lateral ankle and to the sinus tarsi area of the right foot.  There is localized swelling without warmth to this area mild guarding due to pain unchanged from prior.   Assessment and Plan: Problem List Items Addressed This Visit   None Visit Diagnoses     Neuritis    -  Primary   Tendonitis       Right foot pain       Chronic foot pain, right       Relevant Medications   gabapentin (NEURONTIN) 100 MG capsule   HYDROcodone-acetaminophen (NORCO) 10-325 MG tablet        -Complete examination performed -Previous MRI is negative -Discussed treatment options for what I would suspect now is more chronic pain since patient has not responded to any treatment I think this could be more of a nerve problem -Rx gabapentin at bedtime to see if this will help advised patient if she has no side effects we may have to increase the dose to be effective -Prescribed at this time only hydrocodone for her to have for any pain not relieved by gabapentin -Continue with cam boot since it is helpful when patient has to do a lot of walking and standing  -Recommend for physical therapy to do a trial of aquatics meanwhile patient to return in 6 weeks or sooner if problems or issues arise Landis Martins, DPM

## 2021-04-02 ENCOUNTER — Ambulatory Visit: Payer: Managed Care, Other (non HMO)

## 2021-04-02 NOTE — Therapy (Incomplete)
OUTPATIENT PHYSICAL THERAPY TREATMENT NOTE   Patient Name: Krista Hogan MRN: [id number] DOB:[birth year], 41 y.o., female Today's Date: 04/02/2021  PCP: Minette Brine, FNP REFERRING PROVIDER: Minette Brine, FNP     Past Medical History:  Diagnosis Date   DUB (dysfunctional uterine bleeding)    Graves disease 06/2016   History of abnormal cervical Pap smear    2011   History of HPV infection    Uterine fibroid    Wears glasses    Past Surgical History:  Procedure Laterality Date   CESAREAN SECTION N/A 01/06/2018   Procedure: CESAREAN SECTION;  Surgeon: Caren Macadam, MD;  Location: [place];  Service: Obstetrics;  Laterality: N/A;   EXCISION OF SKIN TAG  11/22/2014   Procedure: EXCISION OF SKIN TAG VULVA;  Surgeon: Governor Specking, MD;  Location: [place];  Service: Gynecology;;   LAPAROSCOPIC GELPORT ASSISTED MYOMECTOMY N/A 11/22/2014   Procedure: LAPAROSCOPIC GELPORT ASSISTED MYOMECTOMY WITH CHROMOTUBATION;  Surgeon: Governor Specking, MD;  Location: [place];  Service: Gynecology;  Laterality: N/A;   LAPAROSCOPY N/A 11/22/2014   Procedure: LAPAROSCOPY DIAGNOSTIC;  Surgeon: Governor Specking, MD;  Location: [place];  Service: Gynecology;  Laterality: N/A;   LEEP  2011   TONSILLECTOMY AND ADENOIDECTOMY  age 62   WISDOM TOOTH EXTRACTION  2006   Patient Active Problem List   Diagnosis Date Noted   Right lower quadrant pain 03/29/2020   Chronic idiopathic constipation 03/29/2020   Acute anal fissure 03/29/2020   Irritant dermatitis 03/21/2020   Itching of both hands 12/27/2019   Acne scarring 11/02/2019   Acne vulgaris 11/02/2019   Hyperpigmentation 11/02/2019   Gastroesophageal reflux disease without esophagitis 08/29/2019   Globus pharyngeus 08/29/2019   Nasal turbinate hypertrophy 08/29/2019   Rhinitis, chronic 08/29/2019   Throat irritation 08/29/2019   Palpitations 02/25/2019   Dysesthesia  02/01/2019   Groin pain 02/01/2019   Obesity 02/01/2019   Postpartum hypertension 01/10/2018   S/P repeat low transverse C-section 01/06/2018   Supervision of other normal pregnancy, antepartum 07/08/2017   Graves disease 10/01/2016   Genital herpes simplex 07/29/2016   Cyst of ovary 07/28/2016   Genital warts 07/28/2016   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  PERTINENT HISTORY: Hx of lateral R foot/ankle pain since August 2022  PRECAUTIONS: None  SUBJECTIVE:  ***  Pain: Are you having pain? Yes NPRS: 5/10 Pain Location: R anterior foot Pain Frequency/Description: intermittent  Aggravating Factors: walking, driving, standing  Relieving Factors: rest   OBJECTIVE:    Interventions: Therapeutic Exercise: NuStep lvl 5 LE only x 4 min while taking subjective Seated heel raise 2x20  R calf stretchw/ towel 2x30" R ankle 4-way 2x15 YTB Tandem stance R back 2x30" Slant board stretch 2x30" Manual Therapy: STM to R foot extensor bundle Passive stretch to R foot extensors   Patient Education: Education details: elevation and massage for decreasing R ankle swelling Person educated: Patient Education method: Customer service manager Education comprehension: verbalized understanding and returned demonstration   Home Exercise Program: Access Code: [email or web address]  ASSESSMENT: ***   GOALS: Goals reviewed with patient? No   SHORT TERM GOALS:   STG Name Target Date Goal status  1 Pt will be compliant and knowledgeable with  initial HEP for improved comfort and carryover Baseline: initial HEP given 04/04/2021 INITIAL  2 Pt will self report R foot/ankle pain no greater than 6/10 for improved comfort and functional ability Baseline: 10/10 R foot/ankle pain at worst 04/04/2021 INITIAL    LONG TERM GOALS:    LTG Name Target Date Goal status  1 Pt will improve FOTO function score to no less than 62% as proxy for functional improvement  Baseline: 34% function 05/09/2021 INITIAL  2 Pt will self report R foot/ankle pain no greater than 3/10 for improved comfort and functional ability  Baseline: 10/10 R foot/ankle pain at worst 05/09/2021 INITIAL  3 Pt will be able hold SLS on R LE no less than 15 seconds for improved balance and mobility Baseline: unable 05/09/2021 INITIAL  4 Pt will improve R ankle DF no less than 10 deg for improved functional mobility Baseline: 6 deg 05/09/2021 INITIAL  5 Pt will improve R ankle MMT to no less than 4/5 for all tested motions in order to improve comfort and functional ability Baseline: MMT Right 03/14/2021 Left 03/14/2021  Ankle dorsiflexion 3/5    Ankle plantarflexion 3+/5    Ankle inversion 3+/5    Ankle eversion 2+/5     05/09/2021 INITIAL    PLAN: PT FREQUENCY: 2x/week   PT DURATION: 8 weeks   PLANNED INTERVENTIONS: Therapeutic exercises, Therapeutic activity, Neuro Muscular re-education, Balance training, Gait training, Patient/Family education, Joint mobilization, Aquatic Therapy, Dry Needling, Cryotherapy, Moist heat, Taping, Vasopneumatic device, and Manual therapy   PLAN FOR NEXT SESSION: assess response to HEP; trial tandem stance; progress gait and R ankle strengthening as able    Ward Chatters 04/02/2021, 1:55 PM

## 2021-04-02 NOTE — Therapy (Signed)
OUTPATIENT PHYSICAL THERAPY TREATMENT NOTE   Patient Name: Krista Hogan MRN: [id number] DOB:17-Aug-1980, 41 y.o., female Today's Date: 04/03/2021  PCP: Minette Brine, FNP REFERRING PROVIDER: Landis Martins, DPM     Past Medical History:  Diagnosis Date   DUB (dysfunctional uterine bleeding)    Graves disease 06/2016   History of abnormal cervical Pap smear    2011   History of HPV infection    Uterine fibroid    Wears glasses    Past Surgical History:  Procedure Laterality Date   CESAREAN SECTION N/A 01/06/2018   Procedure: CESAREAN SECTION;  Surgeon: Caren Macadam, MD;  Location: [place];  Service: Obstetrics;  Laterality: N/A;   EXCISION OF SKIN TAG  11/22/2014   Procedure: EXCISION OF SKIN TAG VULVA;  Surgeon: Governor Specking, MD;  Location: [place];  Service: Gynecology;;   LAPAROSCOPIC GELPORT ASSISTED MYOMECTOMY N/A 11/22/2014   Procedure: LAPAROSCOPIC GELPORT ASSISTED MYOMECTOMY WITH CHROMOTUBATION;  Surgeon: Governor Specking, MD;  Location: [place];  Service: Gynecology;  Laterality: N/A;   LAPAROSCOPY N/A 11/22/2014   Procedure: LAPAROSCOPY DIAGNOSTIC;  Surgeon: Governor Specking, MD;  Location: [place];  Service: Gynecology;  Laterality: N/A;   LEEP  2011   TONSILLECTOMY AND ADENOIDECTOMY  age 40   WISDOM TOOTH EXTRACTION  2006   Patient Active Problem List   Diagnosis Date Noted   Right lower quadrant pain 03/29/2020   Chronic idiopathic constipation 03/29/2020   Acute anal fissure 03/29/2020   Irritant dermatitis 03/21/2020   Itching of both hands 12/27/2019   Acne scarring 11/02/2019   Acne vulgaris 11/02/2019   Hyperpigmentation 11/02/2019   Gastroesophageal reflux disease without esophagitis 08/29/2019   Globus pharyngeus 08/29/2019   Nasal turbinate hypertrophy 08/29/2019   Rhinitis, chronic 08/29/2019   Throat irritation 08/29/2019   Palpitations 02/25/2019   Dysesthesia  02/01/2019   Groin pain 02/01/2019   Obesity 02/01/2019   Postpartum hypertension 01/10/2018   S/P repeat low transverse C-section 01/06/2018   Supervision of other normal pregnancy, antepartum 07/08/2017   Graves disease 10/01/2016   Genital herpes simplex 07/29/2016   Cyst of ovary 07/28/2016   Genital warts 07/28/2016   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   Migraine 07/28/2016   Hypothyroidism 12/17/2015   Mucosal irritation of oral cavity 12/17/2015   Otalgia, right 12/17/2015   Chronic pelvic pain in female 11/15/2015   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    REFERRING DIAG:  M77.9 (ICD-10-CM) - Capsulitis M77.8 (ICD-10-CM) - Extensor tendinitis of foot M79.671 (ICD-10-CM) - Right foot pain M25.571 (ICD-10-CM) - Sinus tarsi syndrome of right foot  THERAPY DIAG:  Pain in right ankle and joints of right foot  Muscle weakness (generalized)  Other abnormalities of gait and mobility  Unsteadiness on feet  PERTINENT HISTORY: Hx of lateral R foot/ankle pain since August 2022  PRECAUTIONS: None  SUBJECTIVE: Pt enters pool area without wearing R LE brace and 5/10 pain.  Pt without AD  Pain: Are you having pain? Yes NPRS: 5/10 Pain Location: R anterior foot Pain Frequency/Description: intermittent  Aggravating Factors: walking, driving, standing  Relieving Factors: rest   OBJECTIVE:   [facility] Adult PT Treatment:                                                DATE: 04-02-21 Aquatic Therapy  Aquatic therapy at [address] - therapeutic pool temp 85 degrees. Pt enters pool area without R ankle brace and no AD but with antalgic gait and decreased stance time on R ankle. Treatment took place in water 3.8 to  4 ft 8 in.feet deep depending upon activity.  Pt entered and exited the pool via stair and handrails independently Ambulation across pool ( 15 ft) sided stepping and backward walking using aquatic barbells while ambulating to increase  abdominal engagement with exercises. Pt encouraged to take longer stride lengths Toe yoga on step to flex/ext great toe in opposition to digits 2 -5 as much as comfortable in water. Buoyancy of water assisted in lifting 2-5 digits Runners stretch on R and L 30 sec x 2 and then stretch in to runners hamstring stretch 2 x 30 sec R and L Figure 4 squat stretch with UE support for R and L x 60 sec each ( felt good to pt) Cat camel with pool noodle On edge of pool with bil UE support LE exercises Hip abd/add R/Leach and then using 1 UE support Hip ext/flex with knee straight  pt needing VC and TC for correct execution and sequencing Marching knee/hip 90/90  Marching with added pool noodle for increased resistance  Heel raise bil and then SL heel lift on R and L 20 x each Wt shifting with pool noodle under right arch Bicycle while sitting on edge of pool  Squats  Aquastretch for R ankle using toe grip distraction of digits, ankle grip , talocrural distraction     [facility] Adult PT Treatment:                                                DATE: 03-26-21  Interventions: Therapeutic Exercise: NuStep lvl 5 LE only x 4 min while taking subjective Seated heel raise 2x20  R calf stretchw/ towel 2x30" R ankle 4-way 2x15 YTB Tandem stance R back 2x30" Slant board stretch 2x30" Manual Therapy: STM to R foot extensor bundle Passive stretch to R foot extensors   Patient Education:  Education details: Intro to [employer] and therapeutic principles of water 04-03-21 Person educated: Patient Education method: Explanation and Demonstration Education comprehension: verbalized understanding and returned demonstration  Previous to 02-01-22 Education details: elevation and massage for decreasing R ankle swelling Person educated: Patient Education method: Customer service manager Education comprehension: verbalized understanding and returned demonstration   Home Exercise Program: Access Code:  [email or web address]   ASSESSMENT: Pt enters pool area with antalgic gait. Pt enters water independently with 5/10 pain.  During session with manual/aqua stretch and exercise increased to 7/10  but at end of session 4to 5/10 by pt report.  Pt was able to work/exercise the entire session without need for stop due to pain. Pt was educated on benefits of aquatics/therapeutic benefits of water. Exercises concentrated on  improving distal R LE strength    Pt is unable to tolerate land due to pain or inability to move freely on land without pain and substitution/compensations  Water will reduce edema around joints through hydrostatic pressure that allows for improved ROM. Water will allow for reduced gait deviation due to reduced joint loading through buoyancy to help patient improve posture without excess stress and pain.   Will continue to progress exercises as tolerated per POC. GOALS: Goals reviewed with patient? No  SHORT TERM GOALS:   STG Name Target Date Goal status  1 Pt will be compliant and knowledgeable with initial HEP for improved comfort and carryover Baseline: initial HEP given 04/04/2021 INITIAL  2 Pt will self report R foot/ankle pain no greater than 6/10 for improved comfort and functional ability Baseline: 10/10 R foot/ankle pain at worst 04/04/2021 INITIAL    LONG TERM GOALS:    LTG Name Target Date Goal status  1 Pt will improve FOTO function score to no less than 62% as proxy for functional improvement  Baseline: 34% function 05/09/2021 INITIAL  2 Pt will self report R foot/ankle pain no greater than 3/10 for improved comfort and functional ability  Baseline: 10/10 R foot/ankle pain at worst 05/09/2021 INITIAL  3 Pt will be able hold SLS on R LE no less than 15 seconds for improved balance and mobility Baseline: unable 05/09/2021 INITIAL  4 Pt will improve R ankle DF no less than 10 deg for improved functional mobility Baseline: 6 deg 05/09/2021 INITIAL  5 Pt will improve R ankle MMT to no  less than 4/5 for all tested motions in order to improve comfort and functional ability Baseline: MMT Right 03/14/2021 Left 03/14/2021  Ankle dorsiflexion 3/5    Ankle plantarflexion 3+/5    Ankle inversion 3+/5    Ankle eversion 2+/5     05/09/2021 INITIAL    PLAN: PT FREQUENCY: 2x/week   PT DURATION: 8 weeks   PLANNED INTERVENTIONS: Therapeutic exercises, Therapeutic activity, Neuro Muscular re-education, Balance training, Gait training, Patient/Family education, Joint mobilization, Aquatic Therapy, Dry Needling, Cryotherapy, Moist heat, Taping, Vasopneumatic device, and Manual therapy   PLAN FOR NEXT SESSION:  assess response to aquatics, assess response to HEP; trial tandem stance; progress gait and R ankle strengthening as able    Voncille Lo, PT, [place] Certified Exercise Expert for the Aging Adult  04/03/21 2:40 PM Phone: [phone] Fax: [phone]

## 2021-04-03 ENCOUNTER — Encounter: Payer: Self-pay | Admitting: Physical Therapy

## 2021-04-03 ENCOUNTER — Other Ambulatory Visit: Payer: Self-pay

## 2021-04-03 ENCOUNTER — Ambulatory Visit: Payer: Managed Care, Other (non HMO) | Admitting: Physical Therapy

## 2021-04-03 DIAGNOSIS — M25571 Pain in right ankle and joints of right foot: Secondary | ICD-10-CM | POA: Diagnosis not present

## 2021-04-03 DIAGNOSIS — R2681 Unsteadiness on feet: Secondary | ICD-10-CM

## 2021-04-03 DIAGNOSIS — M6281 Muscle weakness (generalized): Secondary | ICD-10-CM

## 2021-04-03 DIAGNOSIS — R2689 Other abnormalities of gait and mobility: Secondary | ICD-10-CM

## 2021-04-09 ENCOUNTER — Ambulatory Visit: Payer: Managed Care, Other (non HMO)

## 2021-04-09 ENCOUNTER — Other Ambulatory Visit: Payer: Self-pay

## 2021-04-09 DIAGNOSIS — R2681 Unsteadiness on feet: Secondary | ICD-10-CM

## 2021-04-09 DIAGNOSIS — R2689 Other abnormalities of gait and mobility: Secondary | ICD-10-CM

## 2021-04-09 DIAGNOSIS — M25571 Pain in right ankle and joints of right foot: Secondary | ICD-10-CM

## 2021-04-09 DIAGNOSIS — M6281 Muscle weakness (generalized): Secondary | ICD-10-CM

## 2021-04-09 NOTE — Therapy (Signed)
OUTPATIENT PHYSICAL THERAPY TREATMENT NOTE   Patient Name: Krista Hogan MRN: [id number] DOB:February 17, 1981, 41 y.o., female Today's Date: 04/09/2021  PCP: Minette Brine, FNP REFERRING PROVIDER: Minette Brine, [place]   PT End of Session - 04/09/21 1527     Visit Number 4    Number of Visits 17    Date for PT Re-Evaluation 05/09/21    Authorization Type [employer] Managed    PT Start Time 1530    PT Stop Time 1610    PT Time Calculation (min) 40 min    Activity Tolerance Patient tolerated treatment well    Behavior During Therapy [facility] for tasks assessed/performed              Past Medical History:  Diagnosis Date   DUB (dysfunctional uterine bleeding)    Graves disease 06/2016   History of abnormal cervical Pap smear    2011   History of HPV infection    Uterine fibroid    Wears glasses    Past Surgical History:  Procedure Laterality Date   CESAREAN SECTION N/A 01/06/2018   Procedure: CESAREAN SECTION;  Surgeon: Caren Macadam, MD;  Location: [place];  Service: Obstetrics;  Laterality: N/A;   EXCISION OF SKIN TAG  11/22/2014   Procedure: EXCISION OF SKIN TAG VULVA;  Surgeon: Governor Specking, MD;  Location: [place];  Service: Gynecology;;   LAPAROSCOPIC GELPORT ASSISTED MYOMECTOMY N/A 11/22/2014   Procedure: LAPAROSCOPIC GELPORT ASSISTED MYOMECTOMY WITH CHROMOTUBATION;  Surgeon: Governor Specking, MD;  Location: [place];  Service: Gynecology;  Laterality: N/A;   LAPAROSCOPY N/A 11/22/2014   Procedure: LAPAROSCOPY DIAGNOSTIC;  Surgeon: Governor Specking, MD;  Location: [place];  Service: Gynecology;  Laterality: N/A;   LEEP  2011   TONSILLECTOMY AND ADENOIDECTOMY  age 66   WISDOM TOOTH EXTRACTION  2006   Patient Active Problem List   Diagnosis Date Noted   Right lower quadrant pain 03/29/2020   Chronic idiopathic constipation 03/29/2020   Acute anal fissure 03/29/2020   Irritant dermatitis 03/21/2020    Itching of both hands 12/27/2019   Acne scarring 11/02/2019   Acne vulgaris 11/02/2019   Hyperpigmentation 11/02/2019   Gastroesophageal reflux disease without esophagitis 08/29/2019   Globus pharyngeus 08/29/2019   Nasal turbinate hypertrophy 08/29/2019   Rhinitis, chronic 08/29/2019   Throat irritation 08/29/2019   Palpitations 02/25/2019   Dysesthesia 02/01/2019   Groin pain 02/01/2019   Obesity 02/01/2019   Postpartum hypertension 01/10/2018   S/P repeat low transverse C-section 01/06/2018   Supervision of other normal pregnancy, antepartum 07/08/2017   Graves disease 10/01/2016   Genital herpes simplex 07/29/2016   Cyst of ovary 07/28/2016   Genital warts 07/28/2016   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   Migraine 07/28/2016   Hypothyroidism 12/17/2015   Mucosal irritation of oral cavity 12/17/2015   Otalgia, right 12/17/2015   Chronic pelvic pain in female 11/15/2015   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    REFERRING DIAG:  M77.9 (ICD-10-CM) - Capsulitis M77.8 (ICD-10-CM) - Extensor tendinitis of foot M79.671 (ICD-10-CM) - Right foot pain M25.571 (ICD-10-CM) - Sinus tarsi syndrome of right foot  THERAPY DIAG:  Pain in right ankle and joints of right foot  Muscle weakness (generalized)  Other abnormalities of gait and mobility  Unsteadiness on feet  PERTINENT HISTORY: Hx of lateral R foot/ankle pain since August 2022  PRECAUTIONS: None  SUBJECTIVE:  Pt presents to PT with continued reports of R foot/ankle pain. Had a  good session in aquatics last session. Has been compliant with HEP with no adverse effect. She is ready to begin PT at this time.   Pain: Are you having pain? Yes NPRS: 5/10 Pain Location: R anterior foot Pain Frequency/Description: intermittent  Aggravating Factors: walking, driving, standing  Relieving Factors: rest   OBJECTIVE:   Today's Treatment: [facility] Adult PT Treatment:                                                 DATE: 04-09-2021  Interventions: Therapeutic Exercise: NuStep lvl 5 LE only x 4 min while taking subjective Seated heel raise 2x20 10# KB on knee R calf stretch w/ towel 2x30" R ankle 4-way 2x15 RTB Tandem stance R back 2x30" (NT) Slant board stretch 2x30" (NT) Manual Therapy: STM to R foot extensor bundle Passive stretch to R foot extensors IASTM to R gastroc  [facility] Adult PT Treatment:                                                DATE: 04-02-2021 Aquatic Therapy Aquatic therapy at [address] - therapeutic pool temp 85 degrees. Pt enters pool area without R ankle brace and no AD but with antalgic gait and decreased stance time on R ankle. Treatment took place in water 3.8 to  4 ft 8 in.feet deep depending upon activity.  Pt entered and exited the pool via stair and handrails independently Ambulation across pool ( 15 ft) sided stepping and backward walking using aquatic barbells while ambulating to increase abdominal engagement with exercises. Pt encouraged to take longer stride lengths Toe yoga on step to flex/ext great toe in opposition to digits 2 -5 as much as comfortable in water. Buoyancy of water assisted in lifting 2-5 digits Runners stretch on R and L 30 sec x 2 and then stretch in to runners hamstring stretch 2 x 30 sec R and L Figure 4 squat stretch with UE support for R and L x 60 sec each ( felt good to pt) Cat camel with pool noodle On edge of pool with bil UE support LE exercises Hip abd/add R/Leach and then using 1 UE support Hip ext/flex with knee straight  pt needing VC and TC for correct execution and sequencing Marching knee/hip 90/90  Marching with added pool noodle for increased resistance  Heel raise bil and then SL heel lift on R and L 20 x each Wt shifting with pool noodle under right arch Bicycle while sitting on edge of pool  Squats  Aquastretch for R ankle using toe grip distraction of digits, ankle grip , talocrural distraction      [facility] Adult PT Treatment:                                                DATE: 03-26-2021  Interventions: Therapeutic Exercise: NuStep lvl 5 LE only x 4 min while taking subjective Seated heel raise 2x20  R calf stretchw/ towel 2x30" R ankle 4-way 2x15 YTB Tandem stance R back 2x30" Slant board stretch 2x30" Manual Therapy: STM to R  foot extensor bundle Passive stretch to R foot extensors   Patient Education:  Education details: Intro to [employer] and therapeutic principles of water 04-03-21 Person educated: Patient Education method: Customer service manager Education comprehension: verbalized understanding and returned demonstration   Home Exercise Program: Access Code: [email or web address]   ASSESSMENT: Pt was able to complete prescribed exercises with no adverse effect or increase in pain. She responded well to manual therapy interventions, reporting 3/10 pain post session. Therapy today focused on improving distal R LE strength and reducing soft tissue pain. She continues to benefit from skilled PT services and will continue to be seen and progressed as tolerated.   GOALS: Goals reviewed with patient? No   SHORT TERM GOALS:   STG Name Target Date Goal status  1 Pt will be compliant and knowledgeable with initial HEP for improved comfort and carryover Baseline: initial HEP given 04/04/2021 INITIAL  2 Pt will self report R foot/ankle pain no greater than 6/10 for improved comfort and functional ability Baseline: 10/10 R foot/ankle pain at worst 04/04/2021 INITIAL    LONG TERM GOALS:    LTG Name Target Date Goal status  1 Pt will improve FOTO function score to no less than 62% as proxy for functional improvement  Baseline: 34% function 05/09/2021 INITIAL  2 Pt will self report R foot/ankle pain no greater than 3/10 for improved comfort and functional ability  Baseline: 10/10 R foot/ankle pain at worst 05/09/2021 INITIAL  3 Pt will be able hold SLS on R LE no less than 15 seconds for  improved balance and mobility Baseline: unable 05/09/2021 INITIAL  4 Pt will improve R ankle DF no less than 10 deg for improved functional mobility Baseline: 6 deg 05/09/2021 INITIAL  5 Pt will improve R ankle MMT to no less than 4/5 for all tested motions in order to improve comfort and functional ability Baseline: MMT Right 03/14/2021 Left 03/14/2021  Ankle dorsiflexion 3/5    Ankle plantarflexion 3+/5    Ankle inversion 3+/5    Ankle eversion 2+/5     05/09/2021 INITIAL    PLAN: PT FREQUENCY: 2x/week   PT DURATION: 8 weeks   PLANNED INTERVENTIONS: Therapeutic exercises, Therapeutic activity, Neuro Muscular re-education, Balance training, Gait training, Patient/Family education, Joint mobilization, Aquatic Therapy, Dry Needling, Cryotherapy, Moist heat, Taping, Vasopneumatic device, and Manual therapy   PLAN FOR NEXT SESSION:  assess response to aquatics, assess response to HEP; trial tandem stance; progress gait and R ankle strengthening as able   Ward Chatters, PT 04/09/21 6:41 PM

## 2021-04-10 ENCOUNTER — Ambulatory Visit: Payer: Managed Care, Other (non HMO) | Admitting: Physical Therapy

## 2021-04-15 ENCOUNTER — Ambulatory Visit: Payer: Managed Care, Other (non HMO)

## 2021-04-17 ENCOUNTER — Other Ambulatory Visit: Payer: Self-pay

## 2021-04-17 ENCOUNTER — Encounter (HOSPITAL_BASED_OUTPATIENT_CLINIC_OR_DEPARTMENT_OTHER): Payer: Self-pay | Admitting: Physical Therapy

## 2021-04-17 ENCOUNTER — Ambulatory Visit (HOSPITAL_BASED_OUTPATIENT_CLINIC_OR_DEPARTMENT_OTHER): Payer: Managed Care, Other (non HMO) | Attending: Sports Medicine | Admitting: Physical Therapy

## 2021-04-17 DIAGNOSIS — M79671 Pain in right foot: Secondary | ICD-10-CM | POA: Insufficient documentation

## 2021-04-17 DIAGNOSIS — R2689 Other abnormalities of gait and mobility: Secondary | ICD-10-CM

## 2021-04-17 DIAGNOSIS — G8929 Other chronic pain: Secondary | ICD-10-CM | POA: Insufficient documentation

## 2021-04-17 DIAGNOSIS — M25571 Pain in right ankle and joints of right foot: Secondary | ICD-10-CM | POA: Insufficient documentation

## 2021-04-17 DIAGNOSIS — R2681 Unsteadiness on feet: Secondary | ICD-10-CM

## 2021-04-17 DIAGNOSIS — M779 Enthesopathy, unspecified: Secondary | ICD-10-CM | POA: Diagnosis not present

## 2021-04-17 DIAGNOSIS — M778 Other enthesopathies, not elsewhere classified: Secondary | ICD-10-CM | POA: Insufficient documentation

## 2021-04-17 DIAGNOSIS — M6281 Muscle weakness (generalized): Secondary | ICD-10-CM

## 2021-04-17 NOTE — Therapy (Signed)
OUTPATIENT PHYSICAL THERAPY TREATMENT NOTE   Patient Name: Krista Hogan MRN: [id number] DOB:Sep 18, 1980, 41 y.o., female Today's Date: 04/17/2021  PCP: Minette Brine, FNP REFERRING PROVIDER: Minette Brine, FNP   PT End of Session - 04/17/21 1213     Visit Number 5    Number of Visits 17    Date for PT Re-Evaluation 05/09/21    Authorization Type [employer] Managed    PT Start Time 1209    PT Stop Time 1250    PT Time Calculation (min) 41 min    Activity Tolerance Patient tolerated treatment well    Behavior During Therapy [facility] for tasks assessed/performed              Past Medical History:  Diagnosis Date   DUB (dysfunctional uterine bleeding)    Graves disease 06/2016   History of abnormal cervical Pap smear    2011   History of HPV infection    Uterine fibroid    Wears glasses    Past Surgical History:  Procedure Laterality Date   CESAREAN SECTION N/A 01/06/2018   Procedure: CESAREAN SECTION;  Surgeon: Caren Macadam, MD;  Location: [place];  Service: Obstetrics;  Laterality: N/A;   EXCISION OF SKIN TAG  11/22/2014   Procedure: EXCISION OF SKIN TAG VULVA;  Surgeon: Governor Specking, MD;  Location: [place];  Service: Gynecology;;   LAPAROSCOPIC GELPORT ASSISTED MYOMECTOMY N/A 11/22/2014   Procedure: LAPAROSCOPIC GELPORT ASSISTED MYOMECTOMY WITH CHROMOTUBATION;  Surgeon: Governor Specking, MD;  Location: [place];  Service: Gynecology;  Laterality: N/A;   LAPAROSCOPY N/A 11/22/2014   Procedure: LAPAROSCOPY DIAGNOSTIC;  Surgeon: Governor Specking, MD;  Location: [place];  Service: Gynecology;  Laterality: N/A;   LEEP  2011   TONSILLECTOMY AND ADENOIDECTOMY  age 37   WISDOM TOOTH EXTRACTION  2006   Patient Active Problem List   Diagnosis Date Noted   Right lower quadrant pain 03/29/2020   Chronic idiopathic constipation 03/29/2020   Acute anal fissure 03/29/2020   Irritant dermatitis 03/21/2020    Itching of both hands 12/27/2019   Acne scarring 11/02/2019   Acne vulgaris 11/02/2019   Hyperpigmentation 11/02/2019   Gastroesophageal reflux disease without esophagitis 08/29/2019   Globus pharyngeus 08/29/2019   Nasal turbinate hypertrophy 08/29/2019   Rhinitis, chronic 08/29/2019   Throat irritation 08/29/2019   Palpitations 02/25/2019   Dysesthesia 02/01/2019   Groin pain 02/01/2019   Obesity 02/01/2019   Postpartum hypertension 01/10/2018   S/P repeat low transverse C-section 01/06/2018   Supervision of other normal pregnancy, antepartum 07/08/2017   Graves disease 10/01/2016   Genital herpes simplex 07/29/2016   Cyst of ovary 07/28/2016   Genital warts 07/28/2016   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   Migraine 07/28/2016   Hypothyroidism 12/17/2015   Mucosal irritation of oral cavity 12/17/2015   Otalgia, right 12/17/2015   Chronic pelvic pain in female 11/15/2015   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    REFERRING DIAG:  M77.9 (ICD-10-CM) - Capsulitis M77.8 (ICD-10-CM) - Extensor tendinitis of foot M79.671 (ICD-10-CM) - Right foot pain M25.571 (ICD-10-CM) - Sinus tarsi syndrome of right foot  THERAPY DIAG:  Pain in right ankle and joints of right foot  Muscle weakness (generalized)  Other abnormalities of gait and mobility  Unsteadiness on feet  PERTINENT HISTORY: Hx of lateral R foot/ankle pain since August 2022  PRECAUTIONS: None  SUBJECTIVE:  Pt presents to PT with continued reports of R foot/ankle pain.  States  she was standing on right foot in shower and had a sharp pain lateral aspect of right ankle "felt like it was breaking".   Pain: Are you having pain? Yes NPRS: 7/10 Pain Location: R anterior foot Pain Frequency/Description: intermittent  Aggravating Factors: walking, driving, standing  Relieving Factors: rest   OBJECTIVE:   Today's Treatment: [place] Adult PT Treatment:                                                DATE:  04-17-2021 Aquatic therapy at [address] - therapeutic pool temp 86 degrees. Pt enters pool area without R ankle brace and no AD but with antalgic gait and decreased stance time on R ankle.  Reviewed current function, pain levels, response to prior Rx, and HEP compliance.   Walking forward, backward and side stepping 4 widths each  Seated stretching gastroc, hamstring and adductors 3x30 sec bilat -Flutter at hip with DF/pf 3x30 -add/abd 3x20 ankles in df   Standing: Cues for abdominal bracing for core engagement; bilateral supported ue with yellow hand buoys DF and PF   2x20 -SL PF R/L 2x12 -Marching 3x20 -add/abd 2x10 -hip extension 2x10  Tandem walking forward and backward 2 widths each  Pt requires buoyancy for support and to offload joints with strengthening exercises. Viscosity of the water is needed for resistance of strengthening; water current perturbations provides challenge to standing balance unsupported, requiring increased core activation.      Today's Treatment: [place] Adult PT Treatment:                                                DATE: 04-09-2021  Interventions: Therapeutic Exercise: NuStep lvl 5 LE only x 4 min while taking subjective Seated heel raise 2x20 10# KB on knee R calf stretch w/ towel 2x30" R ankle 4-way 2x15 RTB Tandem stance R back 2x30" (NT) Slant board stretch 2x30" (NT) Manual Therapy: STM to R foot extensor bundle Passive stretch to R foot extensors IASTM to R gastroc  [facility] Adult PT Treatment:                                                DATE: 04-02-2021 Aquatic Therapy Aquatic therapy at [address] - therapeutic pool temp 85 degrees. Pt enters pool area without R ankle brace and no AD but with antalgic gait and decreased stance time on R ankle. Treatment took place in water 3.8 to  4 ft 8 in.feet deep depending upon activity.  Pt entered and exited the pool via stair and handrails  independently Ambulation across pool ( 15 ft) sided stepping and backward walking using aquatic barbells while ambulating to increase abdominal engagement with exercises. Pt encouraged to take longer stride lengths Toe yoga on step to flex/ext great toe in opposition to digits 2 -5 as much as comfortable in water. Buoyancy of water assisted in lifting 2-5 digits Runners stretch on R and L 30 sec x 2 and then stretch in to runners hamstring stretch 2 x 30 sec R and L Figure 4 squat stretch  with UE support for R and L x 60 sec each ( felt good to pt) Cat camel with pool noodle On edge of pool with bil UE support LE exercises Hip abd/add R/Leach and then using 1 UE support Hip ext/flex with knee straight  pt needing VC and TC for correct execution and sequencing Marching knee/hip 90/90  Marching with added pool noodle for increased resistance  Heel raise bil and then SL heel lift on R and L 20 x each Wt shifting with pool noodle under right arch Bicycle while sitting on edge of pool  Squats  Aquastretch for R ankle using toe grip distraction of digits, ankle grip , talocrural distraction     [facility] Adult PT Treatment:                                                DATE: 03-26-2021  Interventions: Therapeutic Exercise: NuStep lvl 5 LE only x 4 min while taking subjective Seated heel raise 2x20  R calf stretchw/ towel 2x30" R ankle 4-way 2x15 YTB Tandem stance R back 2x30" Slant board stretch 2x30" Manual Therapy: STM to R foot extensor bundle Passive stretch to R foot extensors   Patient Education:  Education details: Intro to [employer] and therapeutic principles of water 04-03-21 Person educated: Patient Education method: Customer service manager Education comprehension: verbalized understanding and returned demonstration   Home Exercise Program: Access Code: [email or web address]   ASSESSMENT: Pt with some increase in pain right ankle after incident in shower.  Able to reproduce pain  unloaded with eversion. Discussed use of boot when working or sleeve. She reports sleeve she has caused anterior ankle discomfort from being too tight. Focus today on ROM right ankle with added proximal strengthening and core engagement. She continues to benefit from aquatic therapy for improvement in strength and pain exercising in pain less environment.    GOALS: Goals reviewed with patient? No   SHORT TERM GOALS:   STG Name Target Date Goal status  1 Pt will be compliant and knowledgeable with initial HEP for improved comfort and carryover Baseline: initial HEP given 04/04/2021 INITIAL  2 Pt will self report R foot/ankle pain no greater than 6/10 for improved comfort and functional ability Baseline: 10/10 R foot/ankle pain at worst 04/04/2021 INITIAL    LONG TERM GOALS:    LTG Name Target Date Goal status  1 Pt will improve FOTO function score to no less than 62% as proxy for functional improvement  Baseline: 34% function 05/09/2021 INITIAL  2 Pt will self report R foot/ankle pain no greater than 3/10 for improved comfort and functional ability  Baseline: 10/10 R foot/ankle pain at worst 05/09/2021 INITIAL  3 Pt will be able hold SLS on R LE no less than 15 seconds for improved balance and mobility Baseline: unable 05/09/2021 INITIAL  4 Pt will improve R ankle DF no less than 10 deg for improved functional mobility Baseline: 6 deg 05/09/2021 INITIAL  5 Pt will improve R ankle MMT to no less than 4/5 for all tested motions in order to improve comfort and functional ability Baseline: MMT Right 03/14/2021 Left 03/14/2021  Ankle dorsiflexion 3/5    Ankle plantarflexion 3+/5    Ankle inversion 3+/5    Ankle eversion 2+/5     05/09/2021 INITIAL    PLAN: PT FREQUENCY: 2x/week   PT DURATION: 8  weeks   PLANNED INTERVENTIONS: Therapeutic exercises, Therapeutic activity, Neuro Muscular re-education, Balance training, Gait training, Patient/Family education, Joint mobilization, Aquatic Therapy, Dry  Needling, Cryotherapy, Moist heat, Taping, Vasopneumatic device, and Manual therapy   PLAN FOR NEXT SESSION:  assess response to aquatics, assess response to HEP; trial tandem stance; progress gait and R ankle strengthening as able    04/17/21 1:18 PM

## 2021-04-22 ENCOUNTER — Encounter: Payer: Self-pay | Admitting: Sports Medicine

## 2021-04-23 ENCOUNTER — Telehealth: Payer: Self-pay | Admitting: *Deleted

## 2021-04-23 ENCOUNTER — Other Ambulatory Visit: Payer: Self-pay | Admitting: Sports Medicine

## 2021-04-23 ENCOUNTER — Ambulatory Visit: Payer: Managed Care, Other (non HMO) | Attending: Sports Medicine

## 2021-04-23 ENCOUNTER — Other Ambulatory Visit: Payer: Self-pay

## 2021-04-23 DIAGNOSIS — R2689 Other abnormalities of gait and mobility: Secondary | ICD-10-CM | POA: Insufficient documentation

## 2021-04-23 DIAGNOSIS — M6281 Muscle weakness (generalized): Secondary | ICD-10-CM | POA: Insufficient documentation

## 2021-04-23 DIAGNOSIS — M25571 Pain in right ankle and joints of right foot: Secondary | ICD-10-CM | POA: Diagnosis not present

## 2021-04-23 MED ORDER — HYDROCODONE-ACETAMINOPHEN 10-325 MG PO TABS: 1.0000 | ORAL_TABLET | Freq: Three times a day (TID) | ORAL | 0 refills | Status: AC | PRN

## 2021-04-23 NOTE — Telephone Encounter (Signed)
Notified patient that medication has been sent to pharmacy.

## 2021-04-23 NOTE — Progress Notes (Signed)
Pain meds refilled 

## 2021-04-23 NOTE — Therapy (Signed)
OUTPATIENT PHYSICAL THERAPY TREATMENT NOTE   Patient Name: Krista Hogan MRN: [id number] DOB:1980-06-26, 41 y.o., female Today's Date: 04/23/2021  PCP: Minette Brine, FNP REFERRING PROVIDER: Minette Brine, FNP   PT End of Session - 04/23/21 1527     Visit Number 6    Number of Visits 17    Date for PT Re-Evaluation 05/09/21    Authorization Type [employer] Managed    PT Start Time 1530    PT Stop Time 1610    PT Time Calculation (min) 40 min    Activity Tolerance Patient tolerated treatment well    Behavior During Therapy [facility] for tasks assessed/performed              Past Medical History:  Diagnosis Date   DUB (dysfunctional uterine bleeding)    Graves disease 06/2016   History of abnormal cervical Pap smear    2011   History of HPV infection    Uterine fibroid    Wears glasses    Past Surgical History:  Procedure Laterality Date   CESAREAN SECTION N/A 01/06/2018   Procedure: CESAREAN SECTION;  Surgeon: Caren Macadam, MD;  Location: [place];  Service: Obstetrics;  Laterality: N/A;   EXCISION OF SKIN TAG  11/22/2014   Procedure: EXCISION OF SKIN TAG VULVA;  Surgeon: Governor Specking, MD;  Location: [place];  Service: Gynecology;;   LAPAROSCOPIC GELPORT ASSISTED MYOMECTOMY N/A 11/22/2014   Procedure: LAPAROSCOPIC GELPORT ASSISTED MYOMECTOMY WITH CHROMOTUBATION;  Surgeon: Governor Specking, MD;  Location: [place];  Service: Gynecology;  Laterality: N/A;   LAPAROSCOPY N/A 11/22/2014   Procedure: LAPAROSCOPY DIAGNOSTIC;  Surgeon: Governor Specking, MD;  Location: [place];  Service: Gynecology;  Laterality: N/A;   LEEP  2011   TONSILLECTOMY AND ADENOIDECTOMY  age 44   WISDOM TOOTH EXTRACTION  2006   Patient Active Problem List   Diagnosis Date Noted   Right lower quadrant pain 03/29/2020   Chronic idiopathic constipation 03/29/2020   Acute anal fissure 03/29/2020   Irritant dermatitis 03/21/2020    Itching of both hands 12/27/2019   Acne scarring 11/02/2019   Acne vulgaris 11/02/2019   Hyperpigmentation 11/02/2019   Gastroesophageal reflux disease without esophagitis 08/29/2019   Globus pharyngeus 08/29/2019   Nasal turbinate hypertrophy 08/29/2019   Rhinitis, chronic 08/29/2019   Throat irritation 08/29/2019   Palpitations 02/25/2019   Dysesthesia 02/01/2019   Groin pain 02/01/2019   Obesity 02/01/2019   Postpartum hypertension 01/10/2018   S/P repeat low transverse C-section 01/06/2018   Supervision of other normal pregnancy, antepartum 07/08/2017   Graves disease 10/01/2016   Genital herpes simplex 07/29/2016   Cyst of ovary 07/28/2016   Genital warts 07/28/2016   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   Migraine 07/28/2016   Hypothyroidism 12/17/2015   Mucosal irritation of oral cavity 12/17/2015   Otalgia, right 12/17/2015   Chronic pelvic pain in female 11/15/2015   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    REFERRING DIAG:  M77.9 (ICD-10-CM) - Capsulitis M77.8 (ICD-10-CM) - Extensor tendinitis of foot M79.671 (ICD-10-CM) - Right foot pain M25.571 (ICD-10-CM) - Sinus tarsi syndrome of right foot  THERAPY DIAG:  Pain in right ankle and joints of right foot  Muscle weakness (generalized)  Other abnormalities of gait and mobility  PERTINENT HISTORY: Hx of lateral R foot/ankle pain since August 2022  PRECAUTIONS: None  SUBJECTIVE:  Pt presents to PT with continued reports of slight decrease in R foot/ankle pain. Has been compliant  with HEP with no adverse effect. Pt is ready to begin PT at this time.     Pain: Are you having pain? Yes NPRS: 5/10 Pain Location: R anterior foot Pain Frequency/Description: intermittent  Aggravating Factors: walking, driving, standing  Relieving Factors: rest   OBJECTIVE:   Today's Treatment: [facility] Adult PT Treatment:                                                DATE: 04-23-2021 Interventions: Therapeutic  Exercise: NuStep lvl 5 UE/LE x 5 min while taking subjective Standing heel-toe raises x 10 R ankle 4-way 2x15 RTB Tandem stance R back 2x30"  Step ups 2x10 6in R leading Slant board stretch 2x30" BAPS L2 cw/ccw 2x10 each R   [facility] Adult PT Treatment:                                                DATE: 04-17-2021 Aquatic therapy at [address] - therapeutic pool temp 86 degrees. Pt enters pool area without R ankle brace and no AD but with antalgic gait and decreased stance time on R ankle.  Reviewed current function, pain levels, response to prior Rx, and HEP compliance.   Walking forward, backward and side stepping 4 widths each  Seated stretching gastroc, hamstring and adductors 3x30 sec bilat -Flutter at hip with DF/pf 3x30 -add/abd 3x20 ankles in df   Standing: Cues for abdominal bracing for core engagement; bilateral supported ue with yellow hand buoys DF and PF   2x20 -SL PF R/L 2x12 -Marching 3x20 -add/abd 2x10 -hip extension 2x10  Tandem walking forward and backward 2 widths each  Pt requires buoyancy for support and to offload joints with strengthening exercises. Viscosity of the water is needed for resistance of strengthening; water current perturbations provides challenge to standing balance unsupported, requiring increased core activation.  [facility] Adult PT Treatment:                                                DATE: 04-09-2021  Interventions: Therapeutic Exercise: NuStep lvl 5 LE only x 4 min while taking subjective Seated heel raise 2x20 10# KB on knee R calf stretch w/ towel 2x30" R ankle 4-way 2x15 RTB Tandem stance R back 2x30" (NT) Slant board stretch 2x30" (NT) Manual Therapy: STM to R foot extensor bundle Passive stretch to R foot extensors IASTM to R gastroc  Patient Education:  Education details: Intro to [employer] and therapeutic principles of water 04-03-21 Person educated: Patient Education method: Holiday representative Education comprehension: verbalized understanding and returned demonstration   Home Exercise Program: Access Code: [email or web address]   ASSESSMENT: Pt was able to complete all prescribed exercises with no adverse effect. Therapy today focused on improving R ankle stability and strength. She trailed the recumbent bike at end of session and will trial this at the gym between sessions, as it did not increase ankle pain. She was able to progress further with therapy today and continues to benefit from skilled PT. Will continue to progress as tolerated per POC.    GOALS: Goals  reviewed with patient? No   SHORT TERM GOALS:   STG Name Target Date Goal status  1 Pt will be compliant and knowledgeable with initial HEP for improved comfort and carryover Baseline: initial HEP given 04/04/2021 INITIAL  2 Pt will self report R foot/ankle pain no greater than 6/10 for improved comfort and functional ability Baseline: 10/10 R foot/ankle pain at worst 04/04/2021 INITIAL    LONG TERM GOALS:    LTG Name Target Date Goal status  1 Pt will improve FOTO function score to no less than 62% as proxy for functional improvement  Baseline: 34% function 05/09/2021 INITIAL  2 Pt will self report R foot/ankle pain no greater than 3/10 for improved comfort and functional ability  Baseline: 10/10 R foot/ankle pain at worst 05/09/2021 INITIAL  3 Pt will be able hold SLS on R LE no less than 15 seconds for improved balance and mobility Baseline: unable 05/09/2021 INITIAL  4 Pt will improve R ankle DF no less than 10 deg for improved functional mobility Baseline: 6 deg 05/09/2021 INITIAL  5 Pt will improve R ankle MMT to no less than 4/5 for all tested motions in order to improve comfort and functional ability Baseline: MMT Right 03/14/2021 Left 03/14/2021  Ankle dorsiflexion 3/5    Ankle plantarflexion 3+/5    Ankle inversion 3+/5    Ankle eversion 2+/5     05/09/2021 INITIAL    PLAN: PT FREQUENCY: 2x/week   PT  DURATION: 8 weeks   PLANNED INTERVENTIONS: Therapeutic exercises, Therapeutic activity, Neuro Muscular re-education, Balance training, Gait training, Patient/Family education, Joint mobilization, Aquatic Therapy, Dry Needling, Cryotherapy, Moist heat, Taping, Vasopneumatic device, and Manual therapy   PLAN FOR NEXT SESSION:  assess response to aquatics, assess response to HEP; trial tandem stance; progress gait and R ankle strengthening as able   Ward Chatters, PT 04/23/21 4:23 PM

## 2021-04-24 ENCOUNTER — Encounter (HOSPITAL_BASED_OUTPATIENT_CLINIC_OR_DEPARTMENT_OTHER): Payer: Self-pay | Admitting: Physical Therapy

## 2021-04-24 ENCOUNTER — Ambulatory Visit (HOSPITAL_BASED_OUTPATIENT_CLINIC_OR_DEPARTMENT_OTHER): Payer: Managed Care, Other (non HMO) | Admitting: Physical Therapy

## 2021-04-24 DIAGNOSIS — R2681 Unsteadiness on feet: Secondary | ICD-10-CM

## 2021-04-24 DIAGNOSIS — R2689 Other abnormalities of gait and mobility: Secondary | ICD-10-CM

## 2021-04-24 DIAGNOSIS — M25571 Pain in right ankle and joints of right foot: Secondary | ICD-10-CM

## 2021-04-24 DIAGNOSIS — M6281 Muscle weakness (generalized): Secondary | ICD-10-CM

## 2021-04-24 DIAGNOSIS — M79671 Pain in right foot: Secondary | ICD-10-CM | POA: Diagnosis not present

## 2021-04-24 NOTE — Therapy (Signed)
OUTPATIENT PHYSICAL THERAPY TREATMENT NOTE   Patient Name: Krista Hogan MRN: [id number] DOB:1981/02/24, 41 y.o., female Today's Date: 04/24/2021  PCP: Minette Brine, FNP REFERRING PROVIDER: Landis Martins, DPM   PT End of Session - 04/24/21 1213     Visit Number 6    Number of Visits 17    Date for PT Re-Evaluation 05/09/21    Authorization Type [employer] Managed    PT Start Time 1205    PT Stop Time 1250    PT Time Calculation (min) 45 min    Activity Tolerance Patient tolerated treatment well    Behavior During Therapy [facility] for tasks assessed/performed              Past Medical History:  Diagnosis Date   DUB (dysfunctional uterine bleeding)    Graves disease 06/2016   History of abnormal cervical Pap smear    2011   History of HPV infection    Uterine fibroid    Wears glasses    Past Surgical History:  Procedure Laterality Date   CESAREAN SECTION N/A 01/06/2018   Procedure: CESAREAN SECTION;  Surgeon: Caren Macadam, MD;  Location: [place];  Service: Obstetrics;  Laterality: N/A;   EXCISION OF SKIN TAG  11/22/2014   Procedure: EXCISION OF SKIN TAG VULVA;  Surgeon: Governor Specking, MD;  Location: [place];  Service: Gynecology;;   LAPAROSCOPIC GELPORT ASSISTED MYOMECTOMY N/A 11/22/2014   Procedure: LAPAROSCOPIC GELPORT ASSISTED MYOMECTOMY WITH CHROMOTUBATION;  Surgeon: Governor Specking, MD;  Location: [place];  Service: Gynecology;  Laterality: N/A;   LAPAROSCOPY N/A 11/22/2014   Procedure: LAPAROSCOPY DIAGNOSTIC;  Surgeon: Governor Specking, MD;  Location: [place];  Service: Gynecology;  Laterality: N/A;   LEEP  2011   TONSILLECTOMY AND ADENOIDECTOMY  age 63   WISDOM TOOTH EXTRACTION  2006   Patient Active Problem List   Diagnosis Date Noted   Right lower quadrant pain 03/29/2020   Chronic idiopathic constipation 03/29/2020   Acute anal fissure 03/29/2020   Irritant dermatitis 03/21/2020    Itching of both hands 12/27/2019   Acne scarring 11/02/2019   Acne vulgaris 11/02/2019   Hyperpigmentation 11/02/2019   Gastroesophageal reflux disease without esophagitis 08/29/2019   Globus pharyngeus 08/29/2019   Nasal turbinate hypertrophy 08/29/2019   Rhinitis, chronic 08/29/2019   Throat irritation 08/29/2019   Palpitations 02/25/2019   Dysesthesia 02/01/2019   Groin pain 02/01/2019   Obesity 02/01/2019   Postpartum hypertension 01/10/2018   S/P repeat low transverse C-section 01/06/2018   Supervision of other normal pregnancy, antepartum 07/08/2017   Graves disease 10/01/2016   Genital herpes simplex 07/29/2016   Cyst of ovary 07/28/2016   Genital warts 07/28/2016   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   Migraine 07/28/2016   Hypothyroidism 12/17/2015   Mucosal irritation of oral cavity 12/17/2015   Otalgia, right 12/17/2015   Chronic pelvic pain in female 11/15/2015   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    REFERRING DIAG:  M77.9 (ICD-10-CM) - Capsulitis M77.8 (ICD-10-CM) - Extensor tendinitis of foot M79.671 (ICD-10-CM) - Right foot pain M25.571 (ICD-10-CM) - Sinus tarsi syndrome of right foot  THERAPY DIAG:  Pain in right ankle and joints of right foot  Muscle weakness (generalized)  Other abnormalities of gait and mobility  Unsteadiness on feet  PERTINENT HISTORY: Hx of lateral R foot/ankle pain since August 2022  PRECAUTIONS: None  SUBJECTIVE:  Pt presents to PT with continued reports of slight decrease in R foot/ankle  pain. Has been compliant with HEP with no adverse effect. Pt is ready to begin PT at this time.     Pain: Are you having pain? Yes NPRS: 5/10 Pain Location: R anterior foot Pain Frequency/Description: intermittent  Aggravating Factors: walking, driving, standing  Relieving Factors: rest   OBJECTIVE:   Today's Treatment: [place] Adult PT Treatment:                                                DATE: 04-24-2021 Aquatic  therapy at [address] - therapeutic pool temp 86 degrees. Pt enters pool area without R ankle brace and no AD but with antalgic gait and decreased stance time on R ankle.  Reviewed current function, pain levels, response to prior Rx, and HEP compliance.   Walking forward, backward and side stepping 4 widths each  Tandem stance in 3.6 ft Leading R/L ue supported with hand buoys progressed to no UE support.  Holding ea > 30 seconds.  Standing: Cues for abdominal bracing for core engagement; bilateral supported ue with yellow hand buoys -DF and PF   2x20  -Inversion x10 -Toes walking and heel walking 4 widths of pool each -Tandem walking 2 widths  -step up on 8 inch step fwd leading with R/L x10. Added core/balance engagement without ue support  Seated  -Flutter at hip with DF/pf 3x30 -add/abd 3x20 ankles in df   Pt requires buoyancy for support and to offload joints with strengthening exercises. Viscosity of the water is needed for resistance of strengthening; water current perturbations provides challenge to standing balance unsupported, requiring increased core activation.    [facility] Adult PT Treatment:                                                DATE: 04-23-2021 Interventions: Therapeutic Exercise: NuStep lvl 5 UE/LE x 5 min while taking subjective Standing heel-toe raises x 10 R ankle 4-way 2x15 RTB Tandem stance R back 2x30"  Step ups 2x10 6in R leading Slant board stretch 2x30" BAPS L2 cw/ccw 2x10 each R   [facility] Adult PT Treatment:                                                DATE: 04-17-2021 Aquatic therapy at [address] - therapeutic pool temp 86 degrees. Pt enters pool area without R ankle brace and no AD but with antalgic gait and decreased stance time on R ankle.  Reviewed current function, pain levels, response to prior Rx, and HEP compliance.   Walking forward, backward and side stepping 4 widths each  Seated stretching gastroc,  hamstring and adductors 3x30 sec bilat -Flutter at hip with DF/pf 3x30 -add/abd 3x20 ankles in df   Standing: Cues for abdominal bracing for core engagement; bilateral supported ue with yellow hand buoys DF and PF   2x20 -SL PF R/L 2x12 -Marching 3x20 -add/abd 2x10 -hip extension 2x10  Tandem walking forward and backward 2 widths each  Pt requires buoyancy for support and to offload joints with strengthening exercises. Viscosity of the water is needed  for resistance of strengthening; water current perturbations provides challenge to standing balance unsupported, requiring increased core activation.  [facility] Adult PT Treatment:                                                DATE: 04-09-2021  Interventions: Therapeutic Exercise: NuStep lvl 5 LE only x 4 min while taking subjective Seated heel raise 2x20 10# KB on knee R calf stretch w/ towel 2x30" R ankle 4-way 2x15 RTB Tandem stance R back 2x30" (NT) Slant board stretch 2x30" (NT) Manual Therapy: STM to R foot extensor bundle Passive stretch to R foot extensors IASTM to R gastroc  Patient Education:  Education details: Intro to [employer] and therapeutic principles of water 04-03-21 Person educated: Patient Education method: Customer service manager Education comprehension: verbalized understanding and returned demonstration   Home Exercise Program: Access Code: [email or web address]   ASSESSMENT: Pt reports ankle is much looser and she is getting around better but pain persists.  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  She has been wearing boot to work which helps decrease the pain. She is on her feet entire time. Pt directed through exercise also completed on land (tandem stance and step ups) with improved toleration submerged although with increase in intensity today some increase in pain after session.  Gait submerged wfl.  She reports compliance with HEP.   Pain without  activity 1-2/10.  After ~ 10-15 steps walking increases to ~8-9/10.     GOALS: Goals reviewed with patient? No   SHORT TERM GOALS:   STG Name Target Date Goal status  1 Pt will be compliant and knowledgeable with initial HEP for improved comfort and carryover Baseline: initial HEP given 04/04/2021 04/24/21 INITIAL Achieved  2 Pt will self report R foot/ankle pain no greater than 6/10 for improved comfort and functional ability Baseline: 10/10 R foot/ankle pain at worst 04/04/2021 INITIAL    LONG TERM GOALS:    LTG Name Target Date Goal status  1 Pt will improve FOTO function score to no less than 62% as proxy for functional improvement  Baseline: 34% function 05/09/2021 INITIAL  2 Pt will self report R foot/ankle pain no greater than 3/10 for improved comfort and functional ability  Baseline: 10/10 R foot/ankle pain at worst 05/09/2021 INITIAL  3 Pt will be able hold SLS on R LE no less than 15 seconds for improved balance and mobility Baseline: unable 05/09/2021 INITIAL  4 Pt will improve R ankle DF no less than 10 deg for improved functional mobility Baseline: 6 deg 05/09/2021 INITIAL  5 Pt will improve R ankle MMT to no less than 4/5 for all tested motions in order to improve comfort and functional ability Baseline: MMT Right 03/14/2021 Left 03/14/2021  Ankle dorsiflexion 3/5    Ankle plantarflexion 3+/5    Ankle inversion 3+/5    Ankle eversion 2+/5     05/09/2021 INITIAL    PLAN: PT FREQUENCY: 2x/week   PT DURATION: 8 weeks   PLANNED INTERVENTIONS: Therapeutic exercises, Therapeutic activity, Neuro Muscular re-education, Balance training, Gait training, Patient/Family education, Joint mobilization, Aquatic Therapy, Dry Needling, Cryotherapy, Moist heat, Taping, Vasopneumatic device, and Manual therapy   PLAN FOR NEXT SESSION:  trial tandem stance; progress gait and R ankle strengthening as  able   Stanton Kidney Tharon Aquas) Elmus Mathes MPT 04/24/21 12:59 PM

## 2021-04-26 ENCOUNTER — Other Ambulatory Visit: Payer: Self-pay | Admitting: Podiatry

## 2021-04-30 ENCOUNTER — Other Ambulatory Visit: Payer: Self-pay

## 2021-04-30 ENCOUNTER — Ambulatory Visit: Payer: Managed Care, Other (non HMO)

## 2021-04-30 DIAGNOSIS — M25571 Pain in right ankle and joints of right foot: Secondary | ICD-10-CM | POA: Diagnosis not present

## 2021-04-30 DIAGNOSIS — M6281 Muscle weakness (generalized): Secondary | ICD-10-CM

## 2021-04-30 NOTE — Therapy (Signed)
OUTPATIENT PHYSICAL THERAPY TREATMENT NOTE/PROGRESS NOTE  Progress Note Reporting Period 03/14/2021 to 04/30/2021  See note below for Objective Data and Assessment of Progress/Goals.      Patient Name: Krista Hogan MRN: [id number] DOB:[birth year], 41 y.o., female Today's Date: 04/30/2021  PCP: Minette Brine, FNP REFERRING PROVIDER: Minette Brine, FNP   PT End of Session - 04/30/21 1530     Visit Number 7    Number of Visits 17    Date for PT Re-Evaluation 06/11/21    Authorization Type [employer] Managed    PT Start Time 1530    PT Stop Time 1610    PT Time Calculation (min) 40 min    Activity Tolerance Patient tolerated treatment well    Behavior During Therapy [facility] for tasks assessed/performed               Past Medical History:  Diagnosis Date   DUB (dysfunctional uterine bleeding)    Graves disease 06/2016   History of abnormal cervical Pap smear    2011   History of HPV infection    Uterine fibroid    Wears glasses    Past Surgical History:  Procedure Laterality Date   CESAREAN SECTION N/A 01/06/2018   Procedure: CESAREAN SECTION;  Surgeon: Caren Macadam, MD;  Location: [place];  Service: Obstetrics;  Laterality: N/A;   EXCISION OF SKIN TAG  11/22/2014   Procedure: EXCISION OF SKIN TAG VULVA;  Surgeon: Governor Specking, MD;  Location: [place];  Service: Gynecology;;   LAPAROSCOPIC GELPORT ASSISTED MYOMECTOMY N/A 11/22/2014   Procedure: LAPAROSCOPIC GELPORT ASSISTED MYOMECTOMY WITH CHROMOTUBATION;  Surgeon: Governor Specking, MD;  Location: [place];  Service: Gynecology;  Laterality: N/A;   LAPAROSCOPY N/A 11/22/2014   Procedure: LAPAROSCOPY DIAGNOSTIC;  Surgeon: Governor Specking, MD;  Location: [place];  Service: Gynecology;  Laterality: N/A;   LEEP  2011   TONSILLECTOMY AND ADENOIDECTOMY  age 45   WISDOM TOOTH EXTRACTION  2006   Patient Active Problem List   Diagnosis Date Noted    Right lower quadrant pain 03/29/2020   Chronic idiopathic constipation 03/29/2020   Acute anal fissure 03/29/2020   Irritant dermatitis 03/21/2020   Itching of both hands 12/27/2019   Acne scarring 11/02/2019   Acne vulgaris 11/02/2019   Hyperpigmentation 11/02/2019   Gastroesophageal reflux disease without esophagitis 08/29/2019   Globus pharyngeus 08/29/2019   Nasal turbinate hypertrophy 08/29/2019   Rhinitis, chronic 08/29/2019   Throat irritation 08/29/2019   Palpitations 02/25/2019   Dysesthesia 02/01/2019   Groin pain 02/01/2019   Obesity 02/01/2019   Postpartum hypertension 01/10/2018   S/P repeat low transverse C-section 01/06/2018   Supervision of other normal pregnancy, antepartum 07/08/2017   Graves disease 10/01/2016   Genital herpes simplex 07/29/2016   Cyst of ovary 07/28/2016   Genital warts 07/28/2016   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   Migraine 07/28/2016   Hypothyroidism 12/17/2015   Mucosal irritation of oral cavity 12/17/2015   Otalgia, right 12/17/2015   Chronic pelvic pain in female 11/15/2015   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    REFERRING DIAG:  M77.9 (ICD-10-CM) - Capsulitis M77.8 (ICD-10-CM) - Extensor tendinitis of foot M79.671 (ICD-10-CM) - Right foot pain M25.571 (ICD-10-CM) - Sinus tarsi syndrome of right foot  THERAPY DIAG:  Pain in right ankle and joints of right foot - Plan: PT plan of care cert/re-cert  Muscle weakness (generalized) - Plan: PT plan of care cert/re-cert  PERTINENT HISTORY: Hx  of lateral R foot/ankle pain since August 2022  PRECAUTIONS: None  SUBJECTIVE:  Pt presents to PT with reports of increased R foot pain since last night. She has remained compliant with HEP with no adverse effect. Pt is ready to begin PT at this time.   Pain: Are you having pain? Yes NPRS: 8/10 Pain Location: R anterior foot Pain Frequency/Description: intermittent  Aggravating Factors: walking, driving, standing   Relieving Factors: rest   OBJECTIVE:  Strength: MMT Right 03/14/2021 Right 04/30/2021  Ankle dorsiflexion 3/5  3+/5  Ankle plantarflexion 3+/5  4/5  Ankle inversion 3+/5  3+/5  Ankle eversion 2+/5  2+/5   Outcomes:  FOTO: 50% function  Interventions: Today's Treatment: [place] Adult PT Treatment:                                                DATE: 04-30-2021 Interventions: Therapeutic Exercise: STS 2x10 - no UE support Standing heel-toe raises 2x10 R ankle 4-way 2x15 RTB Step ups fwd 2x10 6in R leading Standing hip abd/ext 2x10 RTB each Slant board stretch 2x45" BAPS L2 cw/ccw 2x10 each R  Neuromuscular Re-Ed: Tandem stance R back 2x30"  FT on foam 2x30"  [facility] Adult PT Treatment:                                                DATE: 04-24-2021 Aquatic therapy at [address] - therapeutic pool temp 86 degrees. Pt enters pool area without R ankle brace and no AD but with antalgic gait and decreased stance time on R ankle.  Reviewed current function, pain levels, response to prior Rx, and HEP compliance.   Walking forward, backward and side stepping 4 widths each  Tandem stance in 3.6 ft Leading R/L ue supported with hand buoys progressed to no UE support.  Holding ea > 30 seconds.  Standing: Cues for abdominal bracing for core engagement; bilateral supported ue with yellow hand buoys -DF and PF   2x20  -Inversion x10 -Toes walking and heel walking 4 widths of pool each -Tandem walking 2 widths  -step up on 8 inch step fwd leading with R/L x10. Added core/balance engagement without ue support  Seated  -Flutter at hip with DF/pf 3x30 -add/abd 3x20 ankles in df   Pt requires buoyancy for support and to offload joints with strengthening exercises. Viscosity of the water is needed for resistance of strengthening; water current perturbations provides challenge to standing balance unsupported, requiring increased core activation.    [facility] Adult PT Treatment:                                                 DATE: 04-23-2021 Interventions: Therapeutic Exercise: NuStep lvl 5 UE/LE x 5 min while taking subjective Standing heel-toe raises x 10 R ankle 4-way 2x15 RTB Tandem stance R back 2x30"  Step ups 2x10 6in R leading Slant board stretch 2x30" BAPS L2 cw/ccw 2x10 each R   Patient Education:  Education details: Intro to [employer] and therapeutic principles of water 04-03-21 Person educated: Patient Education method: Dance movement psychotherapist  comprehension: verbalized understanding and returned demonstration   Home Exercise Program: Access Code: [email or web address]   ASSESSMENT: Pt was able to complete prescribed exercises with no adverse effect. Therapy today focused on improving R ankle stability and LE muscle strength. She has progressed fairly well with therapy, showing some improvement in R ankle strength and improvement in subjective functional ability with increased FOTO score. She continues to benefit from skilled PT services and will continue to be seen and progressed as tolerated.   Problem List: decreased balance, decreased endurance, decreased mobility, difficulty walking, decreased ROM, decreased strength, and pain   GOALS: Goals reviewed with patient? No   SHORT TERM GOALS:   STG Name Target Date Goal status  1 Pt will be compliant and knowledgeable with initial HEP for improved comfort and carryover Baseline: initial HEP given 04/04/2021 04/24/21 Achieved  2 Pt will self report R foot/ankle pain no greater than 6/10 for improved comfort and functional ability Baseline: 10/10 R foot/ankle pain at worst 04/04/2021 ONGOING    LONG TERM GOALS:    LTG Name Target Date Goal status  1 Pt will improve FOTO function score to no less than 62% as proxy for functional improvement  Baseline: 34% function 04/30/2021: 50% FUNCTION 06/11/2021 ONGOING  2 Pt will self report R foot/ankle pain no greater than 3/10 for improved comfort and  functional ability  Baseline: 10/10 R foot/ankle pain at worst 06/11/2021 ONGOING  3 Pt will be able hold SLS on R LE no less than 15 seconds for improved balance and mobility Baseline: unable 06/11/2021 ONGOING  4 Pt will improve R ankle DF no less than 10 deg for improved functional mobility Baseline: 6 deg 06/11/2021 ONGOING  5 Pt will improve R ankle MMT to no less than 4/5 for all tested motions in order to improve comfort and functional ability Baseline: see chart 06/11/2021 ONGOING    PLAN: PT FREQUENCY: 2x/week   PT DURATION: 6 weeks   PLANNED INTERVENTIONS: Therapeutic exercises, Therapeutic activity, Neuro Muscular re-education, Balance training, Gait training, Patient/Family education, Joint mobilization, Aquatic Therapy, Dry Needling, Cryotherapy, Moist heat, Taping, Vasopneumatic device, and Manual therapy   PLAN FOR NEXT SESSION:  trial tandem stance; progress gait and R ankle strengthening as able   Ward Chatters, PT 04/30/21 5:19 PM

## 2021-05-01 ENCOUNTER — Ambulatory Visit (HOSPITAL_BASED_OUTPATIENT_CLINIC_OR_DEPARTMENT_OTHER): Payer: Managed Care, Other (non HMO) | Admitting: Physical Therapy

## 2021-05-07 ENCOUNTER — Ambulatory Visit: Payer: Managed Care, Other (non HMO)

## 2021-05-07 NOTE — Therapy (Incomplete)
OUTPATIENT PHYSICAL THERAPY TREATMENT NOTE    Patient Name: Krista Hogan MRN: [id number] DOB:31-Jul-1980, 41 y.o., female Today's Date: 05/07/2021  PCP: Minette Brine, FNP REFERRING PROVIDER: Minette Brine, FNP       Past Medical History:  Diagnosis Date   DUB (dysfunctional uterine bleeding)    Graves disease 06/2016   History of abnormal cervical Pap smear    2011   History of HPV infection    Uterine fibroid    Wears glasses    Past Surgical History:  Procedure Laterality Date   CESAREAN SECTION N/A 01/06/2018   Procedure: CESAREAN SECTION;  Surgeon: Caren Macadam, MD;  Location: [place];  Service: Obstetrics;  Laterality: N/A;   EXCISION OF SKIN TAG  11/22/2014   Procedure: EXCISION OF SKIN TAG VULVA;  Surgeon: Governor Specking, MD;  Location: [place];  Service: Gynecology;;   LAPAROSCOPIC GELPORT ASSISTED MYOMECTOMY N/A 11/22/2014   Procedure: LAPAROSCOPIC GELPORT ASSISTED MYOMECTOMY WITH CHROMOTUBATION;  Surgeon: Governor Specking, MD;  Location: [place];  Service: Gynecology;  Laterality: N/A;   LAPAROSCOPY N/A 11/22/2014   Procedure: LAPAROSCOPY DIAGNOSTIC;  Surgeon: Governor Specking, MD;  Location: [place];  Service: Gynecology;  Laterality: N/A;   LEEP  2011   TONSILLECTOMY AND ADENOIDECTOMY  age 51   WISDOM TOOTH EXTRACTION  2006   Patient Active Problem List   Diagnosis Date Noted   Right lower quadrant pain 03/29/2020   Chronic idiopathic constipation 03/29/2020   Acute anal fissure 03/29/2020   Irritant dermatitis 03/21/2020   Itching of both hands 12/27/2019   Acne scarring 11/02/2019   Acne vulgaris 11/02/2019   Hyperpigmentation 11/02/2019   Gastroesophageal reflux disease without esophagitis 08/29/2019   Globus pharyngeus 08/29/2019   Nasal turbinate hypertrophy 08/29/2019   Rhinitis, chronic 08/29/2019   Throat irritation 08/29/2019   Palpitations 02/25/2019    Dysesthesia 02/01/2019   Groin pain 02/01/2019   Obesity 02/01/2019   Postpartum hypertension 01/10/2018   S/P repeat low transverse C-section 01/06/2018   Supervision of other normal pregnancy, antepartum 07/08/2017   Graves disease 10/01/2016   Genital herpes simplex 07/29/2016   Cyst of ovary 07/28/2016   Genital warts 07/28/2016   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  PERTINENT HISTORY: Hx of lateral R foot/ankle pain since August 2022  PRECAUTIONS: None  SUBJECTIVE:  ***  Pain: Are you having pain? Yes NPRS: 8/10 Pain Location: R anterior foot Pain Frequency/Description: intermittent  Aggravating Factors: walking, driving, standing  Relieving Factors: rest   OBJECTIVE:  Strength: MMT Right 03/14/2021 Right 04/30/2021  Ankle dorsiflexion 3/5  3+/5  Ankle plantarflexion 3+/5  4/5  Ankle inversion 3+/5  3+/5  Ankle eversion 2+/5  2+/5    Interventions:  [facility] Adult PT Treatment:                                                DATE: 05/07/2021  Interventions: Therapeutic Exercise: STS 2x10 - no UE support Standing heel-toe raises 2x10 R ankle 4-way 2x15 RTB Step ups fwd 2x10 6in R leading Standing hip abd/ext 2x10 RTB each Slant board stretch 2x45" BAPS L2 cw/ccw 2x10 each R  Neuromuscular Re-Ed: Tandem stance R back 2x30"  FT on foam 2x30"  [facility] Adult PT Treatment:                                                DATE: 04/30/2021 Interventions: Therapeutic Exercise: STS 2x10 - no UE support Standing heel-toe  raises 2x10 R ankle 4-way 2x15 RTB Step ups fwd 2x10 6in R leading Standing hip abd/ext 2x10 RTB each Slant board stretch 2x45" BAPS L2 cw/ccw 2x10 each R  Neuromuscular Re-Ed: Tandem stance R back 2x30"  FT on foam 2x30"  Patient Education:  Education details: Intro to [employer] and therapeutic principles of water 04-03-21 Person educated: Patient Education method: Customer service manager Education comprehension: verbalized understanding and returned demonstration   Home Exercise Program: Access Code: [email or web address]   ASSESSMENT: ***  Problem List: decreased balance, decreased endurance, decreased mobility, difficulty walking, decreased ROM, decreased strength, and pain   GOALS: Goals reviewed with patient? No   SHORT TERM GOALS:   STG Name Target Date Goal status  1 Pt will be compliant and knowledgeable with initial HEP for improved comfort and carryover Baseline: initial HEP given 04/04/2021 04/24/21 Achieved  2 Pt will self report R foot/ankle pain no greater than 6/10 for improved comfort and functional ability Baseline: 10/10 R foot/ankle pain at worst 04/04/2021 ONGOING    LONG TERM GOALS:    LTG Name Target Date Goal status  1 Pt will improve FOTO function score to no less than 62% as proxy for functional improvement  Baseline: 34% function 04/30/2021: 50% FUNCTION 06/11/2021 ONGOING  2 Pt will self report R foot/ankle pain no greater than 3/10 for improved comfort and functional ability  Baseline: 10/10 R foot/ankle pain at worst 06/11/2021 ONGOING  3 Pt will be able hold SLS on R LE no less than 15 seconds for improved balance and mobility Baseline: unable 06/11/2021 ONGOING  4 Pt will improve R ankle DF no less than 10 deg for improved functional mobility Baseline: 6 deg 06/11/2021 ONGOING  5 Pt will improve R ankle MMT to no less than 4/5 for all tested motions in order to improve comfort and functional ability Baseline: see chart 06/11/2021 ONGOING    PLAN: PT  FREQUENCY: 2x/week   PT DURATION: 6 weeks   PLANNED INTERVENTIONS: Therapeutic exercises, Therapeutic activity, Neuro Muscular re-education, Balance training, Gait training, Patient/Family education, Joint mobilization, Aquatic Therapy, Dry Needling, Cryotherapy, Moist heat, Taping, Vasopneumatic device, and Manual therapy   PLAN FOR NEXT SESSION:  trial tandem stance; progress gait and R ankle strengthening as able   Ward Chatters, PT 05/07/21 10:22 AM

## 2021-05-09 ENCOUNTER — Ambulatory Visit: Payer: Managed Care, Other (non HMO) | Admitting: Sports Medicine

## 2021-05-09 ENCOUNTER — Other Ambulatory Visit: Payer: Self-pay

## 2021-05-09 DIAGNOSIS — M792 Neuralgia and neuritis, unspecified: Secondary | ICD-10-CM

## 2021-05-09 DIAGNOSIS — M79671 Pain in right foot: Secondary | ICD-10-CM | POA: Diagnosis not present

## 2021-05-09 DIAGNOSIS — G8929 Other chronic pain: Secondary | ICD-10-CM

## 2021-05-09 DIAGNOSIS — M779 Enthesopathy, unspecified: Secondary | ICD-10-CM | POA: Diagnosis not present

## 2021-05-09 MED ORDER — HYDROCODONE-ACETAMINOPHEN 10-325 MG PO TABS: 1.0000 | ORAL_TABLET | Freq: Four times a day (QID) | ORAL | 0 refills | Status: AC | PRN

## 2021-05-09 NOTE — Progress Notes (Signed)
Subjective: ?Krista Hogan is a 41 y.o. female patient who presents to office for evaluation of right foot pain.  Patient reports that she still has a lot of pain and minor swelling states pain is 10 out of 10 after work other than that on average 7 out of 10 states that the boot helps but she feels like it is rubbing the front of her shin and states that she would like a different boot or something a little shorter and a refill on her pain medicine states that when she is in the water doing aquatic therapy she does not have any pain but anytime she is outside of the pool walking then the pain intensifies at the right foot and ankle states that most of the pain is at the lateral ankle and states that the gabapentin has helped any sharp shooting pains that was going up her leg.  Patient denies any other pedal complaints at this time. ? ?Patient Active Problem List  ? Diagnosis Date Noted  ? Right lower quadrant pain 03/29/2020  ? Chronic idiopathic constipation 03/29/2020  ? Acute anal fissure 03/29/2020  ? Irritant dermatitis 03/21/2020  ? Itching of both hands 12/27/2019  ? Acne scarring 11/02/2019  ? Acne vulgaris 11/02/2019  ? Hyperpigmentation 11/02/2019  ? Gastroesophageal reflux disease without esophagitis 08/29/2019  ? Globus pharyngeus 08/29/2019  ? Nasal turbinate hypertrophy 08/29/2019  ? Rhinitis, chronic 08/29/2019  ? Throat irritation 08/29/2019  ? Palpitations 02/25/2019  ? Dysesthesia 02/01/2019  ? Groin pain 02/01/2019  ? Obesity 02/01/2019  ? Postpartum hypertension 01/10/2018  ? S/P repeat low transverse C-section 01/06/2018  ? Supervision of other normal pregnancy, antepartum 07/08/2017  ? Graves disease 10/01/2016  ? Genital herpes simplex 07/29/2016  ? Cyst of ovary 07/28/2016  ? Genital warts 07/28/2016  ? 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 Migraine 07/28/2016  ? Hypothyroidism 12/17/2015  ? Mucosal irritation of oral cavity 12/17/2015  ? Otalgia, right 12/17/2015  ? Chronic  pelvic pain in female 11/15/2015  ? 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  ? ? ?Current Outpatient Medications on File Prior to Visit  ?Medication Sig Dispense Refill  ? fluocinonide ointment (LIDEX) 0.05 % Apply to affected area on neck twice daily x 2 wk, then daily x 2 wk, then taper to tacrolimus    ? acetaminophen (TYLENOL) 500 MG tablet Take 500 mg by mouth every 6 (six) hours as needed for moderate pain. (Patient not taking: Reported on 08/02/2020)    ? cetirizine (ZYRTEC) 10 MG tablet Take 1 tablet (10 mg total) by mouth daily. 30 tablet 11  ? cholecalciferol (VITAMIN D) 1000 units tablet Take 1,000 Units by mouth daily.    ? clindamycin-benzoyl peroxide (BENZACLIN) gel Apply 1 application topically every morning.    ? cyanocobalamin 1000 MCG tablet Take 1,000 mcg by mouth daily.    ? cyclobenzaprine (FLEXERIL) 10 MG tablet Take 10 mg by mouth every 8 (eight) hours as needed.    ? gabapentin (NEURONTIN) 100 MG capsule Take 1 capsule (100 mg total) by mouth at bedtime. 90 capsule 3  ? hydroquinone 4 % cream Apply to face nightly to every other night with tretinoin    ? ibuprofen (ADVIL) 800 MG tablet TAKE 1 TABLET BY MOUTH EVERY 8 HOURS AS NEEDED 30 tablet 5  ? neomycin-polymyxin-hydrocortisone (CORTISPORIN) OTIC solution Apply 1-2 drops to toenail after soaking and then cover with bandaid 10 mL 0  ? norethindrone (MICRONOR) 0.35 MG tablet Take 1 tablet by  mouth daily.    ? norethindrone-ethinyl estradiol 1/35 (NORTREL 1/35, 28,) tablet Take 1 tablet by mouth daily. 28 tablet 11  ? Omega-3 Fatty Acids (FISH OIL) 1000 MG CAPS Take 1,000 mg by mouth daily.     ? PARoxetine (PAXIL) 20 MG tablet Take 1 tablet (20 mg total) by mouth daily. 30 tablet 11  ? predniSONE (STERAPRED UNI-PAK 21 TAB) 10 MG (21) TBPK tablet Take as directed 21 tablet 0  ? sertraline (ZOLOFT) 50 MG tablet Take 50 mg by mouth daily.    ? SPIRONOLACTONE PO Take by mouth daily.    ? tacrolimus (PROTOPIC) 0.1 % ointment Apply to neck daily to  twice daily as needed.    ? traMADol (ULTRAM) 50 MG tablet TAKE 1 TABLET BY MOUTH EVERY 8 HOURS AS NEEDED FOR  UP  TO  5  DAYS (Patient not taking: Reported on 08/02/2020) 15 tablet 0  ? tretinoin (RETIN-A) 0.025 % cream Apply to face nightly to tolerance    ? triamcinolone cream (KENALOG) 0.5 % Apply topically. (Patient not taking: Reported on 08/02/2020)    ? ?Current Facility-Administered Medications on File Prior to Visit  ?Medication Dose Route Frequency Provider Last Rate Last Admin  ? triamcinolone acetonide (KENALOG) 10 MG/ML injection 10 mg  10 mg Other Once Landis Martins, DPM      ? ? ?Allergies  ?Allergen Reactions  ? Minocycline Rash  ? ? ?Objective:  ?General: Alert and oriented x3 in no acute distress ? ?Dermatology: No open lesions bilateral lower extremities, no webspace macerations, no ecchymosis bilateral, all nails x 10 are well manicured. ? ?Vascular: Dorsalis Pedis and Posterior Tibial pedal pulses palpable, Capillary Fill Time 3 seconds,(+) pedal hair growth bilateral, minimal localized edema to the right lateral foot and ankle, temperature gradient within normal limits. ? ?Neurology: Gross sensation intact via light touch bilateral.  Resolved sharp shooting pain over the right lateral foot and ankle and lower leg with gabapentin. ? ?Musculoskeletal: Mild to moderate tenderness over the dorsal lateral fourth and fifth metatarsal bases extending to the lateral ankle and to the sinus tarsi area of the right foot with worse pain today at the sinus tarsi.  There is localized swelling without warmth to this area mild guarding due to pain unchanged from prior. ? ? ?Assessment and Plan: ?Problem List Items Addressed This Visit   ?None ?Visit Diagnoses   ? ? Tendonitis    -  Primary  ? Neuritis      ? Chronic foot pain, right      ? Relevant Medications  ? cyclobenzaprine (FLEXERIL) 10 MG tablet  ? sertraline (ZOLOFT) 50 MG tablet  ? HYDROcodone-acetaminophen (NORCO) 10-325 MG tablet  ? Right foot pain       ? ?  ?  ? ?-Complete examination performed ?-Re- discussed treatment options for chronic ongoing right foot and ankle pain ?-Advised patient to continue with aquatic physical therapy since it is helpful ?-Continue with gabapentin at bedtime to help with sharp shooting pains ?-For this time only refill hydrocodone for any pain not relieved by her gabapentin ?-Dispensed at no charge to patient a new cam boot to replace her current boot to see if this would give her more offloading and support than her previous worn-out boot ?-Patient to return in 4 to 6 weeks advised patient if pain is the same we will attempt to reorder a new MRI since over the last 3 months patient has still failed to improve with conservative care. ?  ?

## 2021-05-10 ENCOUNTER — Other Ambulatory Visit: Payer: Self-pay | Admitting: Sports Medicine

## 2021-05-10 ENCOUNTER — Encounter: Payer: Self-pay | Admitting: Internal Medicine

## 2021-05-10 ENCOUNTER — Other Ambulatory Visit: Payer: Self-pay | Admitting: Internal Medicine

## 2021-05-10 ENCOUNTER — Encounter: Payer: Self-pay | Admitting: Sports Medicine

## 2021-05-10 DIAGNOSIS — E0500 Thyrotoxicosis with diffuse goiter without thyrotoxic crisis or storm: Secondary | ICD-10-CM

## 2021-05-14 NOTE — Telephone Encounter (Signed)
Patient notified, has received.

## 2021-05-16 ENCOUNTER — Ambulatory Visit (HOSPITAL_BASED_OUTPATIENT_CLINIC_OR_DEPARTMENT_OTHER): Payer: Managed Care, Other (non HMO) | Attending: Sports Medicine | Admitting: Physical Therapy

## 2021-05-16 ENCOUNTER — Encounter (HOSPITAL_BASED_OUTPATIENT_CLINIC_OR_DEPARTMENT_OTHER): Payer: Self-pay | Admitting: Physical Therapy

## 2021-05-16 ENCOUNTER — Other Ambulatory Visit: Payer: Self-pay

## 2021-05-16 DIAGNOSIS — M25571 Pain in right ankle and joints of right foot: Secondary | ICD-10-CM | POA: Insufficient documentation

## 2021-05-16 DIAGNOSIS — R2689 Other abnormalities of gait and mobility: Secondary | ICD-10-CM | POA: Insufficient documentation

## 2021-05-16 DIAGNOSIS — M6281 Muscle weakness (generalized): Secondary | ICD-10-CM | POA: Insufficient documentation

## 2021-05-16 NOTE — Therapy (Signed)
OUTPATIENT PHYSICAL THERAPY TREATMENT NOTE/PROGRESS NOTE       Patient Name: Krista Hogan MRN: [id number] DOB:1980/12/12, 41 y.o., female Today's Date: 05/16/2021  PCP: Minette Brine, FNP REFERRING PROVIDER: Landis Martins, DPM   PT End of Session - 05/16/21 1206     Visit Number 8    Number of Visits 17    Date for PT Re-Evaluation 06/11/21    Authorization Type [employer] Managed    PT Start Time 1203    PT Stop Time 1245    PT Time Calculation (min) 42 min    Activity Tolerance Patient tolerated treatment well;Patient limited by pain    Behavior During Therapy [facility] for tasks assessed/performed               Past Medical History:  Diagnosis Date   DUB (dysfunctional uterine bleeding)    Graves disease 06/2016   History of abnormal cervical Pap smear    2011   History of HPV infection    Uterine fibroid    Wears glasses    Past Surgical History:  Procedure Laterality Date   CESAREAN SECTION N/A 01/06/2018   Procedure: CESAREAN SECTION;  Surgeon: Caren Macadam, MD;  Location: [place];  Service: Obstetrics;  Laterality: N/A;   EXCISION OF SKIN TAG  11/22/2014   Procedure: EXCISION OF SKIN TAG VULVA;  Surgeon: Governor Specking, MD;  Location: [place];  Service: Gynecology;;   LAPAROSCOPIC GELPORT ASSISTED MYOMECTOMY N/A 11/22/2014   Procedure: LAPAROSCOPIC GELPORT ASSISTED MYOMECTOMY WITH CHROMOTUBATION;  Surgeon: Governor Specking, MD;  Location: [place];  Service: Gynecology;  Laterality: N/A;   LAPAROSCOPY N/A 11/22/2014   Procedure: LAPAROSCOPY DIAGNOSTIC;  Surgeon: Governor Specking, MD;  Location: [place];  Service: Gynecology;  Laterality: N/A;   LEEP  2011   TONSILLECTOMY AND ADENOIDECTOMY  age 7   WISDOM TOOTH EXTRACTION  2006   Patient Active Problem List   Diagnosis Date Noted   Right lower quadrant pain 03/29/2020   Chronic idiopathic constipation 03/29/2020   Acute anal fissure  03/29/2020   Irritant dermatitis 03/21/2020   Itching of both hands 12/27/2019   Acne scarring 11/02/2019   Acne vulgaris 11/02/2019   Hyperpigmentation 11/02/2019   Gastroesophageal reflux disease without esophagitis 08/29/2019   Globus pharyngeus 08/29/2019   Nasal turbinate hypertrophy 08/29/2019   Rhinitis, chronic 08/29/2019   Throat irritation 08/29/2019   Palpitations 02/25/2019   Dysesthesia 02/01/2019   Groin pain 02/01/2019   Obesity 02/01/2019   Postpartum hypertension 01/10/2018   S/P repeat low transverse C-section 01/06/2018   Supervision of other normal pregnancy, antepartum 07/08/2017   Graves disease 10/01/2016   Genital herpes simplex 07/29/2016   Cyst of ovary 07/28/2016   Genital warts 07/28/2016   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   Migraine 07/28/2016   Hypothyroidism 12/17/2015   Mucosal irritation of oral cavity 12/17/2015   Otalgia, right 12/17/2015   Chronic pelvic pain in female 11/15/2015   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    REFERRING DIAG:  M77.9 (ICD-10-CM) - Capsulitis M77.8 (ICD-10-CM) - Extensor tendinitis of foot M79.671 (ICD-10-CM) - Right foot pain M25.571 (ICD-10-CM) - Sinus tarsi syndrome of right foot  THERAPY DIAG:  Pain in right ankle and joints of right foot  Muscle weakness (generalized)  Other abnormalities of gait and mobility  PERTINENT HISTORY: Hx of lateral R foot/ankle pain since August 2022  PRECAUTIONS: None  SUBJECTIVE:  Pt reports getting new camboot from MD, wearing  it and having increased pain since  Pain: Are you having pain? Yes NPRS: 8/10 Pain Location: R anterior foot Pain Frequency/Description: intermittent  Aggravating Factors: walking, driving, standing  Relieving Factors: rest   OBJECTIVE:  Strength: MMT Right 03/14/2021 Right 04/30/2021  Ankle dorsiflexion 3/5  3+/5  Ankle plantarflexion 3+/5  4/5  Ankle inversion 3+/5  3+/5  Ankle eversion 2+/5  2+/5   Outcomes:   FOTO: 50% function  Interventions: Today's Treatment: [facility] Adult PT Treatment:                                                DATE: 04-24-2021 Aquatic therapy at [address] - therapeutic pool temp 86 degrees. Pt enters pool area without R ankle brace and no AD but with antalgic gait and decreased stance time on R ankle.  Reviewed current function, pain levels, response to prior Rx, and HEP compliance.   Walking forward, backward and side stepping 4 widths each  Tandem stance in 3.6 ft Leading R/L ue supported with hand buoys with difficulty, moved to holding wall where she was able to gain position then without UE support  Holding ea ~ 20 seconds.  Standing: Cues for abdominal bracing for core engagement; bilateral supported ue with yellow hand buoys -DF and PF   2x20   Seated  -Flutter at hip with DF/pf 3x30 -add/abd 3x20 ankles in df   Manual: massage medial and inferior aspect of right lat malleolus  -Passive ROM right ankle inv/eversion, sup/pronation  Pt requires buoyancy for support and to offload joints with strengthening exercises. Viscosity of the water is needed for resistance of strengthening; water current perturbations provides challenge to standing balance unsupported, requiring increased core activation.     [facility] Adult PT Treatment:                                                DATE: 04-30-2021 Interventions: Therapeutic Exercise: STS 2x10 - no UE support Standing heel-toe raises 2x10 R ankle 4-way 2x15 RTB Step ups fwd 2x10 6in R leading Standing hip abd/ext 2x10 RTB each Slant board stretch 2x45" BAPS L2 cw/ccw 2x10 each R  Neuromuscular Re-Ed: Tandem stance R back 2x30"  FT on foam 2x30"   Patient Education:  Education details: Intro to [employer] and therapeutic principles of water 04-03-21 Person educated: Patient Education method: Customer service manager Education comprehension: verbalized understanding and returned  demonstration   Home Exercise Program: Access Code: [email or web address]   ASSESSMENT: Pt limited by pain today..Reports new boot used she received from podiatrist  which has increased her pain. Increased difficulty gaining tandem stance. Pt with moderate sensitivity to pressure lat malleolus. PT suggests trying and compression stocking to assist with circulation as well as support for working this weekend.  Pt may need to rest ankle to improve sx.  She tends to feel better after a few days rest , returns to work for week end then it flares again.  Pt was able to complete prescribed exercises with no adverse effect. Therapy today focused on improving R ankle stability and LE muscle strength. She has progressed fairly well with therapy, showing some improvement in R ankle strength and improvement in subjective functional ability with increased  FOTO score. She continues to benefit from skilled PT services and will continue to be seen and progressed as tolerated.   Problem List: decreased balance, decreased endurance, decreased mobility, difficulty walking, decreased ROM, decreased strength, and pain   GOALS: Goals reviewed with patient? No   SHORT TERM GOALS:   STG Name Target Date Goal status  1 Pt will be compliant and knowledgeable with initial HEP for improved comfort and carryover Baseline: initial HEP given 04/04/2021 04/24/21 Achieved  2 Pt will self report R foot/ankle pain no greater than 6/10 for improved comfort and functional ability Baseline: 10/10 R foot/ankle pain at worst 04/04/2021 ONGOING    LONG TERM GOALS:    LTG Name Target Date Goal status  1 Pt will improve FOTO function score to no less than 62% as proxy for functional improvement  Baseline: 34% function 04/30/2021: 50% FUNCTION 06/11/2021 ONGOING  2 Pt will self report R foot/ankle pain no greater than 3/10 for improved comfort and functional ability  Baseline: 10/10 R foot/ankle pain at worst 06/11/2021 ONGOING  3 Pt will be  able hold SLS on R LE no less than 15 seconds for improved balance and mobility Baseline: unable 06/11/2021 ONGOING  4 Pt will improve R ankle DF no less than 10 deg for improved functional mobility Baseline: 6 deg 06/11/2021 ONGOING  5 Pt will improve R ankle MMT to no less than 4/5 for all tested motions in order to improve comfort and functional ability Baseline: see chart 06/11/2021 ONGOING    PLAN: PT FREQUENCY: 2x/week   PT DURATION: 6 weeks   PLANNED INTERVENTIONS: Therapeutic exercises, Therapeutic activity, Neuro Muscular re-education, Balance training, Gait training, Patient/Family education, Joint mobilization, Aquatic Therapy, Dry Needling, Cryotherapy, Moist heat, Taping, Vasopneumatic device, and Manual therapy   PLAN FOR NEXT SESSION:  trial tandem stance; progress gait and R ankle strengthening as able   Stanton Kidney Tharon Aquas) Janeva Peaster MPT 05/16/21 1:19 PM

## 2021-05-22 ENCOUNTER — Other Ambulatory Visit: Payer: Self-pay

## 2021-05-22 ENCOUNTER — Ambulatory Visit: Payer: Managed Care, Other (non HMO) | Attending: Sports Medicine

## 2021-05-22 DIAGNOSIS — M25571 Pain in right ankle and joints of right foot: Secondary | ICD-10-CM | POA: Diagnosis not present

## 2021-05-22 DIAGNOSIS — M6281 Muscle weakness (generalized): Secondary | ICD-10-CM | POA: Diagnosis present

## 2021-05-22 NOTE — Therapy (Addendum)
?OUTPATIENT PHYSICAL THERAPY TREATMENT NOTE/DISCHARGE ?  ?PHYSICAL THERAPY DISCHARGE SUMMARY ? ?Visits from Start of Care: 9 ? ?Current functional level related to goals / functional outcomes: ?See goals and objective ?  ?Remaining deficits: ?See goals and objective ?  ?Education / Equipment: ?HEP  ? ?Patient agrees to discharge. Patient goals were  see goals and objective . Patient is being discharged due to not returning since the last visit. ? ? ?Patient Name: Krista Hogan ?MRN: [id number] ?DOB:1980/04/20, 41 y.o., female ?Today's Date: 05/22/2021 ? ?PCP: Minette Brine, FNP ?REFERRING PROVIDER: Landis Martins, DPM ? ? PT End of Session - 05/22/21 1448   ? ? Visit Number 9   ? Number of Visits 17   ? Date for PT Re-Evaluation 06/11/21   ? Authorization Type [employer] Managed   ? PT Start Time 1448   ? PT Stop Time 1526   ? PT Time Calculation (min) 38 min   ? Activity Tolerance Patient tolerated treatment well;Patient limited by pain   ? Behavior During Therapy [facility] for tasks assessed/performed   ? ?  ?  ? ?  ? ? ? ? ? ?Past Medical History:  ?Diagnosis Date  ? DUB (dysfunctional uterine bleeding)   ? Graves disease 06/2016  ? History of abnormal cervical Pap smear   ? 2011  ? History of HPV infection   ? Uterine fibroid   ? Wears glasses   ? ?Past Surgical History:  ?Procedure Laterality Date  ? CESAREAN SECTION N/A 01/06/2018  ? Procedure: CESAREAN SECTION;  Surgeon: Caren Macadam, MD;  Location: [place];  Service: Obstetrics;  Laterality: N/A;  ? EXCISION OF SKIN TAG  11/22/2014  ? Procedure: EXCISION OF SKIN TAG VULVA;  Surgeon: Governor Specking, MD;  Location: [place];  Service: Gynecology;;  ? [place] N/A 11/22/2014  ? Procedure: LAPAROSCOPIC GELPORT ASSISTED MYOMECTOMY WITH CHROMOTUBATION;  Surgeon: Governor Specking, MD;  Location: [place];  Service: Gynecology;  Laterality: N/A;  ? LAPAROSCOPY N/A 11/22/2014  ? Procedure:  LAPAROSCOPY DIAGNOSTIC;  Surgeon: Governor Specking, MD;  Location: [facility];  Service: Gynecology;  Laterality: N/A;  ? LEEP  2011  ? TONSILLECTOMY AND ADENOIDECTOMY  age 29  ? WISDOM TOOTH EXTRACTION  2006  ? ?Patient Active Problem List  ? Diagnosis Date Noted  ? Right lower quadrant pain 03/29/2020  ? Chronic idiopathic constipation 03/29/2020  ? Acute anal fissure 03/29/2020  ? Irritant dermatitis 03/21/2020  ? Itching of both hands 12/27/2019  ? Acne scarring 11/02/2019  ? Acne vulgaris 11/02/2019  ? Hyperpigmentation 11/02/2019  ? Gastroesophageal reflux disease without esophagitis 08/29/2019  ? Globus pharyngeus 08/29/2019  ? Nasal turbinate hypertrophy 08/29/2019  ? Rhinitis, chronic 08/29/2019  ? Throat irritation 08/29/2019  ? Palpitations 02/25/2019  ? Dysesthesia 02/01/2019  ? Groin pain 02/01/2019  ? Obesity 02/01/2019  ? Postpartum hypertension 01/10/2018  ? S/P repeat low transverse C-section 01/06/2018  ? Supervision of other normal pregnancy, antepartum 07/08/2017  ? Graves disease 10/01/2016  ? Genital herpes simplex 07/29/2016  ? Cyst of ovary 07/28/2016  ? Genital warts 07/28/2016  ? 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 Migraine 07/28/2016  ? Hypothyroidism 12/17/2015  ? Mucosal irritation of oral cavity 12/17/2015  ? Otalgia, right 12/17/2015  ? Chronic pelvic pain in female 11/15/2015  ? 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  ? ? ?REFERRING DIAG:  ?M77.9 (ICD-10-CM) - Capsulitis ?M77.8 (ICD-10-CM) - Extensor tendinitis of foot ?M79.671 (  ICD-10-CM) - Right foot pain ?M25.571 (ICD-10-CM) - Sinus tarsi syndrome of right foot ? ?THERAPY DIAG:  ?Pain in right ankle and joints of right foot ? ?Muscle weakness (generalized) ? ?PERTINENT HISTORY: Hx of lateral R foot/ankle pain since August 2022 ? ?PRECAUTIONS: None ? ?SUBJECTIVE:  ?Pt presents to PT with continued reports of R foot pain. Has not had much change in status, notes she had pain in aquatic therapy last session.  Pt has tried to be compliant with her HEP.  ? ?Pain: ?Are you having pain? Yes ?NPRS: 8/10 ?Pain Location: R anterior foot ?Pain Frequency/Description: intermittent  ?Aggravating Factors: walking, driving, standing  ?Relieving Factors: rest ? ? ?OBJECTIVE:  ?Outcomes: ?FOTO: 38% function ? ?Range of Motion: ?R ankle DF: 0 deg ? ?Strength: ?MMT Right ?03/14/2021 Right ?04/30/2021  ?Ankle dorsiflexion 3/5  3+/5  ?Ankle plantarflexion 3+/5  4/5  ?Ankle inversion 3+/5  3+/5  ?Ankle eversion 2+/5  2+/5  ? ? ?Interventions: ? ?[facility] Adult PT Treatment:                                                DATE: 05/22/2021 ?Interventions: ?Therapeutic Exercise: ?NuStep lvl 5 LE only x 4 min while taking subjective ?Ankle PF, inv, ev RTB x 10 each ?Long sitting calf stretch x 30" R ?Seated heel-toe raises x 15 ?Therapeutic Activity: ?Assessment of tests/measures, goals, and outcomes ? ?Patient Education:  ?Education details: continue HEP ?Person educated: Patient ?Education method: Explanation and Demonstration ?Education comprehension: verbalized understanding and returned demonstration ? ? Home Exercise Program: ?Access Code: [email or web address] ?URL: [email or web address] ?Date: 05/22/2021 ?Prepared by: Octavio Manns ? ?Exercises ?Long Sitting Calf Stretch with Strap - 1 x daily - 7 x weekly - 3 reps - 30 sec hold ?Long Sitting Ankle Plantar Flexion with Resistance - 1 x daily - 7 x weekly - 2 sets - 10 reps ?Ankle Inversion with Resistance - 1 x daily - 7 x weekly - 2 sets - 10 reps ?Ankle Eversion with Resistance - 1 x daily - 7 x weekly - 2 sets - 10 reps ?Seated Heel Toe Raises - 1 x daily - 7 x weekly - 3 sets - 15 reps ?  ?ASSESSMENT: ?Pt was able to complete all prescribed and demonstrated knowledge of HEP. Over the course of PT treatment, pt has been unable to progress well with therapy, with no change in objective measures or significant change in FOTO. At this point PT will wait until pt's f/u visit with DPM on 3/30  before proceeding with treatment, as she has just not made needed progress and may need further testing or measures for reducing R foot pain. Will await f/u visit and have pt continue with current HEP as able.  ? ?Problem List: ?decreased balance, decreased endurance, decreased mobility, difficulty walking, decreased ROM, decreased strength, and pain ? ? ?GOALS: ?Goals reviewed with patient? No ?  ?SHORT TERM GOALS: ?  ?STG Name Target Date Goal status  ?1 Pt will be compliant and knowledgeable with initial HEP for improved comfort and carryover ?Baseline: initial HEP given 04/04/2021 ?04/24/21 ACHIEVED  ?2 Pt will self report R foot/ankle pain no greater than 6/10 for improved comfort and functional ability ?Baseline: 10/10 R foot/ankle pain at worst 04/04/2021 ONGOING  ?  ?LONG TERM GOALS:  ?  ?LTG Name Target Date Goal status  ?1 Pt  will improve FOTO function score to no less than 62% as proxy for functional improvement  ?Baseline: 34% function ?04/30/2021: 50% function ?05/22/2021: 38% function 06/11/2021 ONGOING  ?2 Pt will self report R foot/ankle pain no greater than 3/10 for improved comfort and functional ability  ?Baseline: 10/10 R foot/ankle pain at worst 06/11/2021 ONGOING  ?3 Pt will be able hold SLS on R LE no less than 15 seconds for improved balance and mobility ?Baseline: unable ?05/22/2021: UNABLE 06/11/2021 ONGOING  ?4 Pt will improve R ankle DF no less than 10 deg for improved functional mobility ?Baseline: 6 deg 06/11/2021 ONGOING  ?5 Pt will improve R ankle MMT to no less than 4/5 for all tested motions in order to improve comfort and functional ability ?Baseline: see chart 06/11/2021 ONGOING  ?  ?PLAN: ?PT FREQUENCY: 2x/week ?  ?PT DURATION: 6 weeks ?  ?PLANNED INTERVENTIONS: Therapeutic exercises, Therapeutic activity, Neuro Muscular re-education, Balance training, Gait training, Patient/Family education, Joint mobilization, Aquatic Therapy, Dry Needling, Cryotherapy, Moist heat, Taping, Vasopneumatic  device, and Manual therapy ?  ?PLAN FOR NEXT SESSION:  trial tandem stance; progress gait and R ankle strengthening as able ? ? ?Ward Chatters, PT ?05/22/21 4:06 PM ? ? ?  ?  ?

## 2021-06-06 ENCOUNTER — Encounter: Payer: Self-pay | Admitting: Sports Medicine

## 2021-06-06 ENCOUNTER — Ambulatory Visit: Payer: Managed Care, Other (non HMO) | Admitting: Sports Medicine

## 2021-06-06 DIAGNOSIS — M79671 Pain in right foot: Secondary | ICD-10-CM

## 2021-06-06 DIAGNOSIS — M779 Enthesopathy, unspecified: Secondary | ICD-10-CM | POA: Diagnosis not present

## 2021-06-06 DIAGNOSIS — M792 Neuralgia and neuritis, unspecified: Secondary | ICD-10-CM

## 2021-06-06 DIAGNOSIS — G8929 Other chronic pain: Secondary | ICD-10-CM

## 2021-06-06 DIAGNOSIS — M1990 Unspecified osteoarthritis, unspecified site: Secondary | ICD-10-CM | POA: Diagnosis not present

## 2021-06-06 MED ORDER — HYDROCODONE-ACETAMINOPHEN 10-325 MG PO TABS: 1.0000 | ORAL_TABLET | Freq: Four times a day (QID) | ORAL | 0 refills | Status: DC | PRN

## 2021-06-06 NOTE — Progress Notes (Signed)
Subjective: ?Krista Hogan is a 41 y.o. female patient who presents to office for follow-up evaluation of right foot pain.  Patient reports that there is still pretty significant pain on average 7 out of 10 states that her old cam boot seems to help for a little better states that she is still painful at the top of the foot and ankle and states that now she feels like there is pain that radiates up her leg states that she has been going to physical therapy and with therapy she has been having increased episodes of pain but anytime that she is in the water doing therapy she has a lot of relief with her symptoms.  Patient questions several times with this the pain she is going to have to live with and is wondering what can she do to help herself get better. ? ?Patient Active Problem List  ? Diagnosis Date Noted  ? Right lower quadrant pain 03/29/2020  ? Chronic idiopathic constipation 03/29/2020  ? Acute anal fissure 03/29/2020  ? Irritant dermatitis 03/21/2020  ? Itching of both hands 12/27/2019  ? Acne scarring 11/02/2019  ? Acne vulgaris 11/02/2019  ? Hyperpigmentation 11/02/2019  ? Gastroesophageal reflux disease without esophagitis 08/29/2019  ? Globus pharyngeus 08/29/2019  ? Nasal turbinate hypertrophy 08/29/2019  ? Rhinitis, chronic 08/29/2019  ? Throat irritation 08/29/2019  ? Palpitations 02/25/2019  ? Dysesthesia 02/01/2019  ? Groin pain 02/01/2019  ? Obesity 02/01/2019  ? Postpartum hypertension 01/10/2018  ? S/P repeat low transverse C-section 01/06/2018  ? Supervision of other normal pregnancy, antepartum 07/08/2017  ? Graves disease 10/01/2016  ? Genital herpes simplex 07/29/2016  ? Cyst of ovary 07/28/2016  ? Genital warts 07/28/2016  ? 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 Migraine 07/28/2016  ? Hypothyroidism 12/17/2015  ? Mucosal irritation of oral cavity 12/17/2015  ? Otalgia, right 12/17/2015  ? Chronic pelvic pain in female 11/15/2015  ? 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   ? ? ?Current Outpatient Medications on File Prior to Visit  ?Medication Sig Dispense Refill  ? acetaminophen (TYLENOL) 500 MG tablet Take 500 mg by mouth every 6 (six) hours as needed for moderate pain. (Patient not taking: Reported on 08/02/2020)    ? cetirizine (ZYRTEC) 10 MG tablet Take 1 tablet (10 mg total) by mouth daily. 30 tablet 11  ? cholecalciferol (VITAMIN D) 1000 units tablet Take 1,000 Units by mouth daily.    ? clindamycin-benzoyl peroxide (BENZACLIN) gel Apply 1 application topically every morning.    ? cyanocobalamin 1000 MCG tablet Take 1,000 mcg by mouth daily.    ? cyclobenzaprine (FLEXERIL) 10 MG tablet Take 10 mg by mouth every 8 (eight) hours as needed.    ? fluocinonide ointment (LIDEX) 0.05 % Apply to affected area on neck twice daily x 2 wk, then daily x 2 wk, then taper to tacrolimus    ? gabapentin (NEURONTIN) 100 MG capsule Take 1 capsule (100 mg total) by mouth at bedtime. 90 capsule 3  ? hydroquinone 4 % cream Apply to face nightly to every other night with tretinoin    ? ibuprofen (ADVIL) 800 MG tablet TAKE 1 TABLET BY MOUTH EVERY 8 HOURS AS NEEDED 30 tablet 5  ? neomycin-polymyxin-hydrocortisone (CORTISPORIN) OTIC solution Apply 1-2 drops to toenail after soaking and then cover with bandaid 10 mL 0  ? norethindrone (MICRONOR) 0.35 MG tablet Take 1 tablet by mouth daily.    ? norethindrone-ethinyl estradiol 1/35 (NORTREL 1/35, 28,) tablet Take 1 tablet by  mouth daily. 28 tablet 11  ? Omega-3 Fatty Acids (FISH OIL) 1000 MG CAPS Take 1,000 mg by mouth daily.     ? PARoxetine (PAXIL) 20 MG tablet Take 1 tablet (20 mg total) by mouth daily. 30 tablet 11  ? predniSONE (STERAPRED UNI-PAK 21 TAB) 10 MG (21) TBPK tablet Take as directed 21 tablet 0  ? sertraline (ZOLOFT) 50 MG tablet Take 50 mg by mouth daily.    ? SPIRONOLACTONE PO Take by mouth daily.    ? tacrolimus (PROTOPIC) 0.1 % ointment Apply to neck daily to twice daily as needed.    ? traMADol (ULTRAM) 50 MG tablet TAKE 1 TABLET BY  MOUTH EVERY 8 HOURS AS NEEDED FOR  UP  TO  5  DAYS (Patient not taking: Reported on 08/02/2020) 15 tablet 0  ? tretinoin (RETIN-A) 0.025 % cream Apply to face nightly to tolerance    ? triamcinolone cream (KENALOG) 0.5 % Apply topically. (Patient not taking: Reported on 08/02/2020)    ? ?Current Facility-Administered Medications on File Prior to Visit  ?Medication Dose Route Frequency Provider Last Rate Last Admin  ? triamcinolone acetonide (KENALOG) 10 MG/ML injection 10 mg  10 mg Other Once Landis Martins, DPM      ? ? ?Allergies  ?Allergen Reactions  ? Minocycline Rash  ? ? ?Objective:  ?General: Alert and oriented x3 in no acute distress ? ?Dermatology: No open lesions bilateral lower extremities, no webspace macerations, no ecchymosis bilateral, all nails x 10 are well manicured. ? ?Vascular: Dorsalis Pedis and Posterior Tibial pedal pulses palpable, Capillary Fill Time 3 seconds,(+) pedal hair growth bilateral, minimal localized edema to the right lateral foot and ankle, temperature gradient within normal limits. ? ?Neurology: Gross sensation intact via light touch bilateral.  Resolved sharp shooting pain over the right lateral foot and ankle and lower leg with gabapentin as previously noted. ? ?Musculoskeletal: Mild to moderate tenderness over the dorsal lateral fourth and fifth metatarsal bases extending to the lateral ankle and to the sinus tarsi area of the right foot with worse pain today at the sinus tarsi and lateral lower leg.  There is localized swelling without warmth to this area mild guarding due to pain unchanged from prior. ? ? ?Assessment and Plan: ?Problem List Items Addressed This Visit   ?None ?Visit Diagnoses   ? ? Arthritis    -  Primary  ? Relevant Medications  ? HYDROcodone-acetaminophen (NORCO) 10-325 MG tablet  ? Other Relevant Orders  ? CBC with Differential  ? Basic Metabolic Panel  ? Sedimentation Rate  ? C-reactive protein  ? ANA  ? Uric Acid  ? HLA-B27 Antigen  ? Vitamin B12  ?  Hemoglobin A1c  ? Vitamin D, 25-hydroxy  ? Tendonitis      ? Neuritis      ? Chronic foot pain, right      ? Relevant Medications  ? HYDROcodone-acetaminophen (NORCO) 10-325 MG tablet  ? Right foot pain      ? ?  ?  ? ?-Complete examination performed ?-Re- discussed treatment options for chronic ongoing right foot and ankle pain ?-Rx arthritic panel including vitamin B, vitamin D and A1c for further evaluation to help confirm a diagnosis of her pain since previous imaging has been negative ?-Advised patient to help herself get better she should continue to listen to her body and symptoms if there is pain she should take.'s of rest or breaks and limit her walking standing and activities to her tolerance  advised and educated patient on heat and ice therapy to assist with pain and and edema control and advised patient that at this point there is nothing confirming her diagnosis and that I am thinking that she is developing chronic regional pain syndrome ?-Patient still to to continue with aquatic physical therapy since it is helpful ?-Continue with gabapentin at bedtime to help with sharp shooting pains like before ?-For this time only refill hydrocodone for any pain not relieved by her gabapentin again this visit since that is the only thing that tends to help ?-Continue with cam boot for long distance and may tolerate crocs for shorter distances ?-Patient to return after blood work or sooner if problems or issues arise. ?  ?

## 2021-06-09 ENCOUNTER — Encounter: Payer: Self-pay | Admitting: Sports Medicine

## 2021-06-09 ENCOUNTER — Other Ambulatory Visit: Payer: Self-pay | Admitting: Sports Medicine

## 2021-06-10 ENCOUNTER — Other Ambulatory Visit: Payer: Self-pay | Admitting: Sports Medicine

## 2021-06-10 IMAGING — US US PELVIS COMPLETE WITH TRANSVAGINAL
1 series · 13 of 25 positions shown · non-contrast
Comparison: 06/26/2014

CLINICAL DATA: Uterine fibroids, follow-up, unknown LMP



[Series 1: us pelvic complete with transvaginal · 13 of 76 slices shown]
[im 1/76]
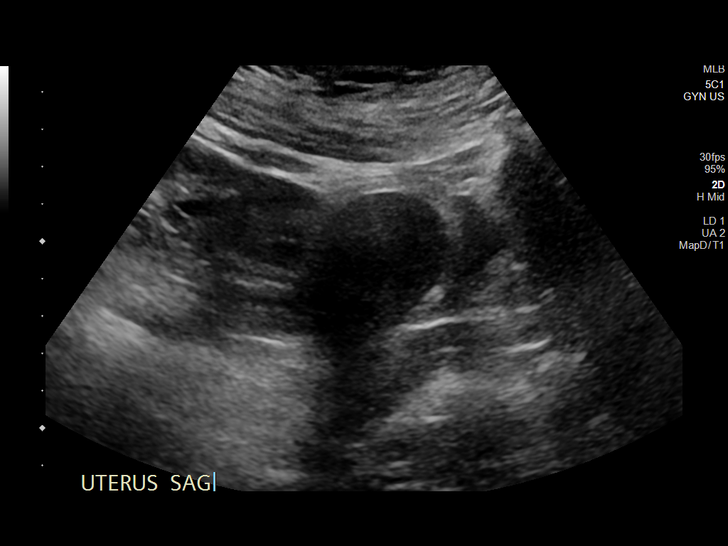
[im 7/76]
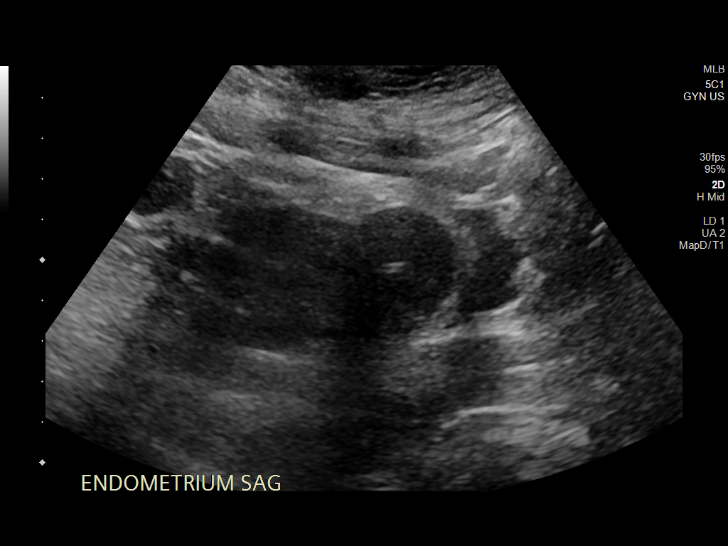
[im 13/76]
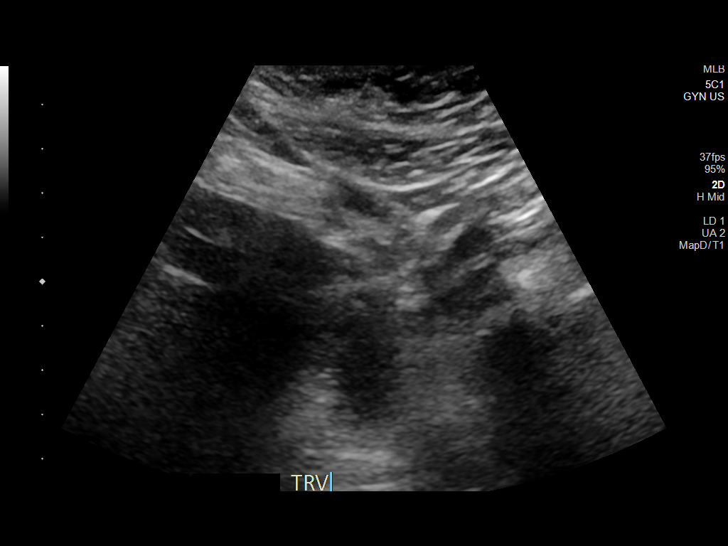
[im 19/76]
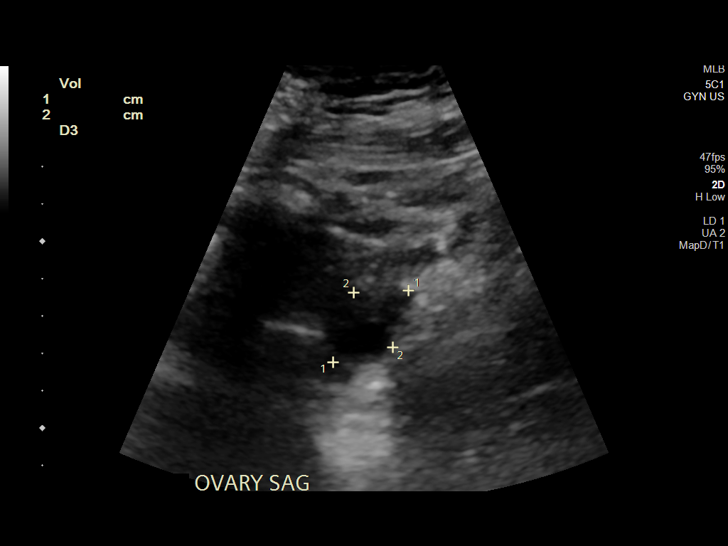
[im 26/76]
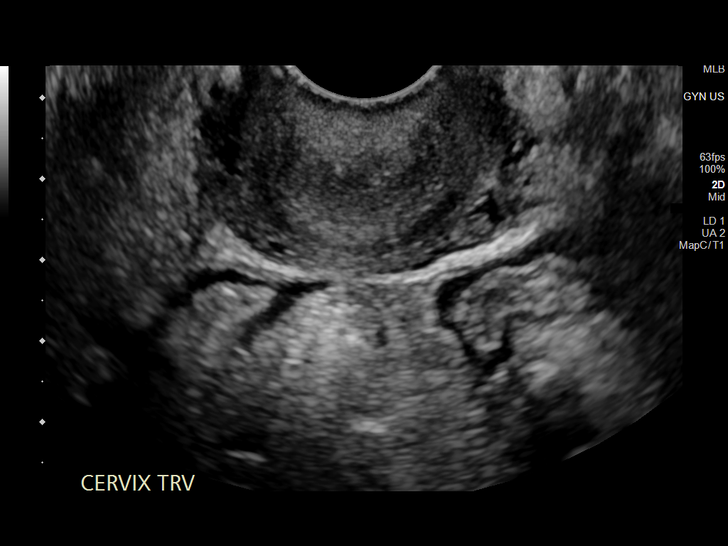
[im 32/76]
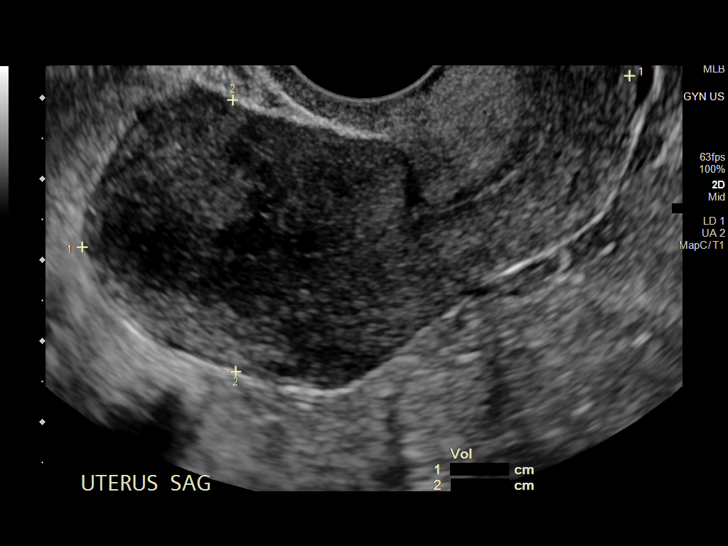
[im 38/76]
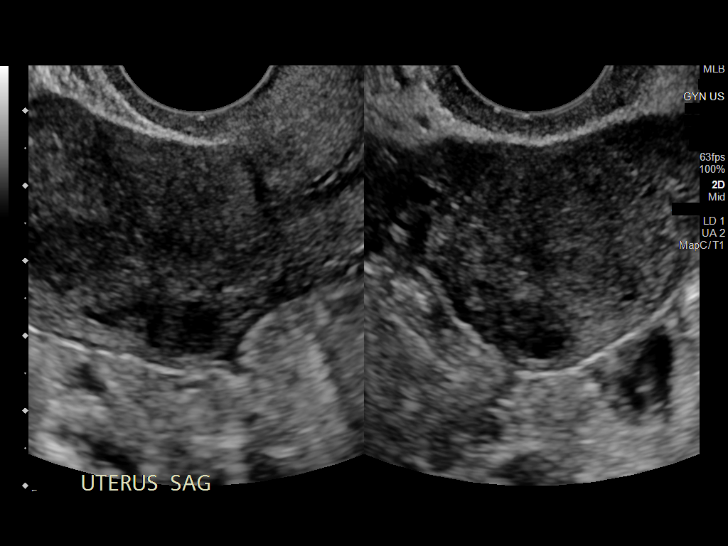
[im 44/76]
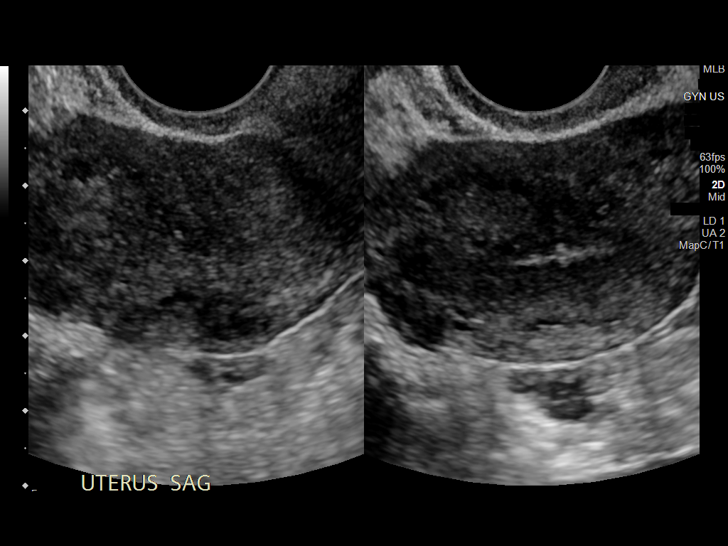
[im 51/76]
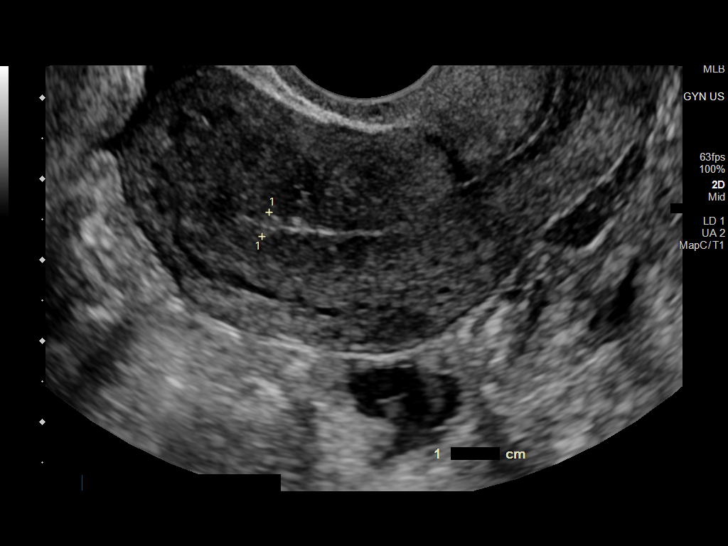
[im 57/76]
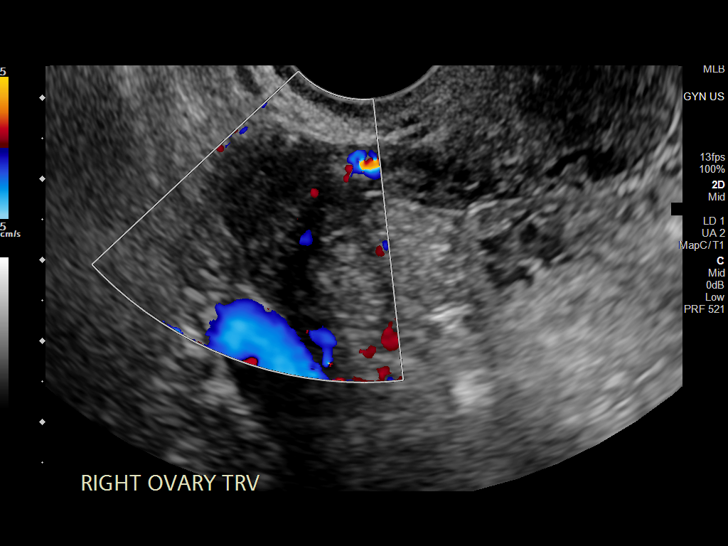
[im 63/76]
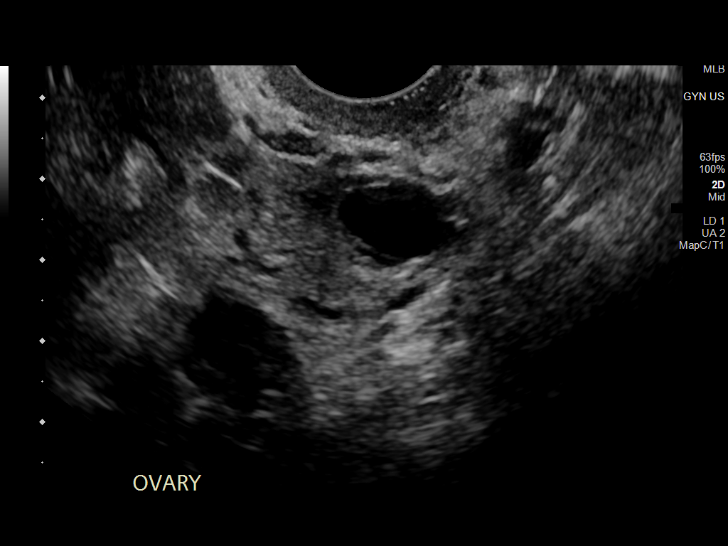
[im 69/76]
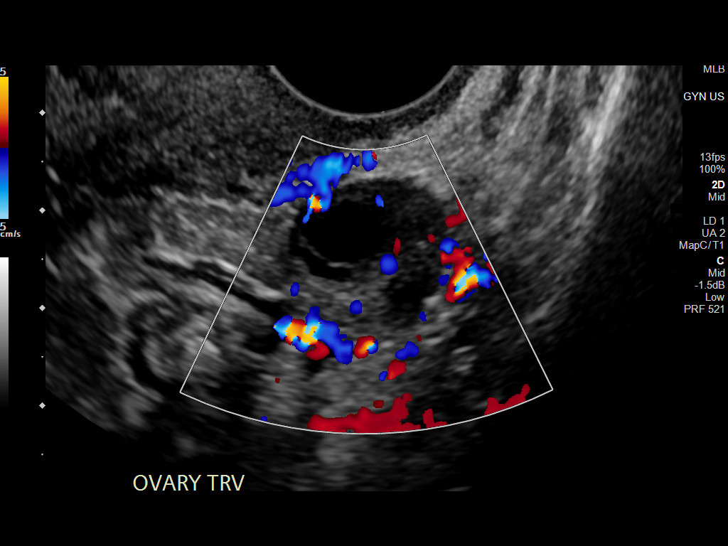
[im 76/76]
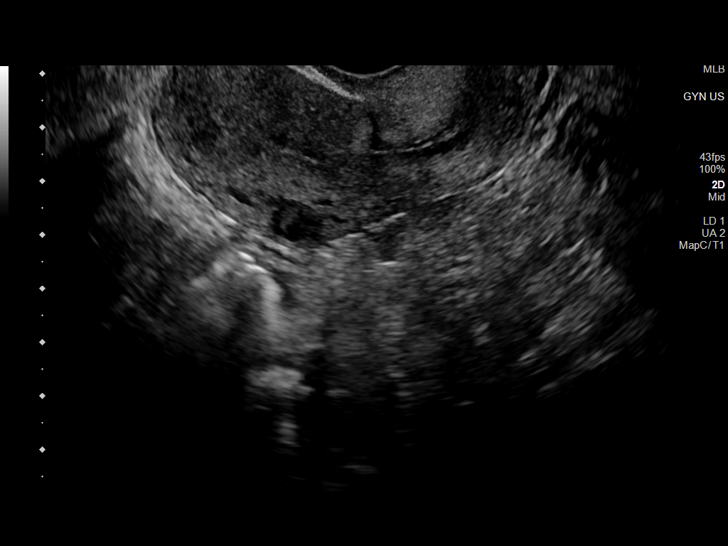

[13 of 25 positions shown; findings below may reference images not displayed]

FINDINGS: Uterus

Measurements: 7.1 x 3.4 x 4.5 cm = volume: 56 mL. Anteverted. Mildly
heterogeneous myometrium. Intramural leiomyoma posterior mid uterus
11 mm diameter. Two small intramural leiomyomata at upper RIGHT
uterus near fundus measuring 12 mm and 7 mm in greatest size.

Endometrium

Thickness: 3 mm.  No endometrial fluid or focal abnormality

Right ovary

Measurements: 2.3 x 1.5 x 1.6 cm = volume: 2.9 mL. Normal morphology
without mass

Left ovary

Measurements: 2.6 x 2.0 x 1.6 cm = volume: 4.3 mL. Normal morphology
without mass

Other findings

No free pelvic fluid.  No adnexal masses.
IMPRESSION: Three small uterine leiomyomata are identified, largest 11 mm
diameter.

Unremarkable endometrial complex and ovaries.

## 2021-06-10 MED ORDER — HYDROCODONE-ACETAMINOPHEN 10-325 MG PO TABS: 1.0000 | ORAL_TABLET | Freq: Four times a day (QID) | ORAL | 0 refills | Status: AC | PRN

## 2021-06-10 NOTE — Progress Notes (Signed)
Changed Rx to correct Pharmacy ?

## 2021-07-04 LAB — CBC WITH DIFFERENTIAL/PLATELET
Basophils Absolute: 0 10*3/uL (ref 0.0–0.2)
Basos: 1 %
EOS (ABSOLUTE): 0.1 10*3/uL (ref 0.0–0.4)
Eos: 1 %
Hematocrit: 42 % (ref 34.0–46.6)
Hemoglobin: 14 g/dL (ref 11.1–15.9)
Immature Grans (Abs): 0 10*3/uL (ref 0.0–0.1)
Immature Granulocytes: 0 %
Lymphocytes Absolute: 3.4 10*3/uL — ABNORMAL HIGH (ref 0.7–3.1)
Lymphs: 41 %
MCH: 31.5 pg (ref 26.6–33.0)
MCHC: 33.3 g/dL (ref 31.5–35.7)
MCV: 95 fL (ref 79–97)
Monocytes Absolute: 0.6 10*3/uL (ref 0.1–0.9)
Monocytes: 8 %
Neutrophils Absolute: 4.1 10*3/uL (ref 1.4–7.0)
Neutrophils: 49 %
Platelets: 403 10*3/uL (ref 150–450)
RBC: 4.44 x10E6/uL (ref 3.77–5.28)
RDW: 12.5 % (ref 11.7–15.4)
WBC: 8.2 10*3/uL (ref 3.4–10.8)

## 2021-07-04 LAB — URIC ACID: Uric Acid: 5.4 mg/dL (ref 2.6–6.2)

## 2021-07-04 LAB — HEMOGLOBIN A1C
Est. average glucose Bld gHb Est-mCnc: 120 mg/dL
Hgb A1c MFr Bld: 5.8 % — ABNORMAL HIGH (ref 4.8–5.6)

## 2021-07-04 LAB — BASIC METABOLIC PANEL
BUN/Creatinine Ratio: 14 (ref 9–23)
BUN: 11 mg/dL (ref 6–20)
CO2: 22 mmol/L (ref 20–29)
Calcium: 10 mg/dL (ref 8.7–10.2)
Chloride: 101 mmol/L (ref 96–106)
Creatinine, Ser: 0.79 mg/dL (ref 0.57–1.00)
Glucose: 105 mg/dL — ABNORMAL HIGH (ref 70–99)
Potassium: 4.6 mmol/L (ref 3.5–5.2)
Sodium: 141 mmol/L (ref 134–144)
eGFR: 98 mL/min/{1.73_m2} (ref >59)

## 2021-07-04 LAB — ANA: Anti Nuclear Antibody (ANA): NEGATIVE

## 2021-07-04 LAB — SEDIMENTATION RATE: Sed Rate: 8 mm/hr (ref 0–32)

## 2021-07-04 LAB — VITAMIN D 25 HYDROXY (VIT D DEFICIENCY, FRACTURES): Vit D, 25-Hydroxy: 58.5 ng/mL (ref 30.0–100.0)

## 2021-07-04 LAB — HLA-B27 ANTIGEN: HLA B27: NEGATIVE

## 2021-07-04 LAB — VITAMIN B12: Vitamin B-12: 1669 pg/mL — ABNORMAL HIGH (ref 232–1245)

## 2021-07-04 LAB — C-REACTIVE PROTEIN: CRP: 16 mg/L — ABNORMAL HIGH (ref 0–10)

## 2021-07-07 ENCOUNTER — Encounter: Payer: Self-pay | Admitting: Sports Medicine

## 2021-07-11 ENCOUNTER — Encounter: Payer: Self-pay | Admitting: Nurse Practitioner

## 2021-07-11 ENCOUNTER — Ambulatory Visit: Payer: Managed Care, Other (non HMO) | Admitting: Sports Medicine

## 2021-07-11 ENCOUNTER — Encounter: Payer: Self-pay | Admitting: Sports Medicine

## 2021-07-11 DIAGNOSIS — Z9889 Other specified postprocedural states: Secondary | ICD-10-CM

## 2021-07-11 DIAGNOSIS — G90521 Complex regional pain syndrome I of right lower limb: Secondary | ICD-10-CM | POA: Diagnosis not present

## 2021-07-11 DIAGNOSIS — M79675 Pain in left toe(s): Secondary | ICD-10-CM

## 2021-07-11 DIAGNOSIS — G8929 Other chronic pain: Secondary | ICD-10-CM

## 2021-07-11 DIAGNOSIS — M25476 Effusion, unspecified foot: Secondary | ICD-10-CM

## 2021-07-11 DIAGNOSIS — M79671 Pain in right foot: Secondary | ICD-10-CM | POA: Diagnosis not present

## 2021-07-11 MED ORDER — PREGABALIN 25 MG PO CAPS: 25.0000 mg | ORAL_CAPSULE | Freq: Every day | ORAL | 2 refills | Status: DC

## 2021-07-11 MED ORDER — HYDROCODONE-ACETAMINOPHEN 10-325 MG PO TABS: 1.0000 | ORAL_TABLET | Freq: Four times a day (QID) | ORAL | 0 refills | Status: AC | PRN

## 2021-07-11 NOTE — Patient Instructions (Signed)
Recommend Dr. Sherryle Lis for any other future needs you may have with your foot ?

## 2021-07-11 NOTE — Progress Notes (Signed)
Subjective: ?Krista Hogan is a 41 y.o. female patient who presents to office for follow-up evaluation of right foot pain.  Patient reports that she is still having pain but has been trying to wear her crocs but sometimes it feels like it is swelling inside of her crocs in the right shoe is fitting tighter states that she takes her gabapentin which helps with a sharp shooting pain but makes her very sleepy and wants to see if there is any other medications that she can try.  Patient is also requesting a refill on her pain medicine 1 more time to help with any pain that may not be relieved by her nerve medicine.  Patient also is concerned about peeling skin at the left hallux nail fold.  Patient denies any significant redness warmth swelling or drainage or any other acute symptoms at this time. ? ?Patient Active Problem List  ? Diagnosis Date Noted  ? Right lower quadrant pain 03/29/2020  ? Chronic idiopathic constipation 03/29/2020  ? Acute anal fissure 03/29/2020  ? Irritant dermatitis 03/21/2020  ? Itching of both hands 12/27/2019  ? Acne scarring 11/02/2019  ? Acne vulgaris 11/02/2019  ? Hyperpigmentation 11/02/2019  ? Gastroesophageal reflux disease without esophagitis 08/29/2019  ? Globus pharyngeus 08/29/2019  ? Nasal turbinate hypertrophy 08/29/2019  ? Rhinitis, chronic 08/29/2019  ? Throat irritation 08/29/2019  ? Palpitations 02/25/2019  ? Dysesthesia 02/01/2019  ? Groin pain 02/01/2019  ? Obesity 02/01/2019  ? Postpartum hypertension 01/10/2018  ? S/P repeat low transverse C-section 01/06/2018  ? Supervision of other normal pregnancy, antepartum 07/08/2017  ? Graves disease 10/01/2016  ? Genital herpes simplex 07/29/2016  ? Cyst of ovary 07/28/2016  ? Genital warts 07/28/2016  ? 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 Migraine 07/28/2016  ? Hypothyroidism 12/17/2015  ? Mucosal irritation of oral cavity 12/17/2015  ? Otalgia, right 12/17/2015  ? Chronic pelvic pain in female 11/15/2015  ? 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 ? ?Current Outpatient Medications on File Prior to Visit  ?Medication Sig Dispense Refill  ? acetaminophen (TYLENOL) 500 MG tablet Take 500 mg by mouth every 6 (six) hours as needed for moderate pain. (Patient not taking: Reported on 08/02/2020)    ? cetirizine (ZYRTEC) 10 MG tablet Take 1 tablet (10 mg total) by mouth daily. 30 tablet 11  ? cholecalciferol (VITAMIN D) 1000 units tablet Take 1,000 Units by mouth daily.    ? clindamycin-benzoyl peroxide (BENZACLIN) gel Apply 1 application topically every morning.    ? cyanocobalamin 1000 MCG tablet Take 1,000 mcg by mouth daily.    ? cyclobenzaprine (FLEXERIL) 10 MG tablet Take 10 mg by mouth every 8 (eight) hours as needed.    ? fluocinonide ointment (LIDEX) 0.05 % Apply to affected area on neck twice daily x 2 wk, then daily x 2 wk, then taper to tacrolimus    ? gabapentin (NEURONTIN) 100 MG capsule Take 1 capsule (100 mg total) by mouth at bedtime. 90 capsule 3  ? hydroquinone 4 % cream Apply to face nightly to every other night with tretinoin    ? ibuprofen (ADVIL) 800 MG tablet TAKE 1 TABLET BY MOUTH EVERY 8 HOURS AS NEEDED 30 tablet 5  ? neomycin-polymyxin-hydrocortisone (CORTISPORIN) OTIC solution Apply 1-2 drops to toenail after soaking and then cover with bandaid 10 mL 0  ? norethindrone (MICRONOR) 0.35 MG tablet Take 1 tablet by mouth daily.    ? norethindrone-ethinyl estradiol 1/35 (NORTREL 1/35, 28,) tablet Take 1  tablet by mouth daily. 28 tablet 11  ? Omega-3 Fatty Acids (FISH OIL) 1000 MG CAPS Take 1,000 mg by mouth daily.     ? PARoxetine (PAXIL) 20 MG tablet Take 1 tablet (20 mg total) by mouth daily. 30 tablet 11  ? predniSONE (STERAPRED UNI-PAK 21 TAB) 10 MG (21) TBPK tablet Take as directed 21 tablet 0  ? sertraline (ZOLOFT) 50 MG tablet Take 50 mg by mouth daily.    ? SPIRONOLACTONE PO Take by mouth daily.    ? tacrolimus (PROTOPIC) 0.1 % ointment Apply to neck daily to twice daily as needed.    ? traMADol (ULTRAM)  50 MG tablet TAKE 1 TABLET BY MOUTH EVERY 8 HOURS AS NEEDED FOR  UP  TO  5  DAYS (Patient not taking: Reported on 08/02/2020) 15 tablet 0  ? tretinoin (RETIN-A) 0.025 % cream Apply to face nightly to tolerance    ? triamcinolone cream (KENALOG) 0.5 % Apply topically. (Patient not taking: Reported on 08/02/2020)    ? ?Current Facility-Administered Medications on File Prior to Visit  ?Medication Dose Route Frequency Provider Last Rate Last Admin  ? triamcinolone acetonide (KENALOG) 10 MG/ML injection 10 mg  10 mg Other Once Landis Martins, DPM      ? ? ?Allergies  ?Allergen Reactions  ? Minocycline Rash  ? ? ?Objective:  ?General: Alert and oriented x3 in no acute distress ? ?Dermatology: No open lesions bilateral lower extremities, no webspace macerations, no ecchymosis bilateral, all nails x 10 are well manicured except with dry skin noted at the left hallux lateral nail fold. ? ?Vascular: Dorsalis Pedis and Posterior Tibial pedal pulses palpable, Capillary Fill Time 3 seconds,(+) pedal hair growth bilateral, minimal localized edema to the right lateral foot and ankle, temperature gradient within normal limits. ? ?Neurology: Gross sensation intact via light touch bilateral.  Minimal sharp shooting pain over the right lateral foot and ankle and lower leg.  Gabapentin has helped this but per patient it makes her feel sleepy and wants a medication change. ? ?Musculoskeletal: Mild to moderate tenderness over the dorsal lateral fourth and fifth metatarsal bases extending to the lateral ankle and to the sinus tarsi area of the right foot which is unchanged from prior.  There is no frank ankle instability noted.  There is localized swelling without warmth to this area mild guarding due to pain unchanged from prior.  Pes planus foot type. ? ? ?Assessment and Plan: ?Problem List Items Addressed This Visit   ?None ?Visit Diagnoses   ? ? Soft tissue swelling of joint of foot    -  Primary  ? Complex regional pain syndrome type  1 of right lower extremity      ? Relevant Medications  ? pregabalin (LYRICA) 25 MG capsule  ? HYDROcodone-acetaminophen (NORCO) 10-325 MG tablet  ? Chronic foot pain, right      ? Relevant Medications  ? pregabalin (LYRICA) 25 MG capsule  ? HYDROcodone-acetaminophen (NORCO) 10-325 MG tablet  ? Other Relevant Orders  ? Ambulatory referral to Neurology  ? S/P nail surgery      ? Toe pain, left      ? ?  ?  ? ?-Complete examination performed ?-Left hallux nail was noted to have dry skin at the lateral nail fold which I removed using a tissue nipper and advised patient if there is soreness may soak with Epsom salt and apply Neosporin over the next 2 days if toenail worsens to return to office sooner  for this issue ?-Re- discussed treatment options for chronic ongoing right foot and ankle pain ?-For this time only refill hydrocodone for any pain not relieved by Lyrica since this is a new change from the gabapentin patient reports that the gabapentin seems to help some but made her sleepy so we will try Lyrica at a lower dose to see if she tolerates this well ?-Continue with cam boot for long distance and may tolerate crocs for shorter distances like before and advised patient to get a croc that is more supportive if her current pair are difficult to tolerate ?-Rx compression garments at [facility] for swelling at her right foot and ankle advised patient if pain worsens with compression to discontinue ?-Referral placed for patient to see neurology for what I suspect is chronic regional pain syndrome since her previous MRI was negative for any acute findings  ?-Previous blood work is negative for any acute findings except elevation in her vitamin B levels likely due to over supplementation and I advised her to speak to her PCP about discontinuing vitamin B supplements ?-Patient to return after neurology or sooner if problems or issues arise. ?  ?

## 2021-07-16 ENCOUNTER — Encounter: Payer: Self-pay | Admitting: Nurse Practitioner

## 2021-07-16 ENCOUNTER — Ambulatory Visit: Payer: Managed Care, Other (non HMO) | Admitting: Nurse Practitioner

## 2021-07-16 VITALS — BP 110/62 | HR 77 | Temp 98.6°F | Ht 63.0 in | Wt 221.0 lb

## 2021-07-16 DIAGNOSIS — Z6839 Body mass index (BMI) 39.0-39.9, adult: Secondary | ICD-10-CM

## 2021-07-16 DIAGNOSIS — R5383 Other fatigue: Secondary | ICD-10-CM

## 2021-07-16 DIAGNOSIS — R7309 Other abnormal glucose: Secondary | ICD-10-CM

## 2021-07-16 DIAGNOSIS — Z8639 Personal history of other endocrine, nutritional and metabolic disease: Secondary | ICD-10-CM

## 2021-07-16 DIAGNOSIS — E6609 Other obesity due to excess calories: Secondary | ICD-10-CM

## 2021-07-16 NOTE — Patient Instructions (Addendum)

## 2021-07-16 NOTE — Progress Notes (Signed)
I,Victoria T Hamilton,acting as a Education administrator for Minette Brine, FNP.,have documented all relevant documentation on the behalf of Minette Brine, FNP,as directed by  Minette Brine, FNP while in the presence of Minette Brine, [place].  This visit occurred during the SARS-CoV-2 public health emergency.  Safety protocols were in place, including screening questions prior to the visit, additional usage of staff PPE, and extensive cleaning of exam room while observing appropriate contact time as indicated for disinfecting solutions.  Subjective:     Patient ID: Krista Hogan , female    DOB: 1980-12-30 , 41 y.o.   MRN: [id number]   Chief Complaint  Patient presents with   Diabetes    HPI  Pt presents today for concerns with blood work after visiting foot doctor.podiatrist advised that she make this appointment.  She had labs done on 06/26/2021. Labs came back as her possibly being prediabetic.  She has been having problems moving her 5th metatarsal, she has been having swelling to her ankle. 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 She is being sent to a Neurologist and is treating with a nerve medication (was on gabapentin was making her too sleepy, she is now on pregalbamin). She did physical therapy with [facility]   She has not been to the office since 2021.  She was taking vitamin B12 to help increase her immune system she is also taking vitamin b complex to help with her HPV. She has also been taking biotin.    Wt Readings from Last 3 Encounters: 07/16/21 : 221 lb (100.2 kg) 08/16/20 : 216 lb 12.8 oz (98.3 kg) 08/02/20 : 222 lb (100.7 kg)      Past Medical History:  Diagnosis Date   DUB (dysfunctional uterine bleeding)    Graves disease 06/2016   History of abnormal cervical Pap smear    2011   History of HPV infection    Uterine fibroid    Wears glasses      Family History  Problem Relation Age of Onset   Fibroids Mother    Thyroid disease Neg Hx      Current Outpatient  Medications:    cetirizine (ZYRTEC) 10 MG tablet, Take 1 tablet (10 mg total) by mouth daily., Disp: 30 tablet, Rfl: 11   cholecalciferol (VITAMIN D) 1000 units tablet, Take 1,000 Units by mouth daily., Disp: , Rfl:    clindamycin-benzoyl peroxide (BENZACLIN) gel, Apply 1 application topically every morning., Disp: , Rfl:    cyanocobalamin 1000 MCG tablet, Take 1,000 mcg by mouth daily., Disp: , Rfl:    hydroquinone 4 % cream, Apply to face nightly to every other night with tretinoin, Disp: , Rfl:    ibuprofen (ADVIL) 800 MG tablet, TAKE 1 TABLET BY MOUTH EVERY 8 HOURS AS NEEDED, Disp: 30 tablet, Rfl: 5   norethindrone (MICRONOR) 0.35 MG tablet, Take 1 tablet by mouth daily., Disp: , Rfl:    norethindrone-ethinyl estradiol 1/35 (NORTREL 1/35, 28,) tablet, Take 1 tablet by mouth daily., Disp: 28 tablet, Rfl: 11   Omega-3 Fatty Acids (FISH OIL) 1000 MG CAPS, Take 1,000 mg by mouth daily. , Disp: , Rfl:    pregabalin (LYRICA) 25 MG capsule, Take 1 capsule (25 mg total) by mouth daily., Disp: 30 capsule, Rfl: 2   SPIRONOLACTONE PO, Take by mouth daily., Disp: , Rfl:    tretinoin (RETIN-A) 0.025 % cream, Apply to face nightly to tolerance, Disp: , Rfl:    acetaminophen (TYLENOL) 500 MG tablet,  Take 500 mg by mouth every 6 (six) hours as needed for moderate pain. (Patient not taking: Reported on 08/02/2020), Disp: , Rfl:    cyclobenzaprine (FLEXERIL) 10 MG tablet, Take 10 mg by mouth every 8 (eight) hours as needed. (Patient not taking: Reported on 07/16/2021), Disp: , Rfl:    fluocinonide ointment (LIDEX) 0.05 %, Apply to affected area on neck twice daily x 2 wk, then daily x 2 wk, then taper to tacrolimus (Patient not taking: Reported on 07/16/2021), Disp: , Rfl:    gabapentin (NEURONTIN) 100 MG capsule, Take 1 capsule (100 mg total) by mouth at bedtime. (Patient not taking: Reported on 07/16/2021), Disp: 90 capsule, Rfl: 3   HYDROcodone-acetaminophen (NORCO) 10-325 MG tablet, Take 1 tablet by mouth every 6  (six) hours as needed for up to 5 days., Disp: 20 tablet, Rfl: 0   neomycin-polymyxin-hydrocortisone (CORTISPORIN) OTIC solution, Apply 1-2 drops to toenail after soaking and then cover with bandaid (Patient not taking: Reported on 07/16/2021), Disp: 10 mL, Rfl: 0   PARoxetine (PAXIL) 20 MG tablet, Take 1 tablet (20 mg total) by mouth daily. (Patient not taking: Reported on 07/16/2021), Disp: 30 tablet, Rfl: 11   phentermine 15 MG capsule, Take 1 capsule (15 mg total) by mouth every morning., Disp: 30 capsule, Rfl: 1   predniSONE (STERAPRED UNI-PAK 21 TAB) 10 MG (21) TBPK tablet, Take as directed (Patient not taking: Reported on 07/16/2021), Disp: 21 tablet, Rfl: 0   Semaglutide-Weight Management (WEGOVY) 0.25 MG/0.5ML SOAJ, Inject 0.25 mg into the skin once a week., Disp: 2 mL, Rfl: 0   sertraline (ZOLOFT) 50 MG tablet, Take 50 mg by mouth daily. (Patient not taking: Reported on 07/16/2021), Disp: , Rfl:    tacrolimus (PROTOPIC) 0.1 % ointment, Apply to neck daily to twice daily as needed. (Patient not taking: Reported on 07/16/2021), Disp: , Rfl:    traMADol (ULTRAM) 50 MG tablet, TAKE 1 TABLET BY MOUTH EVERY 8 HOURS AS NEEDED FOR  UP  TO  5  DAYS (Patient not taking: Reported on 08/02/2020), Disp: 15 tablet, Rfl: 0   triamcinolone cream (KENALOG) 0.5 %, Apply topically. (Patient not taking: Reported on 08/02/2020), Disp: , Rfl:   Current Facility-Administered Medications:    triamcinolone acetonide (KENALOG) 10 MG/ML injection 10 mg, 10 mg, Other, Once, Cannon Kettle, [place], DPM   Allergies  Allergen Reactions   Minocycline Rash     Review of Systems  Constitutional: Negative.   Respiratory: Negative.    Cardiovascular: Negative.  Negative for chest pain, palpitations and leg swelling.  Neurological: Negative.   Psychiatric/Behavioral: Negative.      Today's Vitals   07/16/21 1519  BP: 110/62  Pulse: 77  Temp: 98.6 F (37 C)  SpO2: 98%  Weight: 221 lb (100.2 kg)  Height: '5\' 3"'$  (1.6 m)  PainSc:  0-No pain   Body mass index is 39.15 kg/m.  Wt Readings from Last 3 Encounters:  07/16/21 221 lb (100.2 kg)  08/16/20 216 lb 12.8 oz (98.3 kg)  08/02/20 222 lb (100.7 kg)    Objective:  Physical Exam Vitals reviewed.  Constitutional:      General: She is not in acute distress.    Appearance: Normal appearance.  Cardiovascular:     Rate and Rhythm: Normal rate and regular rhythm.     Pulses: Normal pulses.     Heart sounds: Normal heart sounds. No murmur heard. Skin:    Capillary Refill: Capillary refill takes less than 2 seconds.  Neurological:  General: No focal deficit present.     Mental Status: She is alert and oriented to person, place, and time.     Cranial Nerves: No cranial nerve deficit.     Motor: No weakness.  Psychiatric:        Mood and Affect: Mood normal.        Behavior: Behavior normal.        Thought Content: Thought content normal.        Judgment: Judgment normal.        Assessment And Plan:     1. Abnormal glucose Comments: HgbA1c was slightly elevated educated on prediabetes and risk associated. Encouraged to eat a low sugar and carbohydrate diet.  - Hemoglobin A1c - Insulin, random  2. Other fatigue Comments: Will check vitamin B12.   - Vitamin B12  3. Class 2 obesity due to excess calories without serious comorbidity with body mass index (BMI) of 39.0 to 39.9 in adult Comments: She has been struggling with her weight and would like to try a medication however Dr. Renne Crigler felt the medications may affect her Graves Dz She is encouraged to strive for BMI less than 30 to decrease cardiac risk. Advised to aim for at least 150 minutes of exercise per week. She would like to try weight loss medications  4. History of Graves' disease Comments: Being followed by Dr. Renne Crigler.     Patient was given opportunity to ask questions. Patient verbalized understanding of the plan and was able to repeat key elements of the plan. All questions were answered  to their satisfaction.  Minette Brine, FNP   I, Minette Brine, FNP, have reviewed all documentation for this visit. The documentation on 07/16/21 for the exam, diagnosis, procedures, and orders are all accurate and complete.   IF YOU HAVE BEEN REFERRED TO A SPECIALIST, IT MAY TAKE 1-2 WEEKS TO SCHEDULE/PROCESS THE REFERRAL. IF YOU HAVE NOT HEARD FROM US/SPECIALIST IN TWO WEEKS, PLEASE GIVE [place] A CALL AT [phone] X [phone].   THE PATIENT IS ENCOURAGED TO PRACTICE SOCIAL DISTANCING DUE TO THE COVID-19 PANDEMIC.

## 2021-07-17 LAB — HEMOGLOBIN A1C
Est. average glucose Bld gHb Est-mCnc: 117 mg/dL
Hgb A1c MFr Bld: 5.7 % — ABNORMAL HIGH (ref 4.8–5.6)

## 2021-07-17 LAB — VITAMIN B12: Vitamin B-12: 1962 pg/mL — ABNORMAL HIGH (ref 232–1245)

## 2021-07-17 LAB — INSULIN, RANDOM: INSULIN: 12.4 u[IU]/mL (ref 2.6–24.9)

## 2021-07-24 ENCOUNTER — Other Ambulatory Visit: Payer: Self-pay | Admitting: Sports Medicine

## 2021-07-24 ENCOUNTER — Telehealth: Payer: Self-pay | Admitting: Sports Medicine

## 2021-07-24 DIAGNOSIS — G90521 Complex regional pain syndrome I of right lower limb: Secondary | ICD-10-CM

## 2021-07-24 NOTE — Telephone Encounter (Signed)
Patient called and lvm stating that she has not heard from the Neurologist. Dr. Cannon Kettle did send over the referral but in the referral notes it stated that : We dont see this DX. Thanks for the referral. ? ?Please advise ?

## 2021-07-24 NOTE — Progress Notes (Signed)
Changed referral from neurology to physical medicine and rehab pain management since they do not treat chronic regional pain syndrome ?

## 2021-07-25 NOTE — Telephone Encounter (Signed)
Patient has been notified, verbalized understanding.

## 2021-07-29 ENCOUNTER — Encounter: Payer: Self-pay | Admitting: Nurse Practitioner

## 2021-07-29 ENCOUNTER — Other Ambulatory Visit: Payer: Self-pay | Admitting: Nurse Practitioner

## 2021-07-29 DIAGNOSIS — E6609 Other obesity due to excess calories: Secondary | ICD-10-CM

## 2021-07-29 MED ORDER — WEGOVY 0.25 MG/0.5ML ~~LOC~~ SOAJ: 0.2500 mg | SUBCUTANEOUS | 0 refills | Status: DC

## 2021-07-29 NOTE — Progress Notes (Signed)
Sent Rx for Wegovy.

## 2021-07-31 ENCOUNTER — Encounter: Payer: Self-pay | Admitting: Physical Medicine & Rehabilitation

## 2021-08-01 ENCOUNTER — Other Ambulatory Visit: Payer: Self-pay | Admitting: Nurse Practitioner

## 2021-08-01 MED ORDER — PHENTERMINE HCL 15 MG PO CAPS: 15.0000 mg | ORAL_CAPSULE | ORAL | 1 refills | Status: DC

## 2021-08-05 ENCOUNTER — Other Ambulatory Visit: Payer: Self-pay | Admitting: Sports Medicine

## 2021-08-05 MED ORDER — HYDROCODONE-ACETAMINOPHEN 10-325 MG PO TABS: 1.0000 | ORAL_TABLET | Freq: Four times a day (QID) | ORAL | 0 refills | Status: DC | PRN

## 2021-08-05 NOTE — Progress Notes (Signed)
Refilled pain meds 

## 2021-08-06 ENCOUNTER — Other Ambulatory Visit: Payer: Self-pay | Admitting: Sports Medicine

## 2021-08-06 ENCOUNTER — Telehealth: Payer: Self-pay | Admitting: Sports Medicine

## 2021-08-06 MED ORDER — HYDROCODONE-ACETAMINOPHEN 10-325 MG PO TABS: 1.0000 | ORAL_TABLET | Freq: Four times a day (QID) | ORAL | 0 refills | Status: AC | PRN

## 2021-08-06 NOTE — Progress Notes (Signed)
Resent pain medication

## 2021-08-06 NOTE — Telephone Encounter (Signed)
Pt called in, stated that her RX for her pain med has not been called in to her preferred pharmacy. She uses the [facility] on [address]. Please advise.

## 2021-08-15 ENCOUNTER — Ambulatory Visit: Payer: Managed Care, Other (non HMO) | Admitting: Internal Medicine

## 2021-08-15 ENCOUNTER — Encounter: Payer: Self-pay | Admitting: Internal Medicine

## 2021-08-15 VITALS — BP 120/74 | HR 76 | Ht 63.0 in | Wt 225.0 lb

## 2021-08-15 DIAGNOSIS — E0500 Thyrotoxicosis with diffuse goiter without thyrotoxic crisis or storm: Secondary | ICD-10-CM | POA: Diagnosis not present

## 2021-08-15 LAB — T3, FREE: T3, Free: 4.4 pg/mL — ABNORMAL HIGH (ref 2.3–4.2)

## 2021-08-15 LAB — T4, FREE: Free T4: 0.86 ng/dL (ref 0.60–1.60)

## 2021-08-15 LAB — TSH: TSH: 0.77 u[IU]/mL (ref 0.35–5.50)

## 2021-08-15 NOTE — Progress Notes (Signed)
Patient ID: Krista Hogan, female   DOB: 1980-06-09, 41 y.o.   MRN: [id number]   HPI  Krista Hogan is a 41 y.o.-year-old female, returning for f/u for Graves ds. She previously saw Dr. Loanne Hogan, last visit 11/2015. Last visit with me 1 year ago.  Interim hx: She has blurry vision - intermittently.  She saw ophthalmology this year.  She wears contacts.   She has heat intolerance - increased lately. She is on Phentermine, plans to start Krista Hogan. Since last visit, she contacted me with foot pain, wondering if this was related to her Graves' disease.  We discussed that this is unlikely but I did order TFTs for her.  She did not present to the lab. She does have a capsulitis in her foot and was in physical therapy.  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 She was Rx'ed MMI >> initially refused 2/2 concern for poss. weight gain   After I saw her, we checked labs which confirmed Graves' disease so he started the low-dose methimazole, 2.5 mg daily.  She initially developed headaches, but then she realized that this could have been due to allergies.  Due to the plans for pregnancy, we switched to a low-dose PTU, 50 mg daily.  We did discuss at that time that at the beginning of the second trimester, she will need to switch back to methimazole, but she did not contact me when she entered the second trimester, therefore, she continued PTU until set of labs in 09/2017.    Since the tests were excellent, we stopped PTU at that time.  She did well during the pregnancy but she was lost for follow-up between 10/2017-08/2018.  In 08/2018, labs were normal.    In 02/2019, TSH was slightly low and I advised her to restart methimazole 2.5 mg daily in 02/2019. She did not start as she forgot.  Afterwards, TFTs normalized so we did not have to restart methimazole.  She takes Biotin intermittently (B Complex) - last dose >1 week ago.  Review her TFTs: Lab Results  Component  Value Date   TSH 0.66 08/16/2020   TSH 1.15 08/22/2019   TSH 0.445 (L) 02/24/2019   TSH 0.44 08/23/2018   TSH 1.17 11/02/2017   TSH 1.28 10/07/2017   TSH 0.39 06/25/2017   TSH 0.50 01/01/2017   TSH 0.00 (L) 09/23/2016   TSH 0.01 (L) 10/20/2015   FREET4 0.87 08/16/2020   FREET4 0.83 08/22/2019   FREET4 1.24 02/24/2019   FREET4 0.83 08/23/2018   FREET4 0.65 11/02/2017   FREET4 0.62 10/07/2017   FREET4 0.72 06/25/2017   FREET4 0.73 01/01/2017   FREET4 1.05 09/23/2016   FREET4 2.0 (H) 10/20/2015  08/2016: TSH 0.011 (received after appt)  We diagnosed Graves' disease based on the disease course and elevated TSI antibodies.  These normalized at last check: Lab Results  Component Value Date   TSI 126 11/02/2017   TSI 273 (H) 09/23/2016   Pt denies: - feeling nodules in neck - hoarseness - choking - SOB with lying down But she has occasional dysphagia.  Pt does have a FH of thyroid ds. In cousin  No FH of thyroid cancer. No h/o radiation tx to head or neck.  + Biotin use. No recent steroids use.   Pt. also has a history of myomectomy.  She gave birth to a healthy baby in 12/2017.  ROS: + see HPI + resolved palpitations  I reviewed pt's medications, allergies, PMH, social  hx, family hx, and changes were documented in the history of present illness. Otherwise, unchanged from my initial visit note.  Past Medical History:  Diagnosis Date   DUB (dysfunctional uterine bleeding)    Graves disease 06/2016   History of abnormal cervical Pap smear    2011   History of HPV infection    Uterine fibroid    Wears glasses    Past Surgical History:  Procedure Laterality Date   CESAREAN SECTION N/A 01/06/2018   Procedure: CESAREAN SECTION;  Surgeon: Krista Macadam, MD;  Location: [facility];  Service: Obstetrics;  Laterality: N/A;   EXCISION OF SKIN TAG  11/22/2014   Procedure: EXCISION OF SKIN TAG VULVA;  Surgeon: Krista Specking, MD;  Location: [facility];  Service: Gynecology;;   LAPAROSCOPIC GELPORT ASSISTED MYOMECTOMY N/A 11/22/2014   Procedure: LAPAROSCOPIC GELPORT ASSISTED MYOMECTOMY WITH CHROMOTUBATION;  Surgeon: Krista Specking, MD;  Location: [facility];  Service: Gynecology;  Laterality: N/A;   LAPAROSCOPY N/A 11/22/2014   Procedure: LAPAROSCOPY DIAGNOSTIC;  Surgeon: Krista Specking, MD;  Location: [facility];  Service: Gynecology;  Laterality: N/A;   LEEP  2011   TONSILLECTOMY AND ADENOIDECTOMY  age 8   WISDOM TOOTH EXTRACTION  2006   Social History   Social History   Marital status: Single    Spouse name: N/A   Number of children: 0   Occupational History   Lab tech   Social History Main Topics   Smoking status: Current Some Day Smoker    Packs/day: 0.25    Years: 10.00    Types: Cigarettes   Smokeless tobacco: Never Used   Alcohol use 0.0 oz/week     Comment: SOCIAL   Drug use: No   Sexual activity: Yes    Birth control/ protection: None   Current Outpatient Medications on File Prior to Visit  Medication Sig Dispense Refill   acetaminophen (TYLENOL) 500 MG tablet Take 500 mg by mouth every 6 (six) hours as needed for moderate pain. (Patient not taking: Reported on 08/02/2020)     cetirizine (ZYRTEC) 10 MG tablet Take 1 tablet (10 mg total) by mouth daily. 30 tablet 11   cholecalciferol (VITAMIN D) 1000 units tablet Take 1,000 Units by mouth daily.     clindamycin-benzoyl peroxide (BENZACLIN) gel Apply 1 application topically every morning.     cyanocobalamin 1000 MCG tablet Take 1,000 mcg by mouth daily.     cyclobenzaprine (FLEXERIL) 10 MG tablet Take 10 mg by mouth every 8 (eight) hours as needed. (Patient not taking: Reported on 07/16/2021)     fluocinonide ointment (LIDEX) 0.05 % Apply to affected area on neck twice daily x 2 wk, then daily x 2 wk, then taper to tacrolimus (Patient not taking: Reported on 07/16/2021)     gabapentin (NEURONTIN) 100 MG capsule Take 1  capsule (100 mg total) by mouth at bedtime. (Patient not taking: Reported on 07/16/2021) 90 capsule 3   hydroquinone 4 % cream Apply to face nightly to every other night with tretinoin     ibuprofen (ADVIL) 800 MG tablet TAKE 1 TABLET BY MOUTH EVERY 8 HOURS AS NEEDED 30 tablet 5   neomycin-polymyxin-hydrocortisone (CORTISPORIN) OTIC solution Apply 1-2 drops to toenail after soaking and then cover with bandaid (Patient not taking: Reported on 07/16/2021) 10 mL 0   norethindrone (MICRONOR) 0.35 MG tablet Take 1 tablet by mouth daily.     norethindrone-ethinyl estradiol 1/35 (NORTREL 1/35, 28,) tablet Take 1  tablet by mouth daily. 28 tablet 11   Omega-3 Fatty Acids (FISH OIL) 1000 MG CAPS Take 1,000 mg by mouth daily.      PARoxetine (PAXIL) 20 MG tablet Take 1 tablet (20 mg total) by mouth daily. (Patient not taking: Reported on 07/16/2021) 30 tablet 11   phentermine 15 MG capsule Take 1 capsule (15 mg total) by mouth every morning. 30 capsule 1   predniSONE (STERAPRED UNI-PAK 21 TAB) 10 MG (21) TBPK tablet Take as directed (Patient not taking: Reported on 07/16/2021) 21 tablet 0   pregabalin (LYRICA) 25 MG capsule Take 1 capsule (25 mg total) by mouth daily. 30 capsule 2   Semaglutide-Weight Management (WEGOVY) 0.25 MG/0.5ML SOAJ Inject 0.25 mg into the skin once a week. 2 mL 0   sertraline (ZOLOFT) 50 MG tablet Take 50 mg by mouth daily. (Patient not taking: Reported on 07/16/2021)     SPIRONOLACTONE PO Take by mouth daily.     tacrolimus (PROTOPIC) 0.1 % ointment Apply to neck daily to twice daily as needed. (Patient not taking: Reported on 07/16/2021)     traMADol (ULTRAM) 50 MG tablet TAKE 1 TABLET BY MOUTH EVERY 8 HOURS AS NEEDED FOR  UP  TO  5  DAYS (Patient not taking: Reported on 08/02/2020) 15 tablet 0   tretinoin (RETIN-A) 0.025 % cream Apply to face nightly to tolerance     triamcinolone cream (KENALOG) 0.5 % Apply topically. (Patient not taking: Reported on 08/02/2020)     Current  Facility-Administered Medications on File Prior to Visit  Medication Dose Route Frequency Provider Last Rate Last Admin   triamcinolone acetonide (KENALOG) 10 MG/ML injection 10 mg  10 mg Other Once Landis Martins, DPM       Allergies  Allergen Reactions   Minocycline Rash   Family History  Problem Relation Age of Onset   Fibroids Mother    Thyroid disease Neg Hx    PE: BP 120/74 (BP Location: Right Arm, Patient Position: Sitting, Cuff Size: Normal)   Pulse 76   Ht '5\' 3"'$  (1.6 m)   Wt 225 lb (102.1 kg)   SpO2 99%   BMI 39.86 kg/m  Wt Readings from Last 3 Encounters:  08/15/21 225 lb (102.1 kg)  07/16/21 221 lb (100.2 kg)  08/16/20 216 lb 12.8 oz (98.3 kg)   Constitutional: overweight, in NAD Eyes: EOMI, no exophthalmos ENT: moist mucous membranes, + mild symmetric thyromegaly, no cervical lymphadenopathy Cardiovascular: RRR, No MRG Respiratory: CTA B Musculoskeletal: no deformities,  Skin: moist, warm, no rashes Neurological: no tremor with outstretched hands  ASSESSMENT: 1. Graves ds  PLAN:  1. Patient with history of Graves' disease, diagnosed based on the course of the disease and elevated TSI antibodies.  We skipped the thyroid uptake and scan for diagnosis. -We initially had her on methimazole and changed to PTU before her latest pregnancy.  She did not require medication during pregnancy.  She was then lost to follow-up between 2019 and 2020.  She had repeat labs in 02/2019 and I sent her a message through MyChart to restart low-dose methimazole, 2.5 mg daily, as the TSH was suppressed.  She did not see the message and returns to the clinic in 08/2019, still off methimazole.  TFTs checked were normal, so we ended up not starting methimazole. -At last visit, she only had occasional palpitations, which are chronic for her.  She was on a keto diet and lost approximately 5 pounds.  Her TFTs were normal. -At this  visit, she continues off methimazole. -No tremors,  unintentional weight loss, palpitations.  She has more heat intolerance, I wonder if this could be related to phentermine.  She mentions that she wanted to wait for this appointment to see if she can start [facility], which she has at home.  I advised that that some patients on Wegovy, may have a Graves' disease recurrence.  However, this is not a known side effect and I feel that this may be related to the stress of weight loss, rather than the medication itself.  Especially since she continues to gain weight (9 pounds in the last year), I recommended to start the [facility]. -No evidence of active Graves' ophthalmopathy: No double vision, eye pain, chemosis.  She has some blurry vision, possibly related to contacts or dry eyes.  She sees ophthalmology regularly. -At today's visit, we will repeat her TFTs.  Latest TSI's were normal.  We will not repeat them today -I will see her back in a year, but in 6 months for labs even if the labs are normal, as I anticipate weight loss after starting [facility].  Component     Latest Ref Rng 08/15/2021  TSH     0.35 - 5.50 uIU/mL 0.77   Triiodothyronine,Free,Serum     2.3 - 4.2 pg/mL 4.4 (H)   T4,Free(Direct)     0.60 - 1.60 ng/dL 0.86     TSH normal, free T4 also normal, free T3 slightly high.  No intervention needed for now.  We will recheck her TFTs in 6 months.  Philemon Kingdom, MD PhD [facility] Endocrinology

## 2021-08-15 NOTE — Patient Instructions (Addendum)
Please stop at the lab.  Please come back for a follow-up appointment in 1 year but for labs in 6 months.Marland Kitchen

## 2021-08-27 ENCOUNTER — Encounter: Payer: Self-pay | Admitting: Nurse Practitioner

## 2021-08-29 ENCOUNTER — Other Ambulatory Visit: Payer: Self-pay | Admitting: Nurse Practitioner

## 2021-08-29 DIAGNOSIS — E6609 Other obesity due to excess calories: Secondary | ICD-10-CM

## 2021-09-02 ENCOUNTER — Encounter
Payer: Managed Care, Other (non HMO) | Attending: Physical Medicine & Rehabilitation | Admitting: Physical Medicine & Rehabilitation

## 2021-09-05 ENCOUNTER — Encounter: Payer: Managed Care, Other (non HMO) | Admitting: Physical Medicine & Rehabilitation

## 2021-09-30 ENCOUNTER — Other Ambulatory Visit: Payer: Self-pay

## 2021-09-30 DIAGNOSIS — E6609 Other obesity due to excess calories: Secondary | ICD-10-CM

## 2021-09-30 MED ORDER — WEGOVY 0.5 MG/0.5ML ~~LOC~~ SOAJ: 0.5000 mg | SUBCUTANEOUS | 0 refills | Status: DC

## 2021-10-03 ENCOUNTER — Encounter: Payer: Self-pay | Admitting: Nurse Practitioner

## 2021-10-03 ENCOUNTER — Other Ambulatory Visit: Payer: Self-pay | Admitting: Nurse Practitioner

## 2021-10-03 ENCOUNTER — Other Ambulatory Visit: Payer: Self-pay

## 2021-10-03 DIAGNOSIS — E6609 Other obesity due to excess calories: Secondary | ICD-10-CM

## 2021-10-03 MED ORDER — PHENTERMINE HCL 15 MG PO CAPS: 15.0000 mg | ORAL_CAPSULE | ORAL | 1 refills | Status: DC

## 2021-10-09 ENCOUNTER — Encounter: Payer: Self-pay | Admitting: Podiatry

## 2021-10-09 ENCOUNTER — Ambulatory Visit: Payer: Managed Care, Other (non HMO) | Admitting: Podiatry

## 2021-10-09 DIAGNOSIS — M79671 Pain in right foot: Secondary | ICD-10-CM

## 2021-10-09 DIAGNOSIS — M792 Neuralgia and neuritis, unspecified: Secondary | ICD-10-CM | POA: Diagnosis not present

## 2021-10-09 DIAGNOSIS — G90521 Complex regional pain syndrome I of right lower limb: Secondary | ICD-10-CM | POA: Diagnosis not present

## 2021-10-09 MED ORDER — MELOXICAM 15 MG PO TABS: 15.0000 mg | ORAL_TABLET | Freq: Every day | ORAL | 0 refills | Status: DC

## 2021-10-09 NOTE — Progress Notes (Signed)
Subjective: Krista Hogan is a 41 y.o. female patient who presents to office for follow-up evaluation of right foot pain. Has been in the care of Dr. Cannon Kettle.  Relates she has continued to have pain in this right foot. She never followed up with pain management. She has had injecitons, PT and steroids.   Patient denies any significant redness warmth swelling or drainage or any other acute symptoms at this time.  Patient Active Problem List   Diagnosis Date Noted   Right lower quadrant pain 03/29/2020   Chronic idiopathic constipation 03/29/2020   Acute anal fissure 03/29/2020   Irritant dermatitis 03/21/2020   Itching of both hands 12/27/2019   Acne scarring 11/02/2019   Acne vulgaris 11/02/2019   Hyperpigmentation 11/02/2019   Gastroesophageal reflux disease without esophagitis 08/29/2019   Globus pharyngeus 08/29/2019   Nasal turbinate hypertrophy 08/29/2019   Rhinitis, chronic 08/29/2019   Throat irritation 08/29/2019   Palpitations 02/25/2019   Dysesthesia 02/01/2019   Groin pain 02/01/2019   Obesity 02/01/2019   Postpartum hypertension 01/10/2018   S/P repeat low transverse C-section 01/06/2018   Supervision of other normal pregnancy, antepartum 07/08/2017   Graves disease 10/01/2016   Genital herpes simplex 07/29/2016   Cyst of ovary 07/28/2016   Genital warts 07/28/2016   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 (Patient not taking: Reported on 08/02/2020)     cetirizine (ZYRTEC) 10 MG tablet Take 1 tablet (10 mg total) by mouth daily. 30 tablet 11    cholecalciferol (VITAMIN D) 1000 units tablet Take 1,000 Units by mouth daily.     clindamycin-benzoyl peroxide (BENZACLIN) gel Apply 1 application topically every morning.     cyanocobalamin 1000 MCG tablet Take 1,000 mcg by mouth daily.     cyclobenzaprine (FLEXERIL) 10 MG tablet Take 10 mg by mouth every 8 (eight) hours as needed. (Patient not taking: Reported on 07/16/2021)     fluocinonide ointment (LIDEX) 0.05 % Apply to affected area on neck twice daily x 2 wk, then daily x 2 wk, then taper to tacrolimus (Patient not taking: Reported on 07/16/2021)     gabapentin (NEURONTIN) 100 MG capsule Take 1 capsule (100 mg total) by mouth at bedtime. (Patient not taking: Reported on 07/16/2021) 90 capsule 3   hydroquinone 4 % cream Apply to face nightly to every other night with tretinoin     ibuprofen (ADVIL) 800 MG tablet TAKE 1 TABLET BY MOUTH EVERY 8 HOURS AS NEEDED 30 tablet 5   neomycin-polymyxin-hydrocortisone (CORTISPORIN) OTIC solution Apply 1-2 drops to toenail after soaking and then cover with bandaid (Patient not taking: Reported on 07/16/2021) 10 mL 0   norethindrone (MICRONOR) 0.35 MG tablet Take 1 tablet by mouth daily.     norethindrone-ethinyl estradiol 1/35 (NORTREL 1/35, 28,) tablet Take 1 tablet by mouth daily. 28 tablet 11   Omega-3 Fatty Acids (FISH OIL) 1000 MG CAPS Take 1,000 mg by mouth daily.      PARoxetine (PAXIL) 20 MG tablet Take 1 tablet (20 mg total) by mouth daily. (Patient not taking: Reported on 07/16/2021) 30 tablet 11  phentermine 15 MG capsule Take 1 capsule (15 mg total) by mouth every morning. 30 capsule 1   predniSONE (STERAPRED UNI-PAK 21 TAB) 10 MG (21) TBPK tablet Take as directed (Patient not taking: Reported on 07/16/2021) 21 tablet 0   pregabalin (LYRICA) 25 MG capsule Take 1 capsule (25 mg total) by mouth daily. 30 capsule 2   Semaglutide-Weight Management (WEGOVY) 0.5 MG/0.5ML SOAJ Inject 0.5 mg into the skin once a week. 2 mL 0   sertraline (ZOLOFT) 50 MG tablet  Take 50 mg by mouth daily. (Patient not taking: Reported on 07/16/2021)     SPIRONOLACTONE PO Take by mouth daily.     tacrolimus (PROTOPIC) 0.1 % ointment Apply to neck daily to twice daily as needed. (Patient not taking: Reported on 07/16/2021)     traMADol (ULTRAM) 50 MG tablet TAKE 1 TABLET BY MOUTH EVERY 8 HOURS AS NEEDED FOR  UP  TO  5  DAYS (Patient not taking: Reported on 08/02/2020) 15 tablet 0   tretinoin (RETIN-A) 0.025 % cream Apply to face nightly to tolerance     triamcinolone cream (KENALOG) 0.5 % Apply topically. (Patient not taking: Reported on 08/02/2020)     Current Facility-Administered Medications on File Prior to Visit  Medication Dose Route Frequency Provider Last Rate Last Admin   triamcinolone acetonide (KENALOG) 10 MG/ML injection 10 mg  10 mg Other Once Landis Martins, DPM        Allergies  Allergen Reactions   Minocycline Rash    Objective:  General: Alert and oriented x3 in no acute distress  Dermatology: No open lesions bilateral lower extremities, no webspace macerations, no ecchymosis bilateral, all nails x 10 are well manicured except with dry skin noted at the left hallux lateral nail fold.  Vascular: Dorsalis Pedis and Posterior Tibial pedal pulses palpable, Capillary Fill Time 3 seconds,(+) pedal hair growth bilateral, minimal localized edema to the right lateral foot and ankle, temperature gradient within normal limits.  Neurology: Gross sensation intact via light touch bilateral.  Minimal sharp shooting pain over the right lateral foot and ankle and lower leg.  Gabapentin has helped this but per patient it makes her feel sleepy and wants a medication change.  Musculoskeletal: Mild to moderate tenderness over the dorsal lateral fourth and fifth metatarsal bases extending to the lateral ankle and to the sinus tarsi area of the right foot which is unchanged from prior.  There is no frank ankle instability noted.  There is localized swelling without warmth to  this area mild guarding due to pain unchanged from prior.  Pes planus foot type.   Assessment and Plan: Problem List Items Addressed This Visit   None Visit Diagnoses     Complex regional pain syndrome type 1 of right lower extremity    -  Primary   Neuritis       Right foot pain             -Complete examination performed -Re- discussed treatment options for chronic ongoing right foot and ankle pain -Continue with Lyrica  -Continue with cam boot for long distance and may tolerate crocs for shorter distances like before and advised patient to get a croc that is more supportive if her current pair are difficult to tolerate -Continue with compression stocking as tolerated.   -Referral placed again for physical medicine and rehabilitation for what I suspect is chronic regional pain syndrome since her previous MRI was negative for any acute findings  -Meloxicam sent to pharmacy  -  Patient to return as needed.

## 2021-10-14 LAB — HM MAMMOGRAPHY: HM Mammogram: NORMAL (ref 0–4)

## 2021-10-16 ENCOUNTER — Ambulatory Visit: Payer: Managed Care, Other (non HMO) | Admitting: Nurse Practitioner

## 2021-10-16 ENCOUNTER — Encounter: Payer: Self-pay | Admitting: Nurse Practitioner

## 2021-10-16 VITALS — BP 128/62 | HR 81 | Temp 98.2°F | Ht 63.0 in | Wt 219.0 lb

## 2021-10-16 DIAGNOSIS — Z6838 Body mass index (BMI) 38.0-38.9, adult: Secondary | ICD-10-CM | POA: Diagnosis not present

## 2021-10-16 DIAGNOSIS — R7309 Other abnormal glucose: Secondary | ICD-10-CM | POA: Diagnosis not present

## 2021-10-16 DIAGNOSIS — E6609 Other obesity due to excess calories: Secondary | ICD-10-CM

## 2021-10-16 MED ORDER — PHENTERMINE HCL 37.5 MG PO CAPS: 37.5000 mg | ORAL_CAPSULE | ORAL | 1 refills | Status: DC

## 2021-10-16 NOTE — Progress Notes (Signed)
Subjective:     Patient ID: Krista Hogan , female    DOB: May 12, 1980 , 41 y.o.   MRN: [id number]   Chief Complaint  Patient presents with   Weight Check    HPI  Patient presents today for weight check. She is currently taking phentermine 15 mg daily, she is eating regularly. She can tell a difference between the wegovy and phentermine. She is exercising 2 times a week due to continuing to have pain to her foot. She is going to have to go to a pain clinic for her foot pain. She had been walking at [facility] and had pain after she returned home, this was a year ago. She has done PT and water therapy. She can only wear crocs for her foot.   Wt Readings from Last 3 Encounters: 10/16/21 : 219 lb (99.3 kg) 08/15/21 : 225 lb (102.1 kg) 07/16/21 : 221 lb (100.2 kg)         Past Medical History:  Diagnosis Date   DUB (dysfunctional uterine bleeding)    Graves disease 06/2016   History of abnormal cervical Pap smear    2011   History of HPV infection    Uterine fibroid    Wears glasses      Family History  Problem Relation Age of Onset   Fibroids Mother    Thyroid disease Neg Hx      Current Outpatient Medications:    cetirizine (ZYRTEC) 10 MG tablet, Take 1 tablet (10 mg total) by mouth daily., Disp: 30 tablet, Rfl: 11   cholecalciferol (VITAMIN D) 1000 units tablet, Take 1,000 Units by mouth daily., Disp: , Rfl:    gabapentin (NEURONTIN) 100 MG capsule, Take 1 capsule (100 mg total) by mouth at bedtime., Disp: 90 capsule, Rfl: 3   ibuprofen (ADVIL) 800 MG tablet, TAKE 1 TABLET BY MOUTH EVERY 8 HOURS AS NEEDED, Disp: 30 tablet, Rfl: 5   meloxicam (MOBIC) 15 MG tablet, Take 1 tablet (15 mg total) by mouth daily., Disp: 30 tablet, Rfl: 0   norethindrone (MICRONOR) 0.35 MG tablet, Take 1 tablet by mouth daily., Disp: , Rfl:    norethindrone-ethinyl estradiol 1/35 (NORTREL 1/35, 28,) tablet, Take 1 tablet by mouth daily., Disp: 28 tablet, Rfl: 11   Omega-3 Fatty Acids (FISH  OIL) 1000 MG CAPS, Take 1,000 mg by mouth daily. , Disp: , Rfl:    phentermine 37.5 MG capsule, Take 1 capsule (37.5 mg total) by mouth every morning., Disp: 30 capsule, Rfl: 1   SPIRONOLACTONE PO, Take by mouth daily., Disp: , Rfl:    tretinoin (RETIN-A) 0.025 % cream, Apply to face nightly to tolerance, Disp: , Rfl:    fluocinonide ointment (LIDEX) 0.05 %, Apply to affected area on neck twice daily x 2 wk, then daily x 2 wk, then taper to tacrolimus (Patient not taking: Reported on 07/16/2021), Disp: , Rfl:   Current Facility-Administered Medications:    triamcinolone acetonide (KENALOG) 10 MG/ML injection 10 mg, 10 mg, Other, Once, Cannon Kettle, [place], DPM   Allergies  Allergen Reactions   Minocycline Rash     Review of Systems  Constitutional: Negative.   Respiratory: Negative.    Cardiovascular: Negative.   Gastrointestinal: Negative.   Skin:        She is having flaking to her scalp.   Neurological: Negative.      Today's Vitals   10/16/21 1611  BP: 128/62  Pulse: 81  Temp: 98.2 F (36.8 C)  TempSrc: Oral  Weight: 219 lb (99.3 kg)  Height: '5\' 3"'$  (1.6 m)   Body mass index is 38.79 kg/m.  Wt Readings from Last 3 Encounters:  10/16/21 219 lb (99.3 kg)  08/15/21 225 lb (102.1 kg)  07/16/21 221 lb (100.2 kg)    Objective:  Physical Exam Vitals reviewed.  Constitutional:      General: She is not in acute distress.    Appearance: Normal appearance.  Cardiovascular:     Rate and Rhythm: Normal rate and regular rhythm.     Pulses: Normal pulses.     Heart sounds: Normal heart sounds. No murmur heard. Pulmonary:     Effort: Pulmonary effort is normal. No respiratory distress.     Breath sounds: Normal breath sounds. No wheezing.  Skin:    General: Skin is warm and dry.     Capillary Refill: Capillary refill takes less than 2 seconds.     Comments: No abnormal findings to scalp  Neurological:     General: No focal deficit present.     Mental Status: She is alert and  oriented to person, place, and time.     Cranial Nerves: No cranial nerve deficit.     Motor: No weakness.  Psychiatric:        Mood and Affect: Mood normal.        Behavior: Behavior normal.        Thought Content: Thought content normal.        Judgment: Judgment normal.         Assessment And Plan:     1. Abnormal glucose Comments: Stable, continue diet control  2. Class 2 obesity due to excess calories without serious comorbidity with body mass index (BMI) of 38.0 to 38.9 in adult Comments: Will increase phentermine, she is doing well. When Mancel Parsons is more available will consider referral.  - phentermine 37.5 MG capsule; Take 1 capsule (37.5 mg total) by mouth every morning.  Dispense: 30 capsule; Refill: 1     Patient was given opportunity to ask questions. Patient verbalized understanding of the plan and was able to repeat key elements of the plan. All questions were answered to their satisfaction.  Minette Brine, FNP   I, Minette Brine, FNP, have reviewed all documentation for this visit. The documentation on 10/16/21 for the exam, diagnosis, procedures, and orders are all accurate and complete.   IF YOU HAVE BEEN REFERRED TO A SPECIALIST, IT MAY TAKE 1-2 WEEKS TO SCHEDULE/PROCESS THE REFERRAL. IF YOU HAVE NOT HEARD FROM US/SPECIALIST IN TWO WEEKS, PLEASE GIVE [place] A CALL AT [phone].   THE PATIENT IS ENCOURAGED TO PRACTICE SOCIAL DISTANCING DUE TO THE COVID-19 PANDEMIC.

## 2021-10-16 NOTE — Patient Instructions (Signed)
Obesity, Adult ?Obesity is having too much body fat. Being obese means that your weight is more than what is healthy for you.  ?BMI (body mass index) is a number that explains how much body fat you have. If you have a BMI of 30 or more, you are obese. ?Obesity can cause serious health problems, such as: ?Stroke. ?Coronary artery disease (CAD). ?Type 2 diabetes. ?Some types of cancer. ?High blood pressure (hypertension). ?High cholesterol. ?Gallbladder stones. ?Obesity can also contribute to: ?Osteoarthritis. ?Sleep apnea. ?Infertility problems. ?What are the causes? ?Eating meals each day that are high in calories, sugar, and fat. ?Drinking a lot of drinks that have sugar in them. ?Being born with genes that may make you more likely to become obese. ?Having a medical condition that causes obesity. ?Taking certain medicines. ?Sitting a lot (having a sedentary lifestyle). ?Not getting enough sleep. ?What increases the risk? ?Having a family history of obesity. ?Living in an area with limited access to: ?Parks, recreation centers, or sidewalks. ?Healthy food choices, such as grocery stores and farmers' markets. ?What are the signs or symptoms? ?The main sign is having too much body fat. ?How is this treated? ?Treatment for this condition often includes changing your lifestyle. Treatment may include: ?Changing your diet. This may include making a healthy meal plan. ?Exercise. This may include activity that causes your heart to beat faster (aerobic exercise) and strength training. Work with your doctor to design a program that works for you. ?Medicine to help you lose weight. This may be used if you are not able to lose one pound a week after 6 weeks of healthy eating and more exercise. ?Treating conditions that cause the obesity. ?Surgery. Options may include gastric banding and gastric bypass. This may be done if: ?Other treatments have not helped to improve your condition. ?You have a BMI of 40 or higher. ?You have  life-threatening health problems related to obesity. ?Follow these instructions at home: ?Eating and drinking ? ?Follow advice from your doctor about what to eat and drink. Your doctor may tell you to: ?Limit fast food, sweets, and processed snack foods. ?Choose low-fat options. For example, choose low-fat milk instead of whole milk. ?Eat five or more servings of fruits or vegetables each day. ?Eat at home more often. This gives you more control over what you eat. ?Choose healthy foods when you eat out. ?Learn to read food labels. This will help you learn how much food is in one serving. ?Keep low-fat snacks available. ?Avoid drinks that have a lot of sugar in them. These include soda, fruit juice, iced tea with sugar, and flavored milk. ?Drink enough water to keep your pee (urine) pale yellow. ?Do not go on fad diets. ?Physical activity ?Exercise often, as told by your doctor. Most adults should get up to 150 minutes of moderate-intensity exercise every week.Ask your doctor: ?What types of exercise are safe for you. ?How often you should exercise. ?Warm up and stretch before being active. ?Do slow stretching after being active (cool down). ?Rest between times of being active. ?Lifestyle ?Work with your doctor and a food expert (dietitian) to set a weight-loss goal that is best for you. ?Limit your screen time. ?Find ways to reward yourself that do not involve food. ?Do not drink alcohol if: ?Your doctor tells you not to drink. ?You are pregnant, may be pregnant, or are planning to become pregnant. ?If you drink alcohol: ?Limit how much you have to: ?0-1 drink a day for women. ?0-2 drinks   a day for men. ?Know how much alcohol is in your drink. In the [place] one drink equals one 12 oz bottle of beer (355 mL), one 5 oz glass of wine (148 mL), or one 1? oz glass of hard liquor (44 mL). ?General instructions ?Keep a weight-loss journal. This can help you keep track of: ?The food that you eat. ?How much exercise you  get. ?Take over-the-counter and prescription medicines only as told by your doctor. ?Take vitamins and supplements only as told by your doctor. ?Think about joining a support group. ?Pay attention to your mental health as obesity can lead to depression or self esteem issues. ?Keep all follow-up visits. ?Contact a doctor if: ?You cannot meet your weight-loss goal after you have changed your diet and lifestyle for 6 weeks. ?You are having trouble breathing. ?Summary ?Obesity is having too much body fat. ?Being obese means that your weight is more than what is healthy for you. ?Work with your doctor to set a weight-loss goal. ?Get regular exercise as told by your doctor. ?This information is not intended to replace advice given to you by your health care provider. Make sure you discuss any questions you have with your health care provider. ?Document Revised: 10/02/2020 Document Reviewed: 10/02/2020 ?Elsevier Patient Education ? 2023 [employer] Inc. ? ?

## 2021-10-25 ENCOUNTER — Encounter: Payer: Self-pay | Admitting: Nurse Practitioner

## 2021-11-13 ENCOUNTER — Encounter: Payer: Self-pay | Admitting: Internal Medicine

## 2021-11-13 ENCOUNTER — Encounter: Payer: Self-pay | Admitting: Nurse Practitioner

## 2021-11-14 ENCOUNTER — Other Ambulatory Visit: Payer: Self-pay | Admitting: Internal Medicine

## 2021-11-14 DIAGNOSIS — E0500 Thyrotoxicosis with diffuse goiter without thyrotoxic crisis or storm: Secondary | ICD-10-CM

## 2021-11-18 ENCOUNTER — Encounter: Payer: Self-pay | Admitting: Nurse Practitioner

## 2021-11-18 ENCOUNTER — Other Ambulatory Visit: Payer: Self-pay | Admitting: Nurse Practitioner

## 2021-11-18 DIAGNOSIS — Z6838 Body mass index (BMI) 38.0-38.9, adult: Secondary | ICD-10-CM

## 2021-11-18 MED ORDER — SAXENDA 18 MG/3ML ~~LOC~~ SOPN: 3.0000 mg | PEN_INJECTOR | Freq: Every day | SUBCUTANEOUS | 1 refills | Status: DC

## 2021-12-04 ENCOUNTER — Encounter (INDEPENDENT_AMBULATORY_CARE_PROVIDER_SITE_OTHER): Payer: Self-pay | Admitting: Internal Medicine

## 2021-12-04 ENCOUNTER — Ambulatory Visit (INDEPENDENT_AMBULATORY_CARE_PROVIDER_SITE_OTHER): Payer: Managed Care, Other (non HMO) | Admitting: Internal Medicine

## 2021-12-04 VITALS — BP 104/72 | HR 71 | Temp 98.4°F | Ht 63.0 in | Wt 213.0 lb

## 2021-12-04 DIAGNOSIS — E669 Obesity, unspecified: Secondary | ICD-10-CM | POA: Diagnosis not present

## 2021-12-04 DIAGNOSIS — R5383 Other fatigue: Secondary | ICD-10-CM | POA: Diagnosis not present

## 2021-12-04 DIAGNOSIS — Z0289 Encounter for other administrative examinations: Secondary | ICD-10-CM

## 2021-12-04 DIAGNOSIS — Z6837 Body mass index (BMI) 37.0-37.9, adult: Secondary | ICD-10-CM

## 2021-12-04 NOTE — Progress Notes (Signed)
Office: [phone]  /  Fax: [phone]  Initial Visit  Krista Hogan was seen in clinic today to evaluate for obesity. She is interested in losing weight to improve overall health and reduce the risk of weight related complications. Referred by PCP. 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  Has had problems with weight control since childhood. Had been on Wegovy and now on phentermine. Reports increased sweating and feeling hot. Denies changes in appetite or weight loss. Not following a nutritional plan or physical activity. Peak weight 230 lbs.  She presents today to review program treatment options, initial physical assessment, and evaluation.      Past medical history includes:   Past Medical History:  Diagnosis Date   DUB (dysfunctional uterine bleeding)    Graves disease 06/2016   History of abnormal cervical Pap smear    2011   History of HPV infection    Uterine fibroid    Wears glasses      Objective:   BP 104/72   Pulse 71   Temp 98.4 F (36.9 C)   Ht '5\' 3"'$  (1.6 m)   Wt 213 lb (96.6 kg)   SpO2 93%   BMI 37.73 kg/m  She was weighed on the bioimpedance scale:  Body mass index is 37.73 kg/m.  General:  Alert, oriented and cooperative. Patient is in no acute distress.  Respiratory: Normal respiratory effort, no problems with respiration noted  Extremities: Normal range of motion.    Mental Status: Normal mood and affect. Normal behavior. Normal judgment and thought content.   Assessment and Plan:  1. Class 2 obesity without serious comorbidity with body mass index (BMI) of 37.0 to 37.9 in adult, unspecified obesity type  2. Other fatigue - EKG 12-Lead      Obesity Treatment Plan:  She will work on garnering support from family and friends to begin weight loss journey. Work on eliminating or reducing the presence of highly processed, calorie dense foods in the home. Complete provided nutritional and psychosocial assessment questionnaire.     Krista Hogan will follow up in the next 1-2 weeks to review the above steps and continue evaluation and treatment. Patient advised to review discontinuation of phentermine with prescribing physician due to SE and lack of therapeutic benefit per patient.    Obesity Education Performed Today:  She was weighed on the bioimpedance scale and results were discussed and documented in the synopsis.  We discussed obesity as a disease and the importance of a more detailed evaluation of all the factors contributing to the disease.  We discussed the importance of long term lifestyle changes which include nutrition, exercise and behavioral modifications as well as the importance of customizing this to her specific health and social needs.  We discussed the benefits of reaching a healthier weight to alleviate the symptoms of existing conditions and reduce the risks of the biomechanical, metabolic and psychological effects of obesity.  We discussed the goals of this program is to improve her overall health and not simply achieve a specific BMI.  Frequent visits are very important to patient success. I plan to see her every 2 weeks for the first 3 months and then evaluate the visit frequency after that time. I explained obesity is a life-long chronic disease and long term treatments would be required. Medications to help her follow his eating plan may be offered as appropriate but are not required. All medication decisions will be made together after the initial  workup is done and benefits and side effects are discussed in depth.  The clinic rules were reviewed including the late policy, cancellation policy, no show and program fees.  Krista Hogan appears to be in the action stage of change and states they are ready to start intensive lifestyle modifications and behavioral modifications.  30 minutes was spent today on this visit including the above counseling, pre-visit chart review, and post-visit  documentation.  Thomes Dinning, MD

## 2021-12-09 ENCOUNTER — Encounter
Payer: Managed Care, Other (non HMO) | Attending: Physical Medicine & Rehabilitation | Admitting: Physical Medicine & Rehabilitation

## 2021-12-09 DIAGNOSIS — M79671 Pain in right foot: Secondary | ICD-10-CM | POA: Insufficient documentation

## 2021-12-09 DIAGNOSIS — G90521 Complex regional pain syndrome I of right lower limb: Secondary | ICD-10-CM | POA: Insufficient documentation

## 2021-12-11 ENCOUNTER — Ambulatory Visit (INDEPENDENT_AMBULATORY_CARE_PROVIDER_SITE_OTHER): Payer: Managed Care, Other (non HMO) | Admitting: Internal Medicine

## 2021-12-12 ENCOUNTER — Encounter: Payer: Managed Care, Other (non HMO) | Admitting: Physical Medicine & Rehabilitation

## 2021-12-12 ENCOUNTER — Encounter: Payer: Self-pay | Admitting: Physical Medicine & Rehabilitation

## 2021-12-12 VITALS — BP 108/77 | HR 78 | Ht 63.0 in | Wt 215.0 lb

## 2021-12-12 DIAGNOSIS — M79671 Pain in right foot: Secondary | ICD-10-CM

## 2021-12-12 DIAGNOSIS — G90521 Complex regional pain syndrome I of right lower limb: Secondary | ICD-10-CM

## 2021-12-12 MED ORDER — DULOXETINE HCL 30 MG PO CPEP: 30.0000 mg | ORAL_CAPSULE | Freq: Every day | ORAL | 2 refills | Status: DC

## 2021-12-12 NOTE — Progress Notes (Signed)
Subjective:    Patient ID: Krista Hogan, female    DOB: [birth year], 41 y.o.   MRN: [id number]  HPI Ms. Krista Hogan is a 41 year old female with past medical history of Graves' disease, obesity who is here for pain in her right foot.  Patient reports she has constant stabbing and aching pain in her lateral right foot.  She reports this began after she was walking a lot at [employer] about a year ago.  She thinks the shoes she was wearing were not fitting well at the time.  Initially both feet are painful however the left foot improved and the right foot continues to hurt.  Pain is worsened with ambulation.  She reports that she had significant swelling in the past in her lateral foot however this is improving.  She has not noticed any unusual hair changes, redness or nail growth over her foot.  She also reports occasional muscle spasms when her fifth digit will sometimes twitch.  Patient has been followed patient has been treated by podiatry and had an MRI 02/14/2021 that did not identify the cause of the pain.  Patient reports she has tried water aerobics and land based physical therapy.  She says that she thought the water therapy was more beneficial than the land-based therapy.  Dr. Dr. Cannon Kettle podiatry is expecting complex regional pain syndrome.  She also had oral steroids, multiple injections into her foot without benefit.  She thinks that he does provide some benefit to her pain.  Tylenol and ibuprofen do not help.  Gabapentin and Lyrica provided some benefit but caused her too much sedation.  Patient reports she also had a history of pain in her left thigh that occurred when she was pregnant several years ago.  She reports using a cane for ambulation at the time.  She reports she was told this pain was sciatica.  It appears that she was seen by neurology in 2020 and the pain was noted to be along the location of the femoral branch of the genitofemoral nerve.  She reports this pain has improved  since this time.   Pain Inventory Average Pain 10 Pain Right Now 8 My pain is constant, sharp, stabbing, tingling, and aching  In the last 24 hours, has pain interfered with the following? General activity 10 Relation with othe rs 10 Enjoyment of life 10 What TIME of day is your pain at its worst? daytime, evening, and night Sleep (in general) Poor  Pain is worse with: walking, standing, and some activites Pain improves with: rest, heat/ice, medication, and TENS Relief from Meds: 3  walk without assistance how many minutes can you walk? 20 steos before pain starts ability to climb steps?  yes do you drive?  yes Do you have any goals in this area?  yes  employed # of hrs/week 40 hours per week  tremor tingling trouble walking spasms  Any changes since last visit?  no  Any changes since last visit?  no    Family History  Problem Relation Age of Onset   Fibroids Mother    Thyroid disease Neg Hx    Social History   Socioeconomic History   Marital status: Single    Spouse name: Not on file   Number of children: Not on file   Years of education: Not on file   Highest education level: Not on file  Occupational History   Not on file  Tobacco Use   Smoking status: Every Day  Packs/day: 0.25    Years: 10.00    Total pack years: 2.50    Types: Cigarettes    Last attempt to quit: 04/2017    Years since quitting: 4.6   Smokeless tobacco: Never  Vaping Use   Vaping Use: Never used  Substance and Sexual Activity   Alcohol use: Yes    Alcohol/week: 0.0 standard drinks of alcohol    Comment: SOCIAL   Drug use: No   Sexual activity: Yes    Partners: Male    Birth control/protection: Pill, None  Other Topics Concern   Not on file  Social History Narrative   Not on file   Social Determinants of Health   Financial Resource Strain: Low Risk  (12/22/2017)   Overall Financial Resource Strain (CARDIA)    Difficulty of Paying Living Expenses: Not hard at all   Food Insecurity: No Food Insecurity (12/22/2017)   Hunger Vital Sign    Worried About Running Out of Food in the Last Year: Never true    Ran Out of Food in the Last Year: Never true  Transportation Needs: Unknown (12/22/2017)   PRAPARE - Hydrologist (Medical): No    Lack of Transportation (Non-Medical): Not on file  Physical Activity: Not on file  Stress: Stress Concern Present (12/22/2017)   Keys    Feeling of Stress : To some extent  Social Connections: Not on file   Past Surgical History:  Procedure Laterality Date   CESAREAN SECTION N/A 01/06/2018   Procedure: CESAREAN SECTION;  Surgeon: Caren Macadam, MD;  Location: [facility];  Service: Obstetrics;  Laterality: N/A;   EXCISION OF SKIN TAG  11/22/2014   Procedure: EXCISION OF SKIN TAG VULVA;  Surgeon: Governor Specking, MD;  Location: [facility];  Service: Gynecology;;   LAPAROSCOPIC GELPORT ASSISTED MYOMECTOMY N/A 11/22/2014   Procedure: LAPAROSCOPIC GELPORT ASSISTED MYOMECTOMY WITH CHROMOTUBATION;  Surgeon: Governor Specking, MD;  Location: [facility];  Service: Gynecology;  Laterality: N/A;   LAPAROSCOPY N/A 11/22/2014   Procedure: LAPAROSCOPY DIAGNOSTIC;  Surgeon: Governor Specking, MD;  Location: [facility];  Service: Gynecology;  Laterality: N/A;   LEEP  2011   TONSILLECTOMY AND ADENOIDECTOMY  age 34   WISDOM TOOTH EXTRACTION  2006   Past Medical History:  Diagnosis Date   DUB (dysfunctional uterine bleeding)    Graves disease 06/2016   History of abnormal cervical Pap smear    2011   History of HPV infection    Uterine fibroid    Wears glasses    Ht '5\' 3"'$  (1.6 m)   Wt 215 lb (97.5 kg)   BMI 38.09 kg/m   Opioid Risk Score:   Fall Risk Score:  `1  Depression screen [facility] 2/9     12/12/2021   10:53 AM 10/16/2021    4:07 PM 07/16/2021    3:19 PM 07/12/2019     4:21 PM 02/24/2019    3:59 PM 10/20/2015    8:35 AM  Depression screen [facility] 2/9  Decreased Interest 0 0 0 0 0 0  Down, Depressed, Hopeless 0 0 0 0 0 0  PHQ - 2 Score 0 0 0 0 0 0  Altered sleeping 0       Tired, decreased energy 1       Change in appetite 2       Feeling bad or failure about yourself  0  Trouble concentrating 0       Moving slowly or fidgety/restless 0       Suicidal thoughts 0       PHQ-9 Score 3         Review of Systems  Musculoskeletal:  Positive for gait problem.  Neurological:  Positive for tremors.       Tingling, spasms  All other systems reviewed and are negative.      Objective:   Physical Exam   Gen: no distress, normal appearing HEENT: oral mucosa pink and moist, NCAT Cardio: Reg rate Chest: normal effort, normal rate of breathing Abd: soft, non-distended Ext: no edema, peripheral pulses intact Psych: pleasant, normal affect Skin: intact Neuro: CN grossly intact, follows commands, normal insight and judgement Sensation intact to LT in all 4 extremities and throughout her bilateral feet Strength 5/5 in all 4 other than 4 out of 5 right ankle PF and DF limited by pain Finger-nose intact bilaterally, heel-to-shin intact bilaterally Musculoskeletal: Pes planus. Right foot tenderness to palpation over dorsal more than plantar, proximal lateral metatarsals/sinus tarsi and ankle. Minimal swelling noted in this area at this time. No significant warmth.  Did not observe altered hair grown, sweating or nail change. She did not some discomfort with dorsiflexion of her lateral toes. No significant pain with inversion/eversion of the foot.  No significant lumbar spinal tenderness SLR negative bilaterally     Assessment & Plan:   Right lateral chronic foot pain Ideology not completely clear at this time.  She does have several symptoms consistent with CPRS including severe aching pain out of proportion to physical findings, swelling, muscle spasms.   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  It is possible that her treatments with physical therapy, continued ambulation, neuropathic medications and steroids have been providing improvement.  She does report swelling over this area has improved significantly over time although she continues to have pain. -She has reported significant relief with neuropathic pain medication such as gabapentin and Lyrica however she has not been able to tolerate them at higher doses -We will order duloxetine 30 mg, can consider increasing to 60 mg if she tolerates this well -Could consider triple phase bone scan for further evaluation at next visit

## 2021-12-13 ENCOUNTER — Encounter (INDEPENDENT_AMBULATORY_CARE_PROVIDER_SITE_OTHER): Payer: Self-pay | Admitting: Internal Medicine

## 2021-12-18 ENCOUNTER — Encounter: Payer: Self-pay | Admitting: Nurse Practitioner

## 2021-12-18 ENCOUNTER — Ambulatory Visit: Payer: Managed Care, Other (non HMO) | Admitting: Nurse Practitioner

## 2021-12-18 VITALS — BP 130/70 | HR 92 | Temp 98.2°F | Ht 63.0 in | Wt 214.0 lb

## 2021-12-18 DIAGNOSIS — Z6837 Body mass index (BMI) 37.0-37.9, adult: Secondary | ICD-10-CM | POA: Diagnosis not present

## 2021-12-18 DIAGNOSIS — Z6838 Body mass index (BMI) 38.0-38.9, adult: Secondary | ICD-10-CM

## 2021-12-18 DIAGNOSIS — Z23 Encounter for immunization: Secondary | ICD-10-CM | POA: Diagnosis not present

## 2021-12-18 DIAGNOSIS — E6609 Other obesity due to excess calories: Secondary | ICD-10-CM

## 2021-12-18 MED ORDER — PHENTERMINE HCL 37.5 MG PO CAPS: 37.5000 mg | ORAL_CAPSULE | ORAL | 1 refills | Status: DC

## 2021-12-18 NOTE — Patient Instructions (Addendum)
Obesity, Adult ?Obesity is having too much body fat. Being obese means that your weight is more than what is healthy for you.  ?BMI (body mass index) is a number that explains how much body fat you have. If you have a BMI of 30 or more, you are obese. ?Obesity can cause serious health problems, such as: ?Stroke. ?Coronary artery disease (CAD). ?Type 2 diabetes. ?Some types of cancer. ?High blood pressure (hypertension). ?High cholesterol. ?Gallbladder stones. ?Obesity can also contribute to: ?Osteoarthritis. ?Sleep apnea. ?Infertility problems. ?What are the causes? ?Eating meals each day that are high in calories, sugar, and fat. ?Drinking a lot of drinks that have sugar in them. ?Being born with genes that may make you more likely to become obese. ?Having a medical condition that causes obesity. ?Taking certain medicines. ?Sitting a lot (having a sedentary lifestyle). ?Not getting enough sleep. ?What increases the risk? ?Having a family history of obesity. ?Living in an area with limited access to: ?Parks, recreation centers, or sidewalks. ?Healthy food choices, such as grocery stores and farmers' markets. ?What are the signs or symptoms? ?The main sign is having too much body fat. ?How is this treated? ?Treatment for this condition often includes changing your lifestyle. Treatment may include: ?Changing your diet. This may include making a healthy meal plan. ?Exercise. This may include activity that causes your heart to beat faster (aerobic exercise) and strength training. Work with your doctor to design a program that works for you. ?Medicine to help you lose weight. This may be used if you are not able to lose one pound a week after 6 weeks of healthy eating and more exercise. ?Treating conditions that cause the obesity. ?Surgery. Options may include gastric banding and gastric bypass. This may be done if: ?Other treatments have not helped to improve your condition. ?You have a BMI of 40 or higher. ?You have  life-threatening health problems related to obesity. ?Follow these instructions at home: ?Eating and drinking ? ?Follow advice from your doctor about what to eat and drink. Your doctor may tell you to: ?Limit fast food, sweets, and processed snack foods. ?Choose low-fat options. For example, choose low-fat milk instead of whole milk. ?Eat five or more servings of fruits or vegetables each day. ?Eat at home more often. This gives you more control over what you eat. ?Choose healthy foods when you eat out. ?Learn to read food labels. This will help you learn how much food is in one serving. ?Keep low-fat snacks available. ?Avoid drinks that have a lot of sugar in them. These include soda, fruit juice, iced tea with sugar, and flavored milk. ?Drink enough water to keep your pee (urine) pale yellow. ?Do not go on fad diets. ?Physical activity ?Exercise often, as told by your doctor. Most adults should get up to 150 minutes of moderate-intensity exercise every week.Ask your doctor: ?What types of exercise are safe for you. ?How often you should exercise. ?Warm up and stretch before being active. ?Do slow stretching after being active (cool down). ?Rest between times of being active. ?Lifestyle ?Work with your doctor and a food expert (dietitian) to set a weight-loss goal that is best for you. ?Limit your screen time. ?Find ways to reward yourself that do not involve food. ?Do not drink alcohol if: ?Your doctor tells you not to drink. ?You are pregnant, may be pregnant, or are planning to become pregnant. ?If you drink alcohol: ?Limit how much you have to: ?0-1 drink a day for women. ?0-2 drinks   a day for men. ?Know how much alcohol is in your drink. In the [place] one drink equals one 12 oz bottle of beer (355 mL), one 5 oz glass of wine (148 mL), or one 1? oz glass of hard liquor (44 mL). ?General instructions ?Keep a weight-loss journal. This can help you keep track of: ?The food that you eat. ?How much exercise you  get. ?Take over-the-counter and prescription medicines only as told by your doctor. ?Take vitamins and supplements only as told by your doctor. ?Think about joining a support group. ?Pay attention to your mental health as obesity can lead to depression or self esteem issues. ?Keep all follow-up visits. ?Contact a doctor if: ?You cannot meet your weight-loss goal after you have changed your diet and lifestyle for 6 weeks. ?You are having trouble breathing. ?Summary ?Obesity is having too much body fat. ?Being obese means that your weight is more than what is healthy for you. ?Work with your doctor to set a weight-loss goal. ?Get regular exercise as told by your doctor. ?This information is not intended to replace advice given to you by your health care provider. Make sure you discuss any questions you have with your health care provider. ?Document Revised: 10/02/2020 Document Reviewed: 10/02/2020 ?Elsevier Patient Education ? 2023 [employer] ? ?

## 2021-12-18 NOTE — Progress Notes (Signed)
I,Tianna Badgett,acting as a Education administrator for [employer], FNP.,have documented all relevant documentation on the behalf of Minette Brine, FNP,as directed by  Minette Brine, FNP while in the presence of Minette Brine, [place].  Subjective:     Patient ID: Krista Hogan , female    DOB: [birth year] , 41 y.o.   MRN: [id number]   Chief Complaint  Patient presents with   Weight Check    HPI  Patient presents today for weight check. She states that with the phentermine she is sweating more often. She states that she is working out about 2 days a week. She has not been but one time      Past Medical History:  Diagnosis Date   DUB (dysfunctional uterine bleeding)    Graves disease 06/2016   History of abnormal cervical Pap smear    2011   History of HPV infection    Uterine fibroid    Wears glasses      Family History  Problem Relation Age of Onset   Fibroids Mother    Thyroid disease Neg Hx      Current Outpatient Medications:    cetirizine (ZYRTEC) 10 MG tablet, Take 1 tablet by mouth daily., Disp: , Rfl:    cholecalciferol (VITAMIN D) 1000 units tablet, Take 1,000 Units by mouth daily., Disp: , Rfl:    DULoxetine (CYMBALTA) 30 MG capsule, Take 1 capsule (30 mg total) by mouth daily., Disp: 30 capsule, Rfl: 2   gabapentin (NEURONTIN) 100 MG capsule, Take 1 capsule (100 mg total) by mouth at bedtime., Disp: 90 capsule, Rfl: 3   ibuprofen (ADVIL) 800 MG tablet, TAKE 1 TABLET BY MOUTH EVERY 8 HOURS AS NEEDED, Disp: 30 tablet, Rfl: 5   meloxicam (MOBIC) 15 MG tablet, Take 1 tablet (15 mg total) by mouth daily., Disp: 30 tablet, Rfl: 0   Omega-3 Fatty Acids (FISH OIL) 1000 MG CAPS, Take 1,000 mg by mouth daily. , Disp: , Rfl:    phentermine 37.5 MG capsule, Take 1 capsule (37.5 mg total) by mouth every morning., Disp: 30 capsule, Rfl: 1   spironolactone (ALDACTONE) 50 MG tablet, Take 1 tablet by mouth daily., Disp: , Rfl:    tretinoin (RETIN-A) 0.025 % cream, Apply to face nightly to  tolerance (Patient not taking: Reported on 12/12/2021), Disp: , Rfl:   Current Facility-Administered Medications:    triamcinolone acetonide (KENALOG) 10 MG/ML injection 10 mg, 10 mg, Other, Once, [place] Kettle, Titorya, DPM   Allergies  Allergen Reactions   Minocycline Rash     Review of Systems  Constitutional: Negative.   Respiratory: Negative.    Cardiovascular: Negative.   Gastrointestinal: Negative.   Neurological: Negative.   Psychiatric/Behavioral: Negative.       Today's Vitals   12/18/21 1613  BP: 130/70  Pulse: 92  Temp: 98.2 F (36.8 C)  TempSrc: Oral  Weight: 214 lb (97.1 kg)  Height: '5\' 3"'$  (1.6 m)   Body mass index is 37.91 kg/m.  Wt Readings from Last 3 Encounters:  12/18/21 214 lb (97.1 kg)  12/12/21 215 lb (97.5 kg)  12/04/21 213 lb (96.6 kg)   Body mass index is 37.91 kg/m.   Objective:  Physical Exam Vitals reviewed.  Constitutional:      General: She is not in acute distress.    Appearance: Normal appearance.  Cardiovascular:     Rate and Rhythm: Normal rate and regular rhythm.     Pulses: Normal pulses.     Heart sounds: Normal heart  sounds. No murmur heard. Pulmonary:     Effort: Pulmonary effort is normal. No respiratory distress.     Breath sounds: Normal breath sounds. No wheezing.  Skin:    General: Skin is warm and dry.     Capillary Refill: Capillary refill takes less than 2 seconds.  Neurological:     General: No focal deficit present.     Mental Status: She is alert and oriented to person, place, and time.     Cranial Nerves: No cranial nerve deficit.     Motor: No weakness.  Psychiatric:        Mood and Affect: Mood normal.        Behavior: Behavior normal.        Thought Content: Thought content normal.        Judgment: Judgment normal.         Assessment And Plan:     1. Class 2 obesity due to excess calories without serious comorbidity with body mass index (BMI) of 37.0 to 37.9 in adult Comments: Weight is stable, she  is unable to exercise due to her feet pain. Encouraged to do chair exercise or low impact type exercises with strength training - Amb Ref to Medical Weight Management - phentermine 37.5 MG capsule; Take 1 capsule (37.5 mg total) by mouth every morning.  Dispense: 30 capsule; Refill: 1  2. Need for influenza vaccination Influenza vaccine administered Encouraged to take Tylenol as needed for fever or muscle aches. - Flu Vaccine QUAD 6+ mos PF IM (Fluarix Quad PF)     Patient was given opportunity to ask questions. Patient verbalized understanding of the plan and was able to repeat key elements of the plan. All questions were answered to their satisfaction.  Minette Brine, FNP   I, Minette Brine, FNP, have reviewed all documentation for this visit. The documentation on 12/18/21 for the exam, diagnosis, procedures, and orders are all accurate and complete.   IF YOU HAVE BEEN REFERRED TO A SPECIALIST, IT MAY TAKE 1-2 WEEKS TO SCHEDULE/PROCESS THE REFERRAL. IF YOU HAVE NOT HEARD FROM US/SPECIALIST IN TWO WEEKS, PLEASE GIVE [place] A CALL AT [phone].   THE PATIENT IS ENCOURAGED TO PRACTICE SOCIAL DISTANCING DUE TO THE COVID-19 PANDEMIC.

## 2021-12-25 ENCOUNTER — Ambulatory Visit (INDEPENDENT_AMBULATORY_CARE_PROVIDER_SITE_OTHER): Payer: Managed Care, Other (non HMO) | Admitting: Internal Medicine

## 2021-12-27 NOTE — Telephone Encounter (Signed)
Left patient vm to call me back in regard.

## 2022-01-09 ENCOUNTER — Encounter: Payer: Managed Care, Other (non HMO) | Admitting: Physical Medicine & Rehabilitation

## 2022-02-10 ENCOUNTER — Telehealth: Payer: Self-pay | Admitting: Nurse Practitioner

## 2022-02-10 NOTE — Telephone Encounter (Signed)
Patient called and asked if she can been seen for a new patient appointment because her friend  [facility] referred her. Call back number [phone].

## 2022-02-11 NOTE — Telephone Encounter (Signed)
LVM for patient to call and schedule

## 2022-02-11 NOTE — Telephone Encounter (Signed)
Happy to take patient, but please notify her that I do not prescribe phentermine. I see this is on her medication list.

## 2022-02-12 NOTE — Telephone Encounter (Signed)
Patient has been scheduled

## 2022-02-18 ENCOUNTER — Encounter: Payer: Managed Care, Other (non HMO) | Admitting: Nurse Practitioner

## 2022-02-18 ENCOUNTER — Other Ambulatory Visit: Payer: Managed Care, Other (non HMO)

## 2022-02-18 NOTE — Progress Notes (Signed)
Not seen

## 2022-02-18 NOTE — Patient Instructions (Signed)

## 2022-02-20 ENCOUNTER — Encounter: Payer: Managed Care, Other (non HMO) | Admitting: Physical Medicine & Rehabilitation

## 2022-02-27 ENCOUNTER — Ambulatory Visit: Payer: Managed Care, Other (non HMO) | Admitting: Primary Care

## 2022-02-27 ENCOUNTER — Encounter: Payer: Self-pay | Admitting: Primary Care

## 2022-02-27 VITALS — BP 108/78 | HR 80 | Temp 98.1°F | Ht 63.0 in | Wt 219.0 lb

## 2022-02-27 DIAGNOSIS — E6609 Other obesity due to excess calories: Secondary | ICD-10-CM

## 2022-02-27 DIAGNOSIS — E0500 Thyrotoxicosis with diffuse goiter without thyrotoxic crisis or storm: Secondary | ICD-10-CM | POA: Diagnosis not present

## 2022-02-27 DIAGNOSIS — R202 Paresthesia of skin: Secondary | ICD-10-CM | POA: Diagnosis not present

## 2022-02-27 DIAGNOSIS — M25531 Pain in right wrist: Secondary | ICD-10-CM

## 2022-02-27 DIAGNOSIS — G8929 Other chronic pain: Secondary | ICD-10-CM

## 2022-02-27 DIAGNOSIS — Z6838 Body mass index (BMI) 38.0-38.9, adult: Secondary | ICD-10-CM

## 2022-02-27 DIAGNOSIS — G43009 Migraine without aura, not intractable, without status migrainosus: Secondary | ICD-10-CM

## 2022-02-27 DIAGNOSIS — M79671 Pain in right foot: Secondary | ICD-10-CM

## 2022-02-27 DIAGNOSIS — L700 Acne vulgaris: Secondary | ICD-10-CM

## 2022-02-27 MED ORDER — SEMAGLUTIDE-WEIGHT MANAGEMENT 0.25 MG/0.5ML ~~LOC~~ SOAJ: 0.2500 mg | SUBCUTANEOUS | 0 refills | Status: DC

## 2022-02-27 NOTE — Assessment & Plan Note (Signed)
Symptoms suggestive of carpal tunnel syndrome from repetitive use of her right upper extremity. Also based on exam today.  Referral placed to orthopedics for further evaluation.

## 2022-02-27 NOTE — Assessment & Plan Note (Signed)
Mostly allergy/environmentally induced.  Offered further treatment including abortive treatment, she kindly declines for now. Continue Excedrin Migraine PRN

## 2022-02-27 NOTE — Assessment & Plan Note (Signed)
Do agree that weight loss will help a lot of her ailments.   Recommended to remain off Phentermine.   Start Wegovy 0.25 mg weekly x 4 weeks, then increase to 0.5 mg weekly thereafter. She will call when she uses her last Wegovy 0.25 mg pen.  Discussed to limit starchy food, take out food, and Ginger Ale.

## 2022-02-27 NOTE — Assessment & Plan Note (Signed)
Following with dermatology.  Continue spironolactone 50 mg daily.

## 2022-02-27 NOTE — Assessment & Plan Note (Addendum)
Following with endocrinology, office notes and labs reviewed from June 2023.  Repeat thyroid studies pending. Remain off treatment.

## 2022-02-27 NOTE — Assessment & Plan Note (Signed)
Will work with patient for weight loss.  Continue podiatry evaluation.

## 2022-02-27 NOTE — Patient Instructions (Addendum)
Start semaglutide [facility]) 0.25 mg.  Inject 0.25 mg into the skin once weekly x 4 weeks.  Please message me once you use your last pen.  Work on cutting back on [employer] and ginger ale.  You will either be contacted via phone regarding your referral to orthopedics, or you may receive a letter on your MyChart portal from our referral team with instructions for scheduling an appointment. Please let us know if you have not been contacted by anyone within two weeks.  Stop by the lab prior to leaving today. I will notify you of your results once received.   Set up a follow-up visit for 2 months after you start your [facility] medicine.  It was a pleasure to meet you today! Please don't hesitate to contact me with any questions. Welcome to [employer]!

## 2022-02-27 NOTE — Progress Notes (Signed)
Subjective:    Patient ID: Krista Hogan, female    DOB: Jan 13, 1981, 41 y.o.   MRN: [id number]  HPI  Krista Hogan is a very pleasant 41 y.o. female who presents today   1) Class 2 Obesity: Obesity: Currently managed on phentermine 37.5 mg capsules daily for which she has taken for several months. She is no longer taking Phentermine, it caused increased food cravings and sweating.   She was referred to [facility] [place], but had to miss her appointment due to a family emergency. She was once managed on Wegovy 0.25 mg weekly but the [employer] became out of stock at [employer].   Diet currently consists of:  Breakfast: Skips  Lunch: Homemade sandwich, Subway, Wendy's (burgers/fries) Dinner: [employer], vegetable, mac and cheese, noodles, chicken mostly.  Snacks: None Desserts: Chocolate several times a month.  Beverages: Water, Ginger Ale  Exercise: Sedentary    2) Migraines: Allergy and weather induced. Migraines are typically located to the frontal lobes. She will experience photophobia and phonophobia. Migraines occur once monthly which will last for hours. She will take Excedrin Migraine which helps some.   She does take Zyrtec 10 mg daily.   3) Grave's Disease: Not currently managed on medication. Following with endocrinology, Dr. Cruzita Lederer, last office visit was in June 2023. She underwent TSH, free T4, free T3.  Free T3 was slightly high but not worrisome.  It was recommended she remain off of methimazole and other treatment for now.  She is due for repeat thyroid studies now, but she stopped Phentermine so she doesn't plan on going for labs.   4) Paresthesias: Chronic and intermittent to the right upper extremity shoulder with radiation down to her fingertips. Also with pain. Her symptoms began about 1 year ago, occurring daily when waking in the morning. Initially, she experienced numbness only to her right upper extremity, but over the last few months  she's experienced numbness and pain.   She denies symptoms during the day or any other time of day. She does a lot of repetitive movement during the day with work to her right shoulder and arm. She will sometimes wear a brace to her right wrist for intermittent pain.   She does notice right shoulder pain  5) Chronic Foot Pain: Located to the right lateral foot mostly. History of plantar fasciitis. Diagnosed with Complex regional pain syndrome. Completed several rounds of physical therapy which helped some but without resolve.   She was placed on gabapentin which helped with symptoms but caused drowsiness.    Review of Systems  Respiratory:  Negative for shortness of breath.   Cardiovascular:  Negative for chest pain.  Musculoskeletal:  Positive for arthralgias. Negative for joint swelling and neck pain.  Skin:  Negative for color change.  Neurological:  Positive for numbness.         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   Irritant dermatitis 03/21/2020   Palpitations 02/25/2019   Postpartum hypertension 01/10/2018   S/P repeat low transverse C-section 01/06/2018   Uterine fibroid    Wears glasses     Social History  Socioeconomic History   Marital status: Single    Spouse name: Not on file   Number of children: Not on file   Years of education: Not on file   Highest education level: Not on file  Occupational History   Not on file  Tobacco Use   Smoking status: Every Day    Packs/day: 0.25    Years: 10.00    Total pack years: 2.50    Types: Cigarettes    Last attempt to quit: 04/2017    Years since quitting: 4.8   Smokeless tobacco: Never  Vaping Use   Vaping  Use: Never used  Substance and Sexual Activity   Alcohol use: Yes    Alcohol/week: 0.0 standard drinks of alcohol    Comment: SOCIAL   Drug use: No   Sexual activity: Yes    Partners: Male    Birth control/protection: Pill, None  Other Topics Concern   Not on file  Social History Narrative   Not on file   Social Determinants of Health   Financial Resource Strain: Low Risk  (12/22/2017)   Overall Financial Resource Strain (CARDIA)    Difficulty of Paying Living Expenses: Not hard at all  Food Insecurity: No Food Insecurity (12/22/2017)   Hunger Vital Sign    Worried About Running Out of Food in the Last Year: Never true    Ran Out of Food in the Last Year: Never true  Transportation Needs: Unknown (12/22/2017)   PRAPARE - Hydrologist (Medical): No    Lack of Transportation (Non-Medical): Not on file  Physical Activity: Not on file  Stress: Stress Concern Present (12/22/2017)   [place]    Feeling of Stress : To some extent  Social Connections: Not on file  Intimate Partner Violence: Not At Risk (12/22/2017)   Humiliation, Afraid, Rape, and Kick questionnaire    Fear of Current or Ex-Partner: No    Emotionally Abused: No    Physically Abused: No    Sexually Abused: No    Past Surgical History:  Procedure Laterality Date   CESAREAN SECTION N/A 01/06/2018   Procedure: CESAREAN SECTION;  Surgeon: Caren Macadam, MD;  Location: [place];  Service: Obstetrics;  Laterality: N/A;   EXCISION OF SKIN TAG  11/22/2014   Procedure: EXCISION OF SKIN TAG VULVA;  Surgeon: Governor Specking, MD;  Location: [place];  Service: Gynecology;;   LAPAROSCOPIC GELPORT ASSISTED MYOMECTOMY N/A 11/22/2014   Procedure: LAPAROSCOPIC GELPORT ASSISTED MYOMECTOMY WITH CHROMOTUBATION;  Surgeon: Governor Specking, MD;  Location: [place];  Service: Gynecology;   Laterality: N/A;   LAPAROSCOPY N/A 11/22/2014   Procedure: LAPAROSCOPY DIAGNOSTIC;  Surgeon: Governor Specking, MD;  Location: [place];  Service: Gynecology;  Laterality: N/A;   LEEP  2011   TONSILLECTOMY AND ADENOIDECTOMY  age 60   WISDOM TOOTH EXTRACTION  2006    Family History  Problem Relation Age of Onset   Fibroids Mother    Thyroid disease Neg Hx     Allergies  Allergen Reactions   Minocycline Rash    Current Outpatient Medications on File Prior to Visit  Medication Sig Dispense Refill   cetirizine (ZYRTEC) 10 MG tablet Take 1 tablet by mouth daily.     cholecalciferol (VITAMIN D) 1000 units tablet Take 1,000 Units by mouth daily.     DULoxetine (CYMBALTA) 30 MG capsule Take 1 capsule (30 mg total)  by mouth daily. 30 capsule 2   ibuprofen (ADVIL) 800 MG tablet TAKE 1 TABLET BY MOUTH EVERY 8 HOURS AS NEEDED 30 tablet 5   Omega-3 Fatty Acids (FISH OIL) 1000 MG CAPS Take 1,000 mg by mouth daily.      spironolactone (ALDACTONE) 50 MG tablet Take 1 tablet by mouth daily.     tretinoin (RETIN-A) 0.025 % cream      Current Facility-Administered Medications on File Prior to Visit  Medication Dose Route Frequency Provider Last Rate Last Admin   triamcinolone acetonide (KENALOG) 10 MG/ML injection 10 mg  10 mg Other Once Stover, Titorya, DPM        BP 108/78   Pulse 80   Temp 98.1 F (36.7 C) (Temporal)   Ht _0  (1.6 m)   Wt 219 lb (99.3 kg)   SpO2 99%   BMI 38.79 kg/m  Objective:   Physical Exam Cardiovascular:     Rate and Rhythm: Normal rate and regular rhythm.  Pulmonary:     Effort: Pulmonary effort is normal.     Breath sounds: Normal breath sounds.  Musculoskeletal:     Right shoulder: Normal range of motion. Normal strength.     Left shoulder: Normal range of motion. Normal strength.     Cervical back: Neck supple.  Skin:    General: Skin is warm and dry.  Neurological:     Comments: Positive Phalen's and Tinel's signs to right wrist on  exam           Assessment & Plan:   Problem List Items Addressed This Visit       Cardiovascular and Mediastinum   Migraines    Mostly allergy/environmentally induced.  Offered further treatment including abortive treatment, she kindly declines for now. Continue Excedrin Migraine PRN        Endocrine   Graves disease    Following with endocrinology, office notes and labs reviewed from June 2023.  Repeat thyroid studies pending. Remain off treatment.      Relevant Orders   TSH   T4, free   T3, Free     Musculoskeletal and Integument   Acne vulgaris    Following with dermatology.  Continue spironolactone 50 mg daily.         Other   Class 2 obesity due to excess calories with body mass index (BMI) of 38.0 to 38.9 in adult - Primary    Do agree that weight loss will help a lot of her ailments.   Recommended to remain off Phentermine.   Start Wegovy 0.25 mg weekly x 4 weeks, then increase to 0.5 mg weekly thereafter. She will call when she uses her last Wegovy 0.25 mg pen.  Discussed to limit starchy food, take out food, and Ginger Ale.      Relevant Medications   Semaglutide-Weight Management 0.25 MG/0.5ML SOAJ   Other Relevant Orders   Hemoglobin K0X   Basic metabolic panel   Wrist pain, chronic, right    Symptoms suggestive of carpal tunnel syndrome from repetitive use of her right upper extremity. Also based on exam today.  Referral placed to orthopedics for further evaluation.       Relevant Orders   Ambulatory referral to Orthopedic Surgery   Chronic foot pain, right    Will work with patient for weight loss.  Continue podiatry evaluation.       Other Visit Diagnoses     Paresthesias       Relevant Orders  CBC   Vitamin B12          Pleas Koch, NP

## 2022-02-28 LAB — T3, FREE: T3, Free: 3.3 pg/mL (ref 2.0–4.4)

## 2022-02-28 LAB — HEMOGLOBIN A1C
Est. average glucose Bld gHb Est-mCnc: 117 mg/dL
Hgb A1c MFr Bld: 5.7 % — ABNORMAL HIGH (ref 4.8–5.6)

## 2022-02-28 LAB — BASIC METABOLIC PANEL
BUN/Creatinine Ratio: 19 (ref 9–23)
BUN: 14 mg/dL (ref 6–24)
CO2: 23 mmol/L (ref 20–29)
Calcium: 9.4 mg/dL (ref 8.7–10.2)
Chloride: 102 mmol/L (ref 96–106)
Creatinine, Ser: 0.75 mg/dL (ref 0.57–1.00)
Glucose: 76 mg/dL (ref 70–99)
Potassium: 4.6 mmol/L (ref 3.5–5.2)
Sodium: 138 mmol/L (ref 134–144)
eGFR: 103 mL/min/{1.73_m2} (ref >59)

## 2022-02-28 LAB — T4, FREE: Free T4: 1.1 ng/dL (ref 0.82–1.77)

## 2022-02-28 LAB — CBC
Hematocrit: 39.9 % (ref 34.0–46.6)
Hemoglobin: 13.8 g/dL (ref 11.1–15.9)
MCH: 32.3 pg (ref 26.6–33.0)
MCHC: 34.6 g/dL (ref 31.5–35.7)
MCV: 93 fL (ref 79–97)
Platelets: 382 10*3/uL (ref 150–450)
RBC: 4.27 x10E6/uL (ref 3.77–5.28)
RDW: 12.8 % (ref 11.7–15.4)
WBC: 8.7 10*3/uL (ref 3.4–10.8)

## 2022-02-28 LAB — TSH: TSH: 0.461 u[IU]/mL (ref 0.450–4.500)

## 2022-02-28 LAB — VITAMIN B12: Vitamin B-12: 829 pg/mL (ref 232–1245)

## 2022-03-19 ENCOUNTER — Ambulatory Visit: Payer: Managed Care, Other (non HMO) | Admitting: Orthopaedic Surgery

## 2022-03-19 ENCOUNTER — Encounter: Payer: Self-pay | Admitting: Orthopaedic Surgery

## 2022-03-19 DIAGNOSIS — M79642 Pain in left hand: Secondary | ICD-10-CM | POA: Diagnosis not present

## 2022-03-19 DIAGNOSIS — M79641 Pain in right hand: Secondary | ICD-10-CM | POA: Diagnosis not present

## 2022-03-19 NOTE — Progress Notes (Signed)
Office Visit Note   Patient: Krista Hogan           Date of Birth: Nov 17, 1980           MRN: [id number] Visit Date: 03/19/2022              Requested by: Pleas Koch, NP [place],  [place] [address] PCP: Pleas Koch, NP   Assessment & Plan: Visit Diagnoses:  1. Pain in both hands     Plan: Impression is bilateral carpal tunnel syndrome.  Disease process explained and treatment options were reviewed.  At this point she would like to try carpal tunnel splints at night and would like to undergo nerve conduction studies.  We will have her follow-up after the nerve conduction studies.  Follow-Up Instructions: No follow-ups on file.   Orders:  Orders Placed This Encounter  Procedures   Ambulatory referral to Physical Medicine Rehab   No orders of the defined types were placed in this encounter.     Procedures: No procedures performed   Clinical Data: No additional findings.   Subjective: Chief Complaint  Patient presents with   Right Wrist - Pain, Numbness   Left Wrist - Pain, Numbness    HPI [name] is a 42 year old female comes in for complaints of bilateral wrist pain and hand numbness greater in the right hand usually upon waking up.  Has worn carpal tunnel braces for a few weeks in the past with some improvement in symptoms.  Not taking medications.  Right-hand-dominant.  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  Review of Systems  Constitutional: Negative.   HENT: Negative.    Eyes: Negative.   Respiratory: Negative.    Cardiovascular: Negative.   Endocrine: Negative.   Musculoskeletal: Negative.   Neurological: Negative.   Hematological: Negative.   Psychiatric/Behavioral: Negative.    All other systems reviewed and are negative.    Objective: Vital Signs: There were no vitals taken for this visit.  Physical Exam Vitals and nursing note reviewed.  Constitutional:      Appearance: She is  well-developed.  HENT:     Head: Atraumatic.     Nose: Nose normal.  Eyes:     Extraocular Movements: Extraocular movements intact.  Cardiovascular:     Pulses: Normal pulses.  Pulmonary:     Effort: Pulmonary effort is normal.  Abdominal:     Palpations: Abdomen is soft.  Musculoskeletal:     Cervical back: Neck supple.  Skin:    General: Skin is warm.     Capillary Refill: Capillary refill takes less than 2 seconds.  Neurological:     Mental Status: She is alert. Mental status is at baseline.  Psychiatric:        Behavior: Behavior normal.        Thought Content: Thought content normal.        Judgment: Judgment normal.     Ortho Exam Examination of bilateral hands showed no muscle atrophy.  Normal sensation and capillary refill.  Normal motor strength.  Positive carpal tunnel signs on the right hand. Specialty Comments:  No specialty comments available.  Imaging: No results found.   PMFS History: Patient Active Problem List   Diagnosis Date Noted   Wrist pain, chronic, right 02/27/2022   Chronic foot pain, right 02/27/2022   Chronic idiopathic constipation 03/29/2020   Acne vulgaris 11/02/2019   Hyperpigmentation 11/02/2019   Gastroesophageal reflux disease  without esophagitis 08/29/2019   Globus pharyngeus 08/29/2019   Nasal turbinate hypertrophy 08/29/2019   Rhinitis, chronic 08/29/2019   Dysesthesia 02/01/2019   Class 2 obesity due to excess calories with body mass index (BMI) of 38.0 to 38.9 in adult 02/01/2019   Graves disease 10/01/2016   Genital herpes simplex 07/29/2016   Genital warts 07/28/2016   Migraines 07/28/2016   Mucosal irritation of oral cavity 12/17/2015   Chronic pelvic pain in female 11/15/2015   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   Past Medical History:  Diagnosis Date   Acne scarring 11/02/2019   Acute anal fissure 03/29/2020   Cyst of ovary 07/28/2016   DUB (dysfunctional uterine bleeding)    Graves disease 06/2016    Groin pain 02/01/2019   History of abnormal cervical Pap smear    2011   History of HPV infection    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   Irritant dermatitis 03/21/2020   Palpitations 02/25/2019   Postpartum hypertension 01/10/2018   S/P repeat low transverse C-section 01/06/2018   Uterine fibroid    Wears glasses     Family History  Problem Relation Age of Onset   Fibroids Mother    Thyroid disease Neg Hx     Past Surgical History:  Procedure Laterality Date   CESAREAN SECTION N/A 01/06/2018   Procedure: CESAREAN SECTION;  Surgeon: Caren Macadam, MD;  Location: [place];  Service: Obstetrics;  Laterality: N/A;   EXCISION OF SKIN TAG  11/22/2014   Procedure: EXCISION OF SKIN TAG VULVA;  Surgeon: Governor Specking, MD;  Location: [place];  Service: Gynecology;;   LAPAROSCOPIC GELPORT ASSISTED MYOMECTOMY N/A 11/22/2014   Procedure: LAPAROSCOPIC GELPORT ASSISTED MYOMECTOMY WITH CHROMOTUBATION;  Surgeon: Governor Specking, MD;  Location: [place];  Service: Gynecology;  Laterality: N/A;   LAPAROSCOPY N/A 11/22/2014   Procedure: LAPAROSCOPY DIAGNOSTIC;  Surgeon: Governor Specking, MD;  Location: [place];  Service: Gynecology;  Laterality: N/A;   LEEP  2011   TONSILLECTOMY AND ADENOIDECTOMY  age 77   WISDOM TOOTH EXTRACTION  2006   Social History   Occupational History   Not on file  Tobacco Use   Smoking status: Every Day    Packs/day: 0.25    Years: 10.00    Total pack years: 2.50    Types: Cigarettes    Last attempt to quit: 04/2017    Years since quitting: 4.9   Smokeless tobacco: Never  Vaping Use   Vaping Use: Never used  Substance and Sexual Activity   Alcohol use: Yes    Alcohol/week: 0.0 standard drinks of alcohol    Comment: SOCIAL   Drug use: No   Sexual activity: Yes    Partners: Male    Birth control/protection: Pill, None

## 2022-03-27 ENCOUNTER — Encounter
Payer: Managed Care, Other (non HMO) | Attending: Physical Medicine & Rehabilitation | Admitting: Physical Medicine & Rehabilitation

## 2022-03-27 DIAGNOSIS — G90521 Complex regional pain syndrome I of right lower limb: Secondary | ICD-10-CM | POA: Insufficient documentation

## 2022-03-27 DIAGNOSIS — M79671 Pain in right foot: Secondary | ICD-10-CM | POA: Insufficient documentation

## 2022-04-01 ENCOUNTER — Telehealth: Payer: Self-pay | Admitting: Primary Care

## 2022-04-01 ENCOUNTER — Other Ambulatory Visit: Payer: Self-pay

## 2022-04-01 DIAGNOSIS — E6609 Other obesity due to excess calories: Secondary | ICD-10-CM

## 2022-04-01 NOTE — Telephone Encounter (Signed)
She must start at the 0.25 mg dose x 4 weeks before we can increase the dose. Has she been taking the 0.25 mg dose x 4 weeks?

## 2022-04-01 NOTE — Telephone Encounter (Signed)
Pt called asking if Carlis Abbott would increase the dosage on her [facility] meds. Pt states she's having a hard issue finding the meds in stock with the dosage she's been rpescribed. Call back # [phone]

## 2022-04-02 MED ORDER — TIRZEPATIDE-WEIGHT MANAGEMENT 2.5 MG/0.5ML ~~LOC~~ SOAJ: 2.5000 mg | SUBCUTANEOUS | 0 refills | Status: DC

## 2022-04-02 NOTE — Addendum Note (Signed)
Addended by: Pleas Koch on: 04/02/2022 11:21 AM   Modules accepted: Orders

## 2022-04-02 NOTE — Telephone Encounter (Signed)
Called and spoke to patient she has not started the 0.'25mg'$  dose at all. It is out of stock with all the pharmacies she checked. She would like to know if there is an alternative to the wegovy that you would suggest.

## 2022-04-02 NOTE — Telephone Encounter (Signed)
We can try Zepbound [facility]) instead.   If she wants to try this then I can send a Rx to the pharmacy. She will start with 2.5 mg weekly x 4 weeks, then increase to 5 mg weekly thereafter.   Let me know what she thinks.

## 2022-04-02 NOTE — Telephone Encounter (Signed)
Noted, Rx for Zepbound 2.5 mg sent to pharmacy.

## 2022-04-02 NOTE — Telephone Encounter (Signed)
Called and spoke to patient, she is agreeable to starting Zepbound.

## 2022-04-09 ENCOUNTER — Ambulatory Visit (INDEPENDENT_AMBULATORY_CARE_PROVIDER_SITE_OTHER): Payer: Managed Care, Other (non HMO) | Admitting: Physical Medicine and Rehabilitation

## 2022-04-09 DIAGNOSIS — R202 Paresthesia of skin: Secondary | ICD-10-CM

## 2022-04-09 NOTE — Procedures (Signed)
EMG & NCV Findings: Evaluation of the left median (across palm) sensory nerve showed prolonged distal peak latency (Palm, 2.6 ms).  The right median (across palm) sensory nerve showed prolonged distal peak latency (Wrist, 3.9 ms) and prolonged distal peak latency (Palm, 3.3 ms).  All remaining nerves (as indicated in the following tables) were within normal limits.  All left vs. right side differences were within normal limits.    All examined muscles (as indicated in the following table) showed no evidence of electrical instability.    Impression: The above electrodiagnostic study is ABNORMAL and reveals evidence of a mild right median nerve entrapment at the wrist (carpal tunnel syndrome) affecting sensory components.   There is no significant electrodiagnostic evidence of any other focal nerve entrapment, brachial plexopathy or cervical radiculopathy in either upper limb.   Recommendations: 1.  Follow-up with referring physician. 2.  Continue current management of symptoms. 3.  Continue use of resting splint at night-time and as needed during the day.  ___________________________ Wonda Olds Board Certified, American Board of Physical Medicine and Rehabilitation    Nerve Conduction Studies Anti Sensory Summary Table   Stim Site NR Peak (ms) Norm Peak (ms) P-T Amp (V) Norm P-T Amp Site1 Site2 Delta-P (ms) Dist (cm) Vel (m/s) Norm Vel (m/s)  Left Median Acr Palm Anti Sensory (2nd Digit)  31.3C  Wrist    3.1 <3.6 79.1 >10 Wrist Palm 0.5 0.0    Palm    *2.6 <2.0 1.2         Right Median Acr Palm Anti Sensory (2nd Digit)  31.6C  Wrist    *3.9 <3.6 30.5 >10 Wrist Palm 0.6 0.0    Palm    *3.3 <2.0 28.8         Right Radial Anti Sensory (Base 1st Digit)  31.7C  Wrist    1.8 <3.1 42.1  Wrist Base 1st Digit 1.8 0.0    Right Ulnar Anti Sensory (5th Digit)  31.9C  Wrist    2.8 <3.7 29.3 >15.0 Wrist 5th Digit 2.8 14.0 50 >38   Motor Summary Table   Stim Site NR Onset (ms) Norm  Onset (ms) O-P Amp (mV) Norm O-P Amp Site1 Site2 Delta-0 (ms) Dist (cm) Vel (m/s) Norm Vel (m/s)  Left Median Motor (Abd Poll Brev)  32C  Wrist    3.0 <4.2 10.1 >5 Elbow Wrist 3.6 20.0 56 >50  Elbow    6.6  9.6         Right Median Motor (Abd Poll Brev)  31.6C  Wrist    3.7 <4.2 11.2 >5 Elbow Wrist 3.3 19.0 58 >50  Elbow    7.0  10.5         Right Ulnar Motor (Abd Dig Min)  31.8C  Wrist    2.5 <4.2 8.1 >3 B Elbow Wrist 2.8 19.0 68 >53  B Elbow    5.3  8.3  A Elbow B Elbow 1.1 11.0 100 >53  A Elbow    6.4  8.6          EMG   Side Muscle Nerve Root Ins Act Fibs Psw Amp Dur Poly Recrt Int Fraser Din Comment  Right Abd Poll Brev Median C8-T1 Nml Nml Nml Nml Nml 0 Nml Nml   Right 1stDorInt Ulnar C8-T1 Nml Nml Nml Nml Nml 0 Nml Nml   Right PronatorTeres Median C6-7 Nml Nml Nml Nml Nml 0 Nml Nml   Right Biceps Musculocut C5-6 Nml Nml Nml Nml Nml  0 Nml Nml   Right Deltoid Axillary C5-6 Nml Nml Nml Nml Nml 0 Nml Nml     Nerve Conduction Studies Anti Sensory Left/Right Comparison   Stim Site L Lat (ms) R Lat (ms) L-R Lat (ms) L Amp (V) R Amp (V) L-R Amp (%) Site1 Site2 L Vel (m/s) R Vel (m/s) L-R Vel (m/s)  Median Acr Palm Anti Sensory (2nd Digit)  31.3C  Wrist 3.1 *3.9 0.8 79.1 30.5 61.4 Wrist Palm     Palm *2.6 *3.3 0.7 1.2 28.8 95.8       Radial Anti Sensory (Base 1st Digit)  31.7C  Wrist  1.8   42.1  Wrist Base 1st Digit     Ulnar Anti Sensory (5th Digit)  31.9C  Wrist  2.8   29.3  Wrist 5th Digit  50    Motor Left/Right Comparison   Stim Site L Lat (ms) R Lat (ms) L-R Lat (ms) L Amp (mV) R Amp (mV) L-R Amp (%) Site1 Site2 L Vel (m/s) R Vel (m/s) L-R Vel (m/s)  Median Motor (Abd Poll Brev)  32C  Wrist 3.0 3.7 0.7 10.1 11.2 9.8 Elbow Wrist 56 58 2  Elbow 6.6 7.0 0.4 9.6 10.5 8.6       Ulnar Motor (Abd Dig Min)  31.8C  Wrist  2.5   8.1  B Elbow Wrist  68   B Elbow  5.3   8.3  A Elbow B Elbow  100   A Elbow  6.4   8.6           Waveforms:

## 2022-04-09 NOTE — Progress Notes (Signed)
Functional Pain Scale - descriptive words and definitions  Moderate (4)   Constantly aware of pain, can complete ADLs with modification/sleep marginally affected at times/passive distraction is of no use, but active distraction gives some relief. Moderate range order  Average Pain  varies, can get up to a 10 if not wearing brace  Right handed. Numbness, tingling, pain and weakness in both hands that radiates to the shoulders

## 2022-04-15 ENCOUNTER — Other Ambulatory Visit (HOSPITAL_COMMUNITY): Payer: Self-pay

## 2022-04-15 NOTE — Telephone Encounter (Signed)
Patient called in to check on status of prior authorization for zepbound?  Patient would like to know if Krista Hogan is familiar with Website RO,where she can get  wegovy for a discounted price? She's not sure if it is a Freight forwarder or a scam. Please Advise.

## 2022-04-15 NOTE — Telephone Encounter (Signed)
Did not see where it has been submitted. I have started on cover my meds.

## 2022-04-16 NOTE — Telephone Encounter (Signed)
Called patient and reviewed all information. Patient verbalized understanding. Will call if any further questions.  

## 2022-04-16 NOTE — Telephone Encounter (Signed)
Patient would like to know if Krista Hogan is familiar with Website RO,where she can get wegovy for a discounted price? She's not sure if it is a Freight forwarder or a scam. Please Advise.   PA has been started as of yesterday

## 2022-04-16 NOTE — Progress Notes (Signed)
Krista Hogan - 42 y.o. female MRN [id number]  Date of birth: November 24, 1980  Office Visit Note: Visit Date: 04/09/2022 PCP: Pleas Koch, NP Referred by: Leandrew Koyanagi, MD  Subjective: Chief Complaint  Patient presents with   Right Hand - Numbness, Pain, Weakness   Left Hand - Numbness, Pain, Weakness   HPI:  Krista Hogan is a 42 y.o. female who comes in today at the request of Dr. Eduard Roux for evaluation and management of chronic, worsening and severe pain, numbness and tingling in the Bilateral upper extremities.  Patient is Right hand dominant.  She reports chronic several month history of bilateral pain numbness and tingling in a global fashion that refers up to the shoulders.  No frank radicular symptoms.  She is not diabetic.  She is been wearing bracing that has helped to some degree.  She reports that she does not wear the brace as her pain can go as high as 10 out of 10.  No prior electrodiagnostic studies.   ROS Otherwise per HPI.  Assessment & Plan: Visit Diagnoses:    ICD-10-CM   1. Paresthesia of skin  R20.2 NCV with EMG (electromyography)      Plan: Impression: The above electrodiagnostic study is ABNORMAL and reveals evidence of a mild right median nerve entrapment at the wrist (carpal tunnel syndrome) affecting sensory components.   There is no significant electrodiagnostic evidence of any other focal nerve entrapment, brachial plexopathy or cervical radiculopathy in either upper limb.   Recommendations: 1.  Follow-up with referring physician. 2.  Continue current management of symptoms. 3.  Continue use of resting splint at night-time and as needed during the day.  Meds & Orders: No orders of the defined types were placed in this encounter.   Orders Placed This Encounter  Procedures   NCV with EMG (electromyography)    Follow-up: Return for  Eduard Roux, MD.   Procedures: No procedures performed  EMG & NCV Findings: Evaluation of the left  median (across palm) sensory nerve showed prolonged distal peak latency (Palm, 2.6 ms).  The right median (across palm) sensory nerve showed prolonged distal peak latency (Wrist, 3.9 ms) and prolonged distal peak latency (Palm, 3.3 ms).  All remaining nerves (as indicated in the following tables) were within normal limits.  All left vs. right side differences were within normal limits.    All examined muscles (as indicated in the following table) showed no evidence of electrical instability.    Impression: The above electrodiagnostic study is ABNORMAL and reveals evidence of a mild right median nerve entrapment at the wrist (carpal tunnel syndrome) affecting sensory components.   There is no significant electrodiagnostic evidence of any other focal nerve entrapment, brachial plexopathy or cervical radiculopathy in either upper limb.   Recommendations: 1.  Follow-up with referring physician. 2.  Continue current management of symptoms. 3.  Continue use of resting splint at night-time and as needed during the day.  ___________________________ Wonda Olds Board Certified, American Board of Physical Medicine and Rehabilitation    Nerve Conduction Studies Anti Sensory Summary Table   Stim Site NR Peak (ms) Norm Peak (ms) P-T Amp (V) Norm P-T Amp Site1 Site2 Delta-P (ms) Dist (cm) Vel (m/s) Norm Vel (m/s)  Left Median Acr Palm Anti Sensory (2nd Digit)  31.3C  Wrist    3.1 <3.6 79.1 >10 Wrist Palm 0.5 0.0    Palm    *2.6 <2.0 1.2  Right Median Acr Palm Anti Sensory (2nd Digit)  31.6C  Wrist    *3.9 <3.6 30.5 >10 Wrist Palm 0.6 0.0    Palm    *3.3 <2.0 28.8         Right Radial Anti Sensory (Base 1st Digit)  31.7C  Wrist    1.8 <3.1 42.1  Wrist Base 1st Digit 1.8 0.0    Right Ulnar Anti Sensory (5th Digit)  31.9C  Wrist    2.8 <3.7 29.3 >15.0 Wrist 5th Digit 2.8 14.0 50 >38   Motor Summary Table   Stim Site NR Onset (ms) Norm Onset (ms) O-P Amp (mV) Norm O-P Amp Site1  Site2 Delta-0 (ms) Dist (cm) Vel (m/s) Norm Vel (m/s)  Left Median Motor (Abd Poll Brev)  32C  Wrist    3.0 <4.2 10.1 >5 Elbow Wrist 3.6 20.0 56 >50  Elbow    6.6  9.6         Right Median Motor (Abd Poll Brev)  31.6C  Wrist    3.7 <4.2 11.2 >5 Elbow Wrist 3.3 19.0 58 >50  Elbow    7.0  10.5         Right Ulnar Motor (Abd Dig Min)  31.8C  Wrist    2.5 <4.2 8.1 >3 B Elbow Wrist 2.8 19.0 68 >53  B Elbow    5.3  8.3  A Elbow B Elbow 1.1 11.0 100 >53  A Elbow    6.4  8.6          EMG   Side Muscle Nerve Root Ins Act Fibs Psw Amp Dur Poly Recrt Int Fraser Din Comment  Right Abd Poll Brev Median C8-T1 Nml Nml Nml Nml Nml 0 Nml Nml   Right 1stDorInt Ulnar C8-T1 Nml Nml Nml Nml Nml 0 Nml Nml   Right PronatorTeres Median C6-7 Nml Nml Nml Nml Nml 0 Nml Nml   Right Biceps Musculocut C5-6 Nml Nml Nml Nml Nml 0 Nml Nml   Right Deltoid Axillary C5-6 Nml Nml Nml Nml Nml 0 Nml Nml     Nerve Conduction Studies Anti Sensory Left/Right Comparison   Stim Site L Lat (ms) R Lat (ms) L-R Lat (ms) L Amp (V) R Amp (V) L-R Amp (%) Site1 Site2 L Vel (m/s) R Vel (m/s) L-R Vel (m/s)  Median Acr Palm Anti Sensory (2nd Digit)  31.3C  Wrist 3.1 *3.9 0.8 79.1 30.5 61.4 Wrist Palm     Palm *2.6 *3.3 0.7 1.2 28.8 95.8       Radial Anti Sensory (Base 1st Digit)  31.7C  Wrist  1.8   42.1  Wrist Base 1st Digit     Ulnar Anti Sensory (5th Digit)  31.9C  Wrist  2.8   29.3  Wrist 5th Digit  50    Motor Left/Right Comparison   Stim Site L Lat (ms) R Lat (ms) L-R Lat (ms) L Amp (mV) R Amp (mV) L-R Amp (%) Site1 Site2 L Vel (m/s) R Vel (m/s) L-R Vel (m/s)  Median Motor (Abd Poll Brev)  32C  Wrist 3.0 3.7 0.7 10.1 11.2 9.8 Elbow Wrist 56 58 2  Elbow 6.6 7.0 0.4 9.6 10.5 8.6       Ulnar Motor (Abd Dig Min)  31.8C  Wrist  2.5   8.1  B Elbow Wrist  68   B Elbow  5.3   8.3  A Elbow B Elbow  100   A Elbow  6.4   8.6  Waveforms:               Clinical History: No specialty comments available.      Objective:  VS:  HT:    WT:   BMI:     BP:   HR: bpm  TEMP: ( )  RESP:  Physical Exam Musculoskeletal:        General: No swelling, tenderness or deformity.     Comments: Inspection reveals no atrophy of the bilateral APB or FDI or hand intrinsics. There is no swelling, color changes, allodynia or dystrophic changes. There is 5 out of 5 strength in the bilateral wrist extension, finger abduction and long finger flexion. There is intact sensation to light touch in all dermatomal and peripheral nerve distributions. There is a negative Froment's test bilaterally. There is a negative Tinel's test at the bilateral wrist and elbow. There is a positive Phalen's test bilaterally. There is a negative Hoffmann's test bilaterally.  Skin:    General: Skin is warm and dry.     Findings: No erythema or rash.  Neurological:     General: No focal deficit present.     Mental Status: She is alert and oriented to person, place, and time.     Motor: No weakness or abnormal muscle tone.     Coordination: Coordination normal.  Psychiatric:        Mood and Affect: Mood normal.        Behavior: Behavior normal.      Imaging: No results found.

## 2022-04-16 NOTE — Telephone Encounter (Signed)
I am not familiar with the RO website for which she mentions. Please let her know that the PA was started for [facility].

## 2022-04-29 ENCOUNTER — Ambulatory Visit (INDEPENDENT_AMBULATORY_CARE_PROVIDER_SITE_OTHER): Payer: Managed Care, Other (non HMO)

## 2022-04-29 ENCOUNTER — Ambulatory Visit: Payer: Managed Care, Other (non HMO) | Admitting: Orthopaedic Surgery

## 2022-04-29 DIAGNOSIS — M79644 Pain in right finger(s): Secondary | ICD-10-CM

## 2022-04-29 DIAGNOSIS — G5601 Carpal tunnel syndrome, right upper limb: Secondary | ICD-10-CM | POA: Diagnosis not present

## 2022-04-29 MED ORDER — DICLOFENAC SODIUM 75 MG PO TBEC: 75.0000 mg | DELAYED_RELEASE_TABLET | Freq: Two times a day (BID) | ORAL | 0 refills | Status: DC | PRN

## 2022-04-29 NOTE — Progress Notes (Signed)
Office Visit Note   Patient: Krista Hogan           Date of Birth: 11/23/1980           MRN: [id number] Visit Date: 04/29/2022              Requested by: Pleas Koch, NP [place],  [place] [address] PCP: Pleas Koch, NP   Assessment & Plan: Visit Diagnoses:  1. Thumb pain, right   2. Carpal tunnel syndrome, right upper limb     Plan: Impression is bilateral hand paresthesias with nerve conduction study confirming mild carpal tunnel syndrome on the right.  My guess is that she also has carpal tunnel syndrome on the left but has not gotten to the point where it can be detected on nerve conduction study.  We have discussed proceeding with right carpal tunnel injection today.  If she tolerates this well she may come back for left carpal tunnel injection.  She will otherwise follow-up as needed.  In regards to the right thumb, she may have a small sprain which I think will improve with time.  I have discussed picking up a tube of topical Voltaren and have also sent in oral Voltaren to take as needed.  Follow-up as needed.  Follow-Up Instructions: Return if symptoms worsen or fail to improve.   Orders:  Orders Placed This Encounter  Procedures   XR Finger Thumb Right   Meds ordered this encounter  Medications   diclofenac (VOLTAREN) 75 MG EC tablet    Sig: Take 1 tablet (75 mg total) by mouth 2 (two) times daily as needed.    Dispense:  60 tablet    Refill:  0      Procedures: No procedures performed   Clinical Data: No additional findings.   Subjective: Chief Complaint  Patient presents with   Right Hand - Pain   Left Hand - Pain    HPI patient is a pleasant 42 year old female who comes in today to discuss nerve conduction study bilateral upper extremity.  She has been complaining of paresthesias and pain throughout the hands right greater than left.  Recent nerve conduction study from 04/09/2022 shows a mild compression of median nerve on  the right.  Left is unremarkable.  Patient has tried night splinting which has been of some benefit.  No previous cortisone injection to either carpal tunnel.  Other issue she brings up today is right thumb pain.  She misjudged the distance of pushing her recliner and felt as though her right thumb hyperextended.  This occurred about a week ago.  She has had pain throughout the IP joint since.  Review of Systems as detailed in HPI.  All others reviewed are negative.   Objective: Vital Signs: There were no vitals taken for this visit.  Physical Exam well-developed well-nourished female no acute distress.  Alert and oriented x 3.  Ortho Exam unchanged bilateral hand exam with the exception of the thumb.  The thumb on the right hand does have mild swelling.  She has slight tenderness to the radial side of the IP joint.  Collaterals are stable.  No tenderness to the first [facility] joint or to the snuffbox.  Near full range of motion.  She is neurovascular intact distally.  Specialty Comments:  No specialty comments available.  Imaging: XR Finger Thumb Right  Result Date: 04/29/2022 No acute or structural abnormalities    PMFS History: Patient Active Problem List  Diagnosis Date Noted   Wrist pain, chronic, right 02/27/2022   Chronic foot pain, right 02/27/2022   Chronic idiopathic constipation 03/29/2020   Acne vulgaris 11/02/2019   Hyperpigmentation 11/02/2019   Gastroesophageal reflux disease without esophagitis 08/29/2019   Globus pharyngeus 08/29/2019   Nasal turbinate hypertrophy 08/29/2019   Rhinitis, chronic 08/29/2019   Dysesthesia 02/01/2019   Class 2 obesity due to excess calories with body mass index (BMI) of 38.0 to 38.9 in adult 02/01/2019   Graves disease 10/01/2016   Genital herpes simplex 07/29/2016   Genital warts 07/28/2016   Migraines 07/28/2016   Mucosal irritation of oral cavity 12/17/2015   Chronic pelvic pain in female 11/15/2015   Submucous leiomyoma of uterus  12/15/2013   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   Irritant dermatitis 03/21/2020   Palpitations 02/25/2019   Postpartum hypertension 01/10/2018   S/P repeat low transverse C-section 01/06/2018   Uterine fibroid    Wears glasses     Family History  Problem Relation Age of Onset   Fibroids Mother    Thyroid disease Neg Hx     Past Surgical History:  Procedure Laterality Date   CESAREAN SECTION N/A 01/06/2018   Procedure: CESAREAN SECTION;  Surgeon: Caren Macadam, MD;  Location: [place];  Service: Obstetrics;  Laterality: N/A;   EXCISION OF SKIN TAG  11/22/2014   Procedure: EXCISION OF SKIN TAG VULVA;  Surgeon: Governor Specking, MD;  Location: [place];  Service: Gynecology;;   LAPAROSCOPIC GELPORT ASSISTED MYOMECTOMY N/A 11/22/2014   Procedure: LAPAROSCOPIC GELPORT ASSISTED MYOMECTOMY WITH CHROMOTUBATION;  Surgeon: Governor Specking, MD;  Location: [place];  Service: Gynecology;  Laterality: N/A;   LAPAROSCOPY N/A 11/22/2014   Procedure: LAPAROSCOPY DIAGNOSTIC;  Surgeon: Governor Specking, MD;  Location: [place];  Service: Gynecology;  Laterality: N/A;   LEEP  2011   TONSILLECTOMY AND ADENOIDECTOMY  age 7   WISDOM TOOTH EXTRACTION  2006   Social History   Occupational History   Not on file  Tobacco Use   Smoking status: Every Day    Packs/day: 0.25    Years: 10.00    Total pack years: 2.50    Types: Cigarettes    Last attempt to quit: 04/2017    Years since quitting: 5.0   Smokeless tobacco: Never  Vaping Use   Vaping Use: Never used  Substance and Sexual Activity   Alcohol use: Yes     Alcohol/week: 0.0 standard drinks of alcohol    Comment: SOCIAL   Drug use: No   Sexual activity: Yes    Partners: Male    Birth control/protection: Pill, None

## 2022-04-30 ENCOUNTER — Telehealth: Payer: Self-pay | Admitting: Orthopaedic Surgery

## 2022-04-30 ENCOUNTER — Other Ambulatory Visit: Payer: Self-pay | Admitting: Physician Assistant

## 2022-04-30 ENCOUNTER — Telehealth: Payer: Self-pay

## 2022-04-30 MED ORDER — PREDNISONE 10 MG (21) PO TBPK: ORAL_TABLET | ORAL | 0 refills | Status: DC

## 2022-04-30 MED ORDER — HYDROCODONE-ACETAMINOPHEN 5-325 MG PO TABS: 1.0000 | ORAL_TABLET | Freq: Two times a day (BID) | ORAL | 0 refills | Status: DC | PRN

## 2022-04-30 NOTE — Telephone Encounter (Signed)
I sent in a small rx for norco and a sterapred taper.  A small percent of people can have increased pain for a few days following injection.  Let us know by the end of the week if it is not any  better

## 2022-04-30 NOTE — Telephone Encounter (Signed)
Called and notified patient.

## 2022-04-30 NOTE — Telephone Encounter (Signed)
Patient called stating that she is in a lot of pain with her right hand and that she can barely open it or use it for anything.  Would like to know if this is normal after receiving a cortisone injection?  Cb# [phone].  Please advise.  Thank you

## 2022-04-30 NOTE — Telephone Encounter (Signed)
Patient called she can not open her hand. Says she had the cortisone shot yesterday. Would like a call. [phone]

## 2022-04-30 NOTE — Telephone Encounter (Signed)
Left vm.  If she calls back, can you see if she cannot open bc of pain or if it just will not open bc of motor function?  Also, any numbness/tingling/burning or pain?

## 2022-05-01 ENCOUNTER — Telehealth: Payer: Self-pay | Admitting: Orthopaedic Surgery

## 2022-05-01 NOTE — Telephone Encounter (Signed)
Patient states her note states she can return to work, she is stating that her hand is hurting and she works with her hands and if she is unable to work tomorrow night can someone give an work note in case she is unable to work?

## 2022-05-01 NOTE — Telephone Encounter (Signed)
Yes thank you

## 2022-05-01 NOTE — Telephone Encounter (Signed)
Patient called asked if she can get a return to work note? Patient said she can print it off of mychart. Patient said she will return back to work tomorrow. The number to contact patient is [phone]

## 2022-05-05 ENCOUNTER — Telehealth: Payer: Self-pay | Admitting: Orthopaedic Surgery

## 2022-05-05 ENCOUNTER — Telehealth: Payer: Self-pay

## 2022-05-05 NOTE — Telephone Encounter (Signed)
Patient called triage. She received a carpal tunnel injection almost a week ago, on Feb 20th. She wants a note to keep her out of work until this Friday March 1st. Please advise.

## 2022-05-05 NOTE — Telephone Encounter (Signed)
Called patient. Waiting on Dr.Xu to advise. Patient wants note sent to her MyChart account.

## 2022-05-05 NOTE — Telephone Encounter (Signed)
sure 

## 2022-05-05 NOTE — Telephone Encounter (Signed)
Patient ask for a technician to call her about a change on her Dr.s note work change the date

## 2022-05-05 NOTE — Telephone Encounter (Signed)
No I don't think I can justify that.  Sorry.  I'm happy to say she could stay out of work for the day she got the injection and the day afterward but not for 9-10 days.  Thanks.

## 2022-05-06 NOTE — Telephone Encounter (Signed)
Done and sent to patient's MyChart

## 2022-05-06 NOTE — Telephone Encounter (Signed)
Notified patient.

## 2022-05-13 NOTE — Telephone Encounter (Signed)
Patient called in and stated that her insurance will not cover Zepbound and she would like to go back to [facility]. She would like for [facility] to [facility]. Please advise. Thank you!

## 2022-05-13 NOTE — Telephone Encounter (Signed)
Did she ever start Wegovy? For example, not currently taking, right?  If not currently taking  Start by injecting 0.25 mg into the skin once weekly for 4 weeks, then increase to 0.5 mg once weekly thereafter. Please notify me once you've used your last 0.25 mg pen so that I can prescribe the next dose.

## 2022-05-14 MED ORDER — SEMAGLUTIDE-WEIGHT MANAGEMENT 0.25 MG/0.5ML ~~LOC~~ SOAJ: 0.2500 mg | SUBCUTANEOUS | 0 refills | Status: DC

## 2022-05-14 NOTE — Addendum Note (Signed)
Addended by: Pleas Koch on: 05/14/2022 12:40 PM   Modules accepted: Orders

## 2022-05-14 NOTE — Telephone Encounter (Signed)
Noted, Rx sent to pharmacy. 

## 2022-05-14 NOTE — Telephone Encounter (Signed)
Called and spoke with patient, she states she is not currently on the Wegovy. She stated she started it a while back with the first dose (0.25 mg) and then it became backordered and she could not longer get it.   Gave instructions on dosage, she will notify us when she has used her last 0.25 mg pen. She would [facility] on [address]

## 2022-05-27 NOTE — Telephone Encounter (Signed)
Patient would like to speak to someone regarding this.She is very upset that the wrong check mark was checked when the prior authorization was sent ,which caused her to be denied. Now she has to do an appeal which can take up to 72 hours and she said that the pharmacy will put the medication back,and it will not be available to her.

## 2022-05-27 NOTE — Telephone Encounter (Signed)
Received denial notification from [facility].  Submitted Appeal per pt request. See MyChart encounter for further documentation

## 2022-05-27 NOTE — Telephone Encounter (Addendum)
   PA has been submitted, awaiting determination  Patient has been made aware, will update as soon as we get the determination.

## 2022-05-27 NOTE — Telephone Encounter (Signed)
Patient would like the status of PA for [facility]. She stated that today is the last day for the medication to be picked up from the pharmacy. She would like to know the status of this.

## 2022-06-04 ENCOUNTER — Other Ambulatory Visit: Payer: Self-pay | Admitting: Primary Care

## 2022-06-04 DIAGNOSIS — Z6838 Body mass index (BMI) 38.0-38.9, adult: Secondary | ICD-10-CM

## 2022-06-04 DIAGNOSIS — E6609 Other obesity due to excess calories: Secondary | ICD-10-CM

## 2022-06-04 NOTE — Telephone Encounter (Signed)
From: [name] To: Office of Pleas Koch, NP Sent: 06/04/2022 1:14 AM EDT Subject: Medication Renewal Request  Refills have been requested for the following medications:   Semaglutide-Weight Management 0.25 MG/0.5ML SOAJ [Jaxsen Bernhart K Lennis Korb]  Patient Comment: I would like the next dose so I can find it  Preferred pharmacy: [facility] [place] Delivery method: Arlyss Gandy

## 2022-06-08 ENCOUNTER — Other Ambulatory Visit (HOSPITAL_COMMUNITY): Payer: Self-pay

## 2022-06-08 MED ORDER — WEGOVY 0.5 MG/0.5ML ~~LOC~~ SOAJ
0.5000 mg | SUBCUTANEOUS | 0 refills | Status: DC
  Filled 2022-06-08: qty 2, 28d supply, fill #0

## 2022-06-09 ENCOUNTER — Other Ambulatory Visit (HOSPITAL_COMMUNITY): Payer: Self-pay

## 2022-06-10 ENCOUNTER — Ambulatory Visit: Payer: Managed Care, Other (non HMO) | Admitting: Orthopaedic Surgery

## 2022-06-10 ENCOUNTER — Other Ambulatory Visit (HOSPITAL_COMMUNITY): Payer: Self-pay

## 2022-06-17 ENCOUNTER — Other Ambulatory Visit (HOSPITAL_COMMUNITY): Payer: Self-pay

## 2022-06-17 ENCOUNTER — Ambulatory Visit: Payer: Managed Care, Other (non HMO) | Admitting: Orthopaedic Surgery

## 2022-06-17 ENCOUNTER — Other Ambulatory Visit (INDEPENDENT_AMBULATORY_CARE_PROVIDER_SITE_OTHER): Payer: Managed Care, Other (non HMO)

## 2022-06-17 ENCOUNTER — Encounter: Payer: Self-pay | Admitting: Orthopaedic Surgery

## 2022-06-17 DIAGNOSIS — M25562 Pain in left knee: Secondary | ICD-10-CM

## 2022-06-17 DIAGNOSIS — G5601 Carpal tunnel syndrome, right upper limb: Secondary | ICD-10-CM

## 2022-06-17 DIAGNOSIS — M25561 Pain in right knee: Secondary | ICD-10-CM | POA: Diagnosis not present

## 2022-06-17 DIAGNOSIS — G8929 Other chronic pain: Secondary | ICD-10-CM

## 2022-06-17 NOTE — Progress Notes (Signed)
Office Visit Note   Patient: Krista Hogan           Date of Birth: February 17, 1981           MRN: [id number] Visit Date: 06/17/2022              Requested by: Doreene Nest, NP [address] [place],  [place] [address] PCP: Doreene Nest, NP   Assessment & Plan: Visit Diagnoses:  1. Carpal tunnel syndrome, right upper limb   2. Chronic pain of both knees     Plan: Impression is 42 year old female with bilateral patellofemoral pain due to maltracking.  Disease process explained in detail.  Patient will work on Dance movement psychotherapist.  She declined formal PT referral.  Will see her back as needed.  Follow-Up Instructions: No follow-ups on file.   Orders:  Orders Placed This Encounter  Procedures   XR KNEE 3 VIEW LEFT   XR KNEE 3 VIEW RIGHT   No orders of the defined types were placed in this encounter.     Procedures: No procedures performed   Clinical Data: No additional findings.   Subjective: Chief Complaint  Patient presents with   Left Knee - Pain   Right Knee - Pain    HPI  Patient comes in today for evaluation of bilateral knee pain worse on the right.  Squatting and getting up makes it worse.  Denies any injuries.  Review of Systems  Constitutional: Negative.   HENT: Negative.    Eyes: Negative.   Respiratory: Negative.    Cardiovascular: Negative.   Endocrine: Negative.   Musculoskeletal: Negative.   Neurological: Negative.   Hematological: Negative.   Psychiatric/Behavioral: Negative.    All other systems reviewed and are negative.    Objective: Vital Signs: There were no vitals taken for this visit.  Physical Exam Vitals and nursing note reviewed.  Constitutional:      Appearance: She is well-developed.  HENT:     Head: Normocephalic and atraumatic.  Pulmonary:     Effort: Pulmonary effort is normal.  Abdominal:     Palpations: Abdomen is soft.  Musculoskeletal:     Cervical back: Neck supple.  Skin:    General: Skin is  warm.     Capillary Refill: Capillary refill takes less than 2 seconds.  Neurological:     Mental Status: She is alert and oriented to person, place, and time.  Psychiatric:        Behavior: Behavior normal.        Thought Content: Thought content normal.        Judgment: Judgment normal.     Ortho Exam  Examination of bilateral knees is nonfocal.  No joint effusion.  No range of motion.  Specialty Comments:  No specialty comments available.  Imaging: No results found.   PMFS History: Patient Active Problem List   Diagnosis Date Noted   Wrist pain, chronic, right 02/27/2022   Chronic foot pain, right 02/27/2022   Chronic idiopathic constipation 03/29/2020   Acne vulgaris 11/02/2019   Hyperpigmentation 11/02/2019   Gastroesophageal reflux disease without esophagitis 08/29/2019   Globus pharyngeus 08/29/2019   Nasal turbinate hypertrophy 08/29/2019   Rhinitis, chronic 08/29/2019   Dysesthesia 02/01/2019   Class 2 obesity due to excess calories with body mass index (BMI) of 38.0 to 38.9 in adult 02/01/2019   Graves disease 10/01/2016   Genital herpes simplex 07/29/2016   Genital warts 07/28/2016   Migraines 07/28/2016  Mucosal irritation of oral cavity 12/17/2015   Chronic pelvic pain in female 11/15/2015   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   Irritant dermatitis 03/21/2020   Palpitations 02/25/2019   Postpartum hypertension 01/10/2018   S/P repeat low transverse C-section 01/06/2018   Uterine fibroid    Wears glasses     Family History  Problem Relation Age of Onset   Fibroids Mother    Thyroid disease Neg Hx      Past Surgical History:  Procedure Laterality Date   CESAREAN SECTION N/A 01/06/2018   Procedure: CESAREAN SECTION;  Surgeon: Federico Flake, MD;  Location: [facility];  Service: Obstetrics;  Laterality: N/A;   EXCISION OF SKIN TAG  11/22/2014   Procedure: EXCISION OF SKIN TAG VULVA;  Surgeon: Fermin Schwab, MD;  Location: [facility];  Service: Gynecology;;   LAPAROSCOPIC GELPORT ASSISTED MYOMECTOMY N/A 11/22/2014   Procedure: LAPAROSCOPIC GELPORT ASSISTED MYOMECTOMY WITH CHROMOTUBATION;  Surgeon: Fermin Schwab, MD;  Location: [facility];  Service: Gynecology;  Laterality: N/A;   LAPAROSCOPY N/A 11/22/2014   Procedure: LAPAROSCOPY DIAGNOSTIC;  Surgeon: Fermin Schwab, MD;  Location: [facility];  Service: Gynecology;  Laterality: N/A;   LEEP  2011   TONSILLECTOMY AND ADENOIDECTOMY  age 83   WISDOM TOOTH EXTRACTION  2006   Social History   Occupational History   Not on file  Tobacco Use   Smoking status: Every Day    Packs/day: 0.25    Years: 10.00    Additional pack years: 0.00    Total pack years: 2.50    Types: Cigarettes    Last attempt to quit: 04/2017    Years since quitting: 5.1   Smokeless tobacco: Never  Vaping Use   Vaping Use: Never used  Substance and Sexual Activity   Alcohol use: Yes    Alcohol/week: 0.0 standard drinks of alcohol    Comment: SOCIAL   Drug use: No   Sexual activity: Yes    Partners: Male    Birth control/protection: Pill, None

## 2022-07-01 ENCOUNTER — Telehealth: Payer: Self-pay | Admitting: Primary Care

## 2022-07-01 DIAGNOSIS — E6609 Other obesity due to excess calories: Secondary | ICD-10-CM

## 2022-07-01 NOTE — Telephone Encounter (Signed)
Prescription Request  07/01/2022  LOV: 02/27/2022  What is the name of the medication or equipment? Semaglutide-Weight Management (WEGOVY) 0.5 MG/0.5ML SOAJ   Have you contacted your pharmacy to request a refill? No   Which pharmacy would you like this sent to?   [place] [facility] [address] [place] [address] Phone: [phone] Fax: [phone]    Patient notified that their request is being sent to the clinical staff for review and that they should receive a response within 2 business days.   Please advise at Mobile [phone] (mobile)

## 2022-07-01 NOTE — Telephone Encounter (Signed)
Please call patient:  How is she doing on the 0.5 mg dose of Wegovy? Does she want to increase the dose?  I received a refill request for the 0.5 mg dose.

## 2022-07-02 ENCOUNTER — Other Ambulatory Visit (HOSPITAL_COMMUNITY): Payer: Self-pay

## 2022-07-02 MED ORDER — WEGOVY 1 MG/0.5ML ~~LOC~~ SOAJ
1.0000 mg | SUBCUTANEOUS | 0 refills | Status: DC
Start: 2022-07-02 — End: 2022-07-29
  Filled 2022-07-02: qty 2, 28d supply, fill #0

## 2022-07-02 NOTE — Telephone Encounter (Signed)
Noted. Dose of Wegovy increased to 1 mg weekly.

## 2022-07-02 NOTE — Telephone Encounter (Signed)
Called and spoke to patient she states she is not having any issues with the 0.5mg  dose, no side effects. She states she is ready to increase her dose.

## 2022-07-10 ENCOUNTER — Other Ambulatory Visit (HOSPITAL_COMMUNITY): Payer: Self-pay

## 2022-07-28 ENCOUNTER — Telehealth: Payer: Self-pay | Admitting: Primary Care

## 2022-07-28 DIAGNOSIS — E6609 Other obesity due to excess calories: Secondary | ICD-10-CM

## 2022-07-28 NOTE — Telephone Encounter (Signed)
Prescription Request  07/28/2022  LOV: 02/27/2022  What is the name of the medication or equipment? Semaglutide-Weight Management (WEGOVY) 1 MG/0.5ML SOAJ   Have you contacted your pharmacy to request a refill? No   Which pharmacy would you like this sent to?   [place] [facility] [address] [place] [address] Phone: [phone] Fax: [phone]   Patient notified that their request is being sent to the clinical staff for review and that they should receive a response within 2 business days.   Please advise at Mobile [phone] (mobile)

## 2022-07-29 ENCOUNTER — Other Ambulatory Visit (HOSPITAL_COMMUNITY): Payer: Self-pay

## 2022-07-29 MED ORDER — WEGOVY 1 MG/0.5ML ~~LOC~~ SOAJ
1.0000 mg | SUBCUTANEOUS | 0 refills | Status: DC
Start: 2022-07-29 — End: 2022-08-26
  Filled 2022-07-29: qty 2, 28d supply, fill #0

## 2022-07-29 NOTE — Telephone Encounter (Signed)
Requested Prescriptions   Signed Prescriptions Disp Refills   Semaglutide-Weight Management (WEGOVY) 1 MG/0.5ML SOAJ 2 mL 0    Sig: Inject 1 mg into the skin once a week.    Authorizing Provider: Doreene Nest   Refill(s) sent to pharmacy.

## 2022-08-05 ENCOUNTER — Ambulatory Visit: Payer: Managed Care, Other (non HMO) | Admitting: Primary Care

## 2022-08-05 ENCOUNTER — Encounter: Payer: Self-pay | Admitting: Primary Care

## 2022-08-05 ENCOUNTER — Other Ambulatory Visit (HOSPITAL_COMMUNITY): Payer: Self-pay

## 2022-08-05 VITALS — BP 128/76 | HR 88 | Temp 98.3°F | Ht 63.0 in | Wt 220.0 lb

## 2022-08-05 DIAGNOSIS — E6609 Other obesity due to excess calories: Secondary | ICD-10-CM | POA: Diagnosis not present

## 2022-08-05 DIAGNOSIS — Z6838 Body mass index (BMI) 38.0-38.9, adult: Secondary | ICD-10-CM

## 2022-08-05 NOTE — Progress Notes (Signed)
Subjective:    Patient ID: Krista Hogan, female    DOB: [birth year], 42 y.o.   MRN: [id number]  HPI  Krista Hogan is a very pleasant 42 y.o. female with a history of migraines, chronic constipation, Graves' disease, chronic pelvic pain, class II obesity, chronic foot pain who presents today for follow up of obesity.  Currently managed on Wegovy 1 mg for which she's had two total doses. She has noticed mild nausea on day one of her injection, otherwise no bad side effects. She denies increased constipation.   She is not weighing herself at home. She has noticed her clothing is fitting better. She recently signed up for weight watchers which was required from her employer. She also plans on meal prepping. Her portion sizes have decreased.   Diet currently consists of:  Breakfast: Skips Lunch: Fast food 2-3 times weekly, chicken, fried meat Dinner: Salad, chicken  Snacks: Chips, fruit Desserts: On occasion  Beverages: Coffee, water, Ginger Ale, wine   Exercise: Floor exercises    Wt Readings from Last 3 Encounters:  08/05/22 220 lb (99.8 kg)  02/27/22 219 lb (99.3 kg)  12/18/21 214 lb (97.1 kg)   Body mass index is 38.97 kg/m.    Review of Systems  Constitutional:  Negative for fatigue.  Respiratory:  Negative for shortness of breath.   Cardiovascular:  Negative for chest pain.  Gastrointestinal:  Negative for abdominal pain and constipation.         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   Irritant dermatitis 03/21/2020   Palpitations 02/25/2019   Postpartum hypertension 01/10/2018   S/P repeat low transverse C-section 01/06/2018   Uterine fibroid    Wears  glasses     Social History   Socioeconomic History   Marital status: Single    Spouse name: Not on file   Number of children: Not on file   Years of education: Not on file   Highest education level: Not on file  Occupational History   Not on file  Tobacco Use   Smoking status: Every Day    Packs/day: 0.25    Years: 10.00    Additional pack years: 0.00    Total pack years: 2.50    Types: Cigarettes    Last attempt to quit: 04/2017    Years since quitting: 5.3   Smokeless tobacco: Never  Vaping Use   Vaping Use: Never used  Substance and Sexual Activity   Alcohol use: Yes    Alcohol/week: 0.0 standard drinks of alcohol    Comment: SOCIAL   Drug use: No   Sexual activity: Yes    Partners: Male    Birth control/protection: Pill, None  Other Topics Concern   Not on file  Social History Narrative   Not on file   Social Determinants of Health   Financial Resource Strain: Low Risk  (12/22/2017)   Overall Financial Resource Strain (CARDIA)    Difficulty of Paying Living Expenses: Not hard at all  Food Insecurity: No Food Insecurity (12/22/2017)   Hunger Vital Sign    Worried About Running Out of Food in the Last Year: Never  true    Ran Out of Food in the Last Year: Never true  Transportation Needs: Unknown (12/22/2017)   PRAPARE - Administrator, Civil Service (Medical): No    Lack of Transportation (Non-Medical): Not on file  Physical Activity: Not on file  Stress: Stress Concern Present (12/22/2017)   [employer] of Occupational Health - Occupational Stress Questionnaire    Feeling of Stress : To some extent  Social Connections: Not on file  Intimate Partner Violence: Not At Risk (12/22/2017)   Humiliation, Afraid, Rape, and Kick questionnaire    Fear of Current or Ex-Partner: No    Emotionally Abused: No    Physically Abused: No    Sexually Abused: No    Past Surgical History:  Procedure Laterality Date   CESAREAN SECTION N/A 01/06/2018    Procedure: CESAREAN SECTION;  Surgeon: Federico Flake, MD;  Location: [facility];  Service: Obstetrics;  Laterality: N/A;   EXCISION OF SKIN TAG  11/22/2014   Procedure: EXCISION OF SKIN TAG VULVA;  Surgeon: Fermin Schwab, MD;  Location: [facility];  Service: Gynecology;;   LAPAROSCOPIC GELPORT ASSISTED MYOMECTOMY N/A 11/22/2014   Procedure: LAPAROSCOPIC GELPORT ASSISTED MYOMECTOMY WITH CHROMOTUBATION;  Surgeon: Fermin Schwab, MD;  Location: [facility];  Service: Gynecology;  Laterality: N/A;   LAPAROSCOPY N/A 11/22/2014   Procedure: LAPAROSCOPY DIAGNOSTIC;  Surgeon: Fermin Schwab, MD;  Location: [facility];  Service: Gynecology;  Laterality: N/A;   LEEP  2011   TONSILLECTOMY AND ADENOIDECTOMY  age 60   WISDOM TOOTH EXTRACTION  2006    Family History  Problem Relation Age of Onset   Fibroids Mother    Thyroid disease Neg Hx     Allergies  Allergen Reactions   Minocycline Rash    Current Outpatient Medications on File Prior to Visit  Medication Sig Dispense Refill   cetirizine (ZYRTEC) 10 MG tablet Take 1 tablet by mouth daily.     cholecalciferol (VITAMIN D) 1000 units tablet Take 1,000 Units by mouth daily.     ibuprofen (ADVIL) 800 MG tablet TAKE 1 TABLET BY MOUTH EVERY 8 HOURS AS NEEDED 30 tablet 5   Omega-3 Fatty Acids (FISH OIL) 1000 MG CAPS Take 1,000 mg by mouth daily.      Semaglutide-Weight Management (WEGOVY) 1 MG/0.5ML SOAJ Inject 1 mg into the skin once a week. 2 mL 0   spironolactone (ALDACTONE) 50 MG tablet Take 1 tablet by mouth daily.     tretinoin (RETIN-A) 0.025 % cream      Current Facility-Administered Medications on File Prior to Visit  Medication Dose Route Frequency Provider Last Rate Last Admin   triamcinolone acetonide (KENALOG) 10 MG/ML injection 10 mg  10 mg Other Once Stover, Titorya, DPM        BP 128/76   Pulse 88   Temp 98.3 F (36.8 C) (Temporal)   Ht 5\' 3"  (1.6 m)   Wt 220  lb (99.8 kg)   LMP 07/28/2022 (Exact Date)   SpO2 98%   Breastfeeding No   BMI 38.97 kg/m  Objective:   Physical Exam Cardiovascular:     Rate and Rhythm: Normal rate and regular rhythm.  Pulmonary:     Effort: Pulmonary effort is normal.     Breath sounds: Normal breath sounds.  Musculoskeletal:     Cervical back: Neck supple.  Skin:    General: Skin is warm and dry.  Assessment & Plan:  Class 2 obesity due to excess calories without serious comorbidity with body mass index (BMI) of 38.0 to 38.9 in adult Assessment & Plan: Commended her on joining weight watchers.  Encouraged to work on her diet, limit fast food. Start meal prepping. Continue to work on exercises.  Continue Wegovy 1 mg weekly. Increase to 1.7 mg weekly for next refill.  Follow up in 3 months.         Doreene Nest, NP

## 2022-08-05 NOTE — Patient Instructions (Addendum)
Cut back on fast food and fried food.  Start the [employer] Watchers program.  Please schedule a physical to meet with me in 3 months.   It was a pleasure to see you today!

## 2022-08-05 NOTE — Assessment & Plan Note (Signed)
Commended her on joining weight watchers.  Encouraged to work on her diet, limit fast food. Start meal prepping. Continue to work on exercises.  Continue Wegovy 1 mg weekly. Increase to 1.7 mg weekly for next refill.  Follow up in 3 months.

## 2022-08-25 DIAGNOSIS — E6609 Other obesity due to excess calories: Secondary | ICD-10-CM

## 2022-08-26 ENCOUNTER — Other Ambulatory Visit (HOSPITAL_COMMUNITY): Payer: Self-pay

## 2022-08-26 MED ORDER — WEGOVY 1.7 MG/0.75ML ~~LOC~~ SOAJ
1.7000 mg | SUBCUTANEOUS | 0 refills | Status: DC
Start: 2022-08-26 — End: 2022-09-26
  Filled 2022-08-26 – 2022-09-04 (×2): qty 3, 28d supply, fill #0

## 2022-08-28 ENCOUNTER — Ambulatory Visit: Payer: Managed Care, Other (non HMO) | Admitting: Internal Medicine

## 2022-08-28 NOTE — Progress Notes (Deleted)
Patient ID: Krista Hogan, female   DOB: June 01, 1980, 42 y.o.   MRN: [id number]   HPI  Krista Hogan is a 42 y.o.-year-old female, returning for f/u for Graves ds. She previously saw Dr. Everardo All, last visit 11/2015. Last visit with me 1 year ago.  Interim hx: At last visit she was on phentermine but she started [facility] afterwards. She continues to have blurry vision. At last visit she had some heat intolerance but this resolved.  No palpitations, unintentional weight loss, tremors.  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 She was Rx'ed MMI >> initially refused 2/2 concern for poss. weight gain   After I saw her, we checked labs which confirmed Graves' disease so he started the low-dose methimazole, 2.5 mg daily.  She initially developed headaches, but then she realized that this could have been due to allergies.  Due to the plans for pregnancy, we switched to a low-dose PTU, 50 mg daily.  We did discuss at that time that at the beginning of the second trimester, she will need to switch back to methimazole, but she did not contact me when she entered the second trimester, therefore, she continued PTU until set of labs in 09/2017.    Since the tests were excellent, we stopped PTU at that time.  She did well during the pregnancy but she was lost for follow-up between 10/2017-08/2018.  In 08/2018, labs were normal.    In 02/2019, TSH was slightly low and I advised her to restart methimazole 2.5 mg daily in 02/2019. She did not start as she forgot.  Afterwards, TFTs normalized so we did not have to restart methimazole.  She takes Biotin intermittently (B Complex).  Review her TFTs: Lab Results  Component Value Date   TSH 0.461 02/27/2022   TSH 0.77 08/15/2021   TSH 0.66 08/16/2020   TSH 1.15 08/22/2019   TSH 0.445 (L) 02/24/2019   TSH 0.44 08/23/2018   TSH 1.17 11/02/2017   TSH 1.28 10/07/2017   TSH 0.39 06/25/2017   TSH 0.50 01/01/2017   FREET4 1.10  02/27/2022   FREET4 0.86 08/15/2021   FREET4 0.87 08/16/2020   FREET4 0.83 08/22/2019   FREET4 1.24 02/24/2019   FREET4 0.83 08/23/2018   FREET4 0.65 11/02/2017   FREET4 0.62 10/07/2017   FREET4 0.72 06/25/2017   FREET4 0.73 01/01/2017  08/2016: TSH 0.011 (received after appt)  We diagnosed Graves' disease based on the disease course and elevated TSI antibodies.  These normalized at last check: Lab Results  Component Value Date   TSI 126 11/02/2017   TSI 273 (H) 09/23/2016   Pt denies: - feeling nodules in neck - hoarseness - choking - SOB with lying down But she has occasional dysphagia.  Pt does have a FH of thyroid ds. In cousin  No FH of thyroid cancer. No h/o radiation tx to head or neck.  + Biotin use. No recent steroids use.   Pt. also has a history of myomectomy. She has a h/o capsulitis in her foot and was in physical therapy. She gave birth to a healthy baby in 12/2017.  ROS: + see HPI  I reviewed pt's medications, allergies, PMH, social hx, family hx, and changes were documented in the history of present illness. Otherwise, unchanged from my initial visit note.  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   Irritant dermatitis 03/21/2020   Palpitations 02/25/2019   Postpartum hypertension 01/10/2018   S/P repeat low transverse C-section 01/06/2018   Uterine fibroid    Wears glasses    Past Surgical History:  Procedure Laterality Date   CESAREAN SECTION N/A 01/06/2018   Procedure: CESAREAN SECTION;  Surgeon: Federico Flake, MD;  Location: [facility];  Service: Obstetrics;  Laterality: N/A;   EXCISION OF SKIN TAG  11/22/2014   Procedure: EXCISION  OF SKIN TAG VULVA;  Surgeon: Fermin Schwab, MD;  Location: [facility];  Service: Gynecology;;   LAPAROSCOPIC GELPORT ASSISTED MYOMECTOMY N/A 11/22/2014   Procedure: LAPAROSCOPIC GELPORT ASSISTED MYOMECTOMY WITH CHROMOTUBATION;  Surgeon: Fermin Schwab, MD;  Location: [facility];  Service: Gynecology;  Laterality: N/A;   LAPAROSCOPY N/A 11/22/2014   Procedure: LAPAROSCOPY DIAGNOSTIC;  Surgeon: Fermin Schwab, MD;  Location: [facility];  Service: Gynecology;  Laterality: N/A;   LEEP  2011   TONSILLECTOMY AND ADENOIDECTOMY  age 42   WISDOM TOOTH EXTRACTION  2006   Social History   Social History   Marital status: Single    Spouse name: N/A   Number of children: 0   Occupational History   Lab tech   Social History Main Topics   Smoking status: Current Some Day Smoker    Packs/day: 0.25    Years: 10.00    Types: Cigarettes   Smokeless tobacco: Never Used   Alcohol use 0.0 oz/week     Comment: SOCIAL   Drug use: No   Sexual activity: Yes    Birth control/ protection: None   Current Outpatient Medications on File Prior to Visit  Medication Sig Dispense Refill   cetirizine (ZYRTEC) 10 MG tablet Take 1 tablet by mouth daily.     cholecalciferol (VITAMIN D) 1000 units tablet Take 1,000 Units by mouth daily.     ibuprofen (ADVIL) 800 MG tablet TAKE 1 TABLET BY MOUTH EVERY 8 HOURS AS NEEDED 30 tablet 5   Omega-3 Fatty Acids (FISH OIL) 1000 MG CAPS Take 1,000 mg by mouth daily.      Semaglutide-Weight Management (WEGOVY) 1.7 MG/0.75ML SOAJ Inject 1.7 mg into the skin once a week. 3 mL 0   spironolactone (ALDACTONE) 50 MG tablet Take 1 tablet by mouth daily.     tretinoin (RETIN-A) 0.025 % cream      Current Facility-Administered Medications on File Prior to Visit  Medication Dose Route Frequency Provider Last Rate Last Admin   triamcinolone acetonide (KENALOG) 10 MG/ML injection 10 mg  10 mg Other Once Asencion Islam, DPM        Allergies  Allergen Reactions   Minocycline Rash   Family History  Problem Relation Age of Onset   Fibroids Mother    Thyroid disease Neg Hx    PE: LMP 07/28/2022 (Exact Date)  Wt Readings from Last 3 Encounters:  08/05/22 220 lb (99.8 kg)  02/27/22 219 lb (99.3 kg)  12/18/21 214 lb (97.1 kg)   Constitutional: overweight, in NAD Eyes: EOMI, no exophthalmos ENT: + mild symmetric thyromegaly, no cervical lymphadenopathy Cardiovascular: RRR, No MRG Respiratory: CTA B Musculoskeletal: no deformities,  Skin: no rashes Neurological: no tremor with outstretched hands  ASSESSMENT: 1. Graves ds  PLAN:  1. Patient with history of  Graves' disease, diagnosed based on the course of the disease and elevated TSI antibodies.  We skipped the thyroid uptake and scan for diagnosis -We initially had her on methimazole and then changed to PTU before her pregnancy.  She did not require medication during pregnancy.  She was then lost for follow-up in 2019 and returned in 02/2019.  At that time, I sent her a message through MyChart to restart low-dose methimazole, 2.5 mg daily, as her TSH was suppressed.  She did not see the message and returns to the clinic in 08/2019, still off methimazole.  However, TFTs were normal so we ended up staying off methimazole -She did have occasional palpitations previously, but these are chronic for her -At last visit, she had no tremors, unintentional weight loss, palpitations, only more heat intolerance, possibly related to phentermine.  She had normal TFTs at that time so we did not restart methimazole -At this visit, she has no thyrotoxic signs or symptoms.  She had some heat intolerance at last visit, possibly related to phentermine but she stopped the medication since.  Now on Wegovy. -She has no evidence of Graves' ophthalmopathy: No double vision, eye pain, chemosis.  She has some blurry vision, possibly related to contacts or dry eyes.  She sees her  ophthalmologist regularly. -Today's visit we will repeat her TFTs.  Latest TSI's were normal and we will not repeat them today -I will see her back in 1 year but with labs at 6 months or sooner if the results are abnormal today  Carlus Pavlov, MD PhD [facility] Endocrinology

## 2022-09-01 ENCOUNTER — Encounter: Payer: Self-pay | Admitting: Internal Medicine

## 2022-09-01 ENCOUNTER — Ambulatory Visit: Payer: Managed Care, Other (non HMO) | Admitting: Internal Medicine

## 2022-09-01 VITALS — BP 112/60 | HR 83 | Ht 63.0 in | Wt 221.2 lb

## 2022-09-01 DIAGNOSIS — E0500 Thyrotoxicosis with diffuse goiter without thyrotoxic crisis or storm: Secondary | ICD-10-CM

## 2022-09-01 LAB — T3, FREE: T3, Free: 4.2 pg/mL (ref 2.3–4.2)

## 2022-09-01 LAB — T4, FREE: Free T4: 0.8 ng/dL (ref 0.60–1.60)

## 2022-09-01 LAB — TSH: TSH: 0.83 u[IU]/mL (ref 0.35–5.50)

## 2022-09-01 NOTE — Patient Instructions (Signed)
Please stop at the lab. ? ?Please come back for a follow-up appointment in 1 year but for labs in 6 months. ? ?

## 2022-09-01 NOTE — Progress Notes (Unsigned)
Patient ID: Krista Hogan, female   DOB: 1980/07/13, 42 y.o.   MRN: [id number]   HPI  Krista Hogan is a 42 y.o.-year-old female, returning for f/u for Graves ds. She previously saw Dr. Everardo All, last visit 11/2015. Last visit with me 1 year ago.  Interim hx: At last visit she was on phentermine but she started [facility] afterwards. She continues to have blurry vision - she thinks she needs new contacts. Has an eye exam coming. She continues to have heat intolerance -running.  No palpitations, unintentional weight loss, tremors.  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 She was Rx'ed MMI >> initially refused 2/2 concern for poss. weight gain   After I saw her, we checked labs which confirmed Graves' disease so he started the low-dose methimazole, 2.5 mg daily.  She initially developed headaches, but then she realized that this could have been due to allergies.  Due to the plans for pregnancy, we switched to a low-dose PTU, 50 mg daily.  We did discuss at that time that at the beginning of the second trimester, she will need to switch back to methimazole, but she did not contact me when she entered the second trimester, therefore, she continued PTU until set of labs in 09/2017.    Since the tests were excellent, we stopped PTU at that time.  She did well during the pregnancy but she was lost for follow-up between 10/2017-08/2018.  In 08/2018, labs were normal.    In 02/2019, TSH was slightly low and I advised her to restart methimazole 2.5 mg daily in 02/2019. She did not start as she forgot.  Afterwards, TFTs normalized so we did not have to restart methimazole.  She takes Biotin intermittently (B Complex).  Review her TFTs: Lab Results  Component Value Date   TSH 0.461 02/27/2022   TSH 0.77 08/15/2021   TSH 0.66 08/16/2020   TSH 1.15 08/22/2019   TSH 0.445 (L) 02/24/2019   TSH 0.44 08/23/2018   TSH 1.17 11/02/2017   TSH 1.28 10/07/2017   TSH 0.39  06/25/2017   TSH 0.50 01/01/2017   FREET4 1.10 02/27/2022   FREET4 0.86 08/15/2021   FREET4 0.87 08/16/2020   FREET4 0.83 08/22/2019   FREET4 1.24 02/24/2019   FREET4 0.83 08/23/2018   FREET4 0.65 11/02/2017   FREET4 0.62 10/07/2017   FREET4 0.72 06/25/2017   FREET4 0.73 01/01/2017  08/2016: TSH 0.011 (received after appt)  We diagnosed Graves' disease based on the disease course and elevated TSI antibodies.  These normalized at last check: Lab Results  Component Value Date   TSI 126 11/02/2017   TSI 273 (H) 09/23/2016   Pt denies: - feeling nodules in neck - hoarseness - choking - SOB with lying down But she has occasional dysphagia.  Pt does have a FH of thyroid ds. In cousin  No FH of thyroid cancer. No h/o radiation tx to head or neck.  + Biotin use, but last dose was more than 1 week ago. No recent steroids use.   Pt. also has a history of myomectomy. She has a h/o capsulitis in her foot and was in physical therapy. She gave birth to a healthy baby in 12/2017.  ROS: + see HPI  I reviewed pt's medications, allergies, PMH, social hx, family hx, and changes were documented in the history of present illness. Otherwise, unchanged from my initial visit note.  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   Irritant dermatitis 03/21/2020   Palpitations 02/25/2019   Postpartum hypertension 01/10/2018   S/P repeat low transverse C-section 01/06/2018   Uterine fibroid    Wears glasses    Past Surgical History:  Procedure Laterality Date   CESAREAN SECTION N/A 01/06/2018   Procedure: CESAREAN SECTION;  Surgeon: Federico Flake, MD;  Location: [facility];  Service:  Obstetrics;  Laterality: N/A;   EXCISION OF SKIN TAG  11/22/2014   Procedure: EXCISION OF SKIN TAG VULVA;  Surgeon: Fermin Schwab, MD;  Location: [facility];  Service: Gynecology;;   LAPAROSCOPIC GELPORT ASSISTED MYOMECTOMY N/A 11/22/2014   Procedure: LAPAROSCOPIC GELPORT ASSISTED MYOMECTOMY WITH CHROMOTUBATION;  Surgeon: Fermin Schwab, MD;  Location:  [place];  Service: Gynecology;  Laterality: N/A;   LAPAROSCOPY N/A 11/22/2014   Procedure: LAPAROSCOPY DIAGNOSTIC;  Surgeon: Fermin Schwab, MD;  Location: [facility] ;  Service: Gynecology;  Laterality: N/A;   LEEP  2011   TONSILLECTOMY AND ADENOIDECTOMY  age 42   WISDOM TOOTH EXTRACTION  2006   Social History   Social History   Marital status: Single    Spouse name: N/A   Number of children: 0   Occupational History   Lab tech   Social History Main Topics   Smoking status: Current Some Day Smoker    Packs/day: 0.25    Years: 10.00    Types: Cigarettes   Smokeless tobacco: Never Used   Alcohol use 0.0 oz/week     Comment: SOCIAL   Drug use: No   Sexual activity: Yes    Birth control/ protection: None   Current Outpatient Medications on File Prior to Visit  Medication Sig Dispense Refill   cetirizine (ZYRTEC) 10 MG tablet Take 1 tablet by mouth daily.     cholecalciferol (VITAMIN D) 1000 units tablet Take 1,000 Units by mouth daily.     ibuprofen (ADVIL) 800 MG tablet TAKE 1 TABLET BY MOUTH EVERY 8 HOURS AS NEEDED 30 tablet 5   Omega-3 Fatty Acids (FISH OIL) 1000 MG CAPS Take 1,000 mg by mouth daily.      Semaglutide-Weight Management (WEGOVY) 1.7 MG/0.75ML SOAJ Inject 1.7 mg into the skin once a week. 3 mL 0   spironolactone (ALDACTONE) 50 MG tablet Take 1 tablet by mouth daily.     tretinoin (RETIN-A) 0.025 % cream      Current Facility-Administered Medications on File Prior to Visit  Medication Dose Route Frequency Provider Last Rate Last Admin   triamcinolone  acetonide (KENALOG) 10 MG/ML injection 10 mg  10 mg Other Once Asencion Islam, DPM       Allergies  Allergen Reactions   Minocycline Rash   Family History  Problem Relation Age of Onset   Fibroids Mother    Thyroid disease Neg Hx    PE: BP 112/60   Pulse 83   Ht 5\' 3"  (1.6 m)   Wt 221 lb 3.2 oz (100.3 kg)   LMP 07/28/2022 (Exact Date)   SpO2 97%   BMI 39.18 kg/m  Wt Readings from Last 3 Encounters:  09/01/22 221 lb 3.2 oz (100.3 kg)  08/05/22 220 lb (99.8 kg)  02/27/22 219  lb (99.3 kg)   Constitutional: overweight, in NAD Eyes: EOMI, no exophthalmos ENT: + mild symmetric thyromegaly, no cervical lymphadenopathy Cardiovascular: RRR, No MRG Respiratory: CTA B Musculoskeletal: no deformities,  Skin: no rashes Neurological: no tremor with outstretched hands  ASSESSMENT: 1. Graves ds  PLAN:  1. Patient with history of Graves' disease, diagnosed based on the course of the disease and elevated TSI antibodies.  We skipped the thyroid uptake and scan for diagnosis -We initially had her on methimazole and then changed to PTU before her pregnancy.  She did not require medication during pregnancy.  She was then lost for follow-up in 2019 and returned in 02/2019.  At that time, I sent her a message through MyChart to restart low-dose methimazole, 2.5 mg daily, as her TSH was suppressed.  She did not see the message and returned to the clinic in 08/2019, still off methimazole.  However, TFTs were normal so we ended up staying off methimazole. -She did have occasional palpitations previously, chronic for her -At last visit, she had no tremors, unintentional weight loss, palpitations, only more heat intolerance, possibly related to phentermine.  She had normal TFTs at that time so we did not restart methimazole -At this visit, she has no thyrotoxic signs or symptoms other than heat intolerance, but this is chronic for her.  She is currently off phentermine.  Continues on Wegovy. -no  evidence of active Graves' ophthalmopathy: No double vision, eye pain, chemosis.  He has some blurry vision, possibly related to contact or dry eyes.  She sees her ophthalmologist regularly -exam coming up -Will repeat her TFTs today.  Latest TSI's were normal so we will not repeat them today -I see her back in a year but with labs in 6 months or sooner if the results are abnormal today  Carlus Pavlov, MD PhD [facility] Endocrinology

## 2022-09-02 ENCOUNTER — Other Ambulatory Visit (HOSPITAL_COMMUNITY): Payer: Self-pay

## 2022-09-04 ENCOUNTER — Other Ambulatory Visit (HOSPITAL_COMMUNITY): Payer: Self-pay

## 2022-09-25 DIAGNOSIS — E6609 Other obesity due to excess calories: Secondary | ICD-10-CM

## 2022-09-26 ENCOUNTER — Other Ambulatory Visit (HOSPITAL_COMMUNITY): Payer: Self-pay

## 2022-09-26 MED ORDER — WEGOVY 1.7 MG/0.75ML ~~LOC~~ SOAJ
1.7000 mg | SUBCUTANEOUS | 0 refills | Status: DC
Start: 2022-09-26 — End: 2022-10-29
  Filled 2022-09-26: qty 3, 28d supply, fill #0

## 2022-10-23 ENCOUNTER — Encounter (INDEPENDENT_AMBULATORY_CARE_PROVIDER_SITE_OTHER): Payer: Self-pay

## 2022-10-29 ENCOUNTER — Encounter: Payer: Self-pay | Admitting: Primary Care

## 2022-10-29 ENCOUNTER — Telehealth: Payer: Self-pay | Admitting: Primary Care

## 2022-10-29 ENCOUNTER — Other Ambulatory Visit (HOSPITAL_COMMUNITY): Payer: Self-pay

## 2022-10-29 DIAGNOSIS — E6609 Other obesity due to excess calories: Secondary | ICD-10-CM

## 2022-10-29 MED ORDER — WEGOVY 1.7 MG/0.75ML ~~LOC~~ SOAJ
1.7000 mg | SUBCUTANEOUS | 0 refills | Status: DC
Start: 2022-10-29 — End: 2022-11-26
  Filled 2022-10-29: qty 3, 28d supply, fill #0

## 2022-10-29 NOTE — Telephone Encounter (Signed)
Refills sent to pharmacy. 

## 2022-10-29 NOTE — Telephone Encounter (Signed)
error 

## 2022-10-29 NOTE — Telephone Encounter (Signed)
Prescription Request  10/29/2022  LOV: 08/05/2022  What is the name of the medication or equipment? Semaglutide-Weight Management (WEGOVY) 1.7 MG/0.75ML SOA    Have you contacted your pharmacy to request a refill?  No   Which pharmacy would you like this sent to?   [facility] [address] [place] [address] Phone: [phone] Fax: [phone]    Patient notified that their request is being sent to the clinical staff for review and that they should receive a response within 2 business days.   Please advise at Mobile [phone] (mobile)

## 2022-10-30 ENCOUNTER — Other Ambulatory Visit (HOSPITAL_COMMUNITY): Payer: Self-pay

## 2022-11-05 ENCOUNTER — Other Ambulatory Visit (HOSPITAL_COMMUNITY): Payer: Self-pay

## 2022-11-05 ENCOUNTER — Encounter: Payer: Managed Care, Other (non HMO) | Admitting: Primary Care

## 2022-11-05 ENCOUNTER — Ambulatory Visit: Payer: Managed Care, Other (non HMO) | Admitting: Primary Care

## 2022-11-12 ENCOUNTER — Encounter: Payer: Managed Care, Other (non HMO) | Admitting: Primary Care

## 2022-11-19 ENCOUNTER — Encounter: Payer: Managed Care, Other (non HMO) | Admitting: Primary Care

## 2022-11-26 ENCOUNTER — Encounter: Payer: Self-pay | Admitting: Primary Care

## 2022-11-26 ENCOUNTER — Other Ambulatory Visit (HOSPITAL_COMMUNITY): Payer: Self-pay

## 2022-11-26 ENCOUNTER — Telehealth: Payer: Self-pay | Admitting: Primary Care

## 2022-11-26 ENCOUNTER — Ambulatory Visit (INDEPENDENT_AMBULATORY_CARE_PROVIDER_SITE_OTHER): Payer: Managed Care, Other (non HMO) | Admitting: Primary Care

## 2022-11-26 VITALS — BP 116/66 | HR 81 | Temp 97.5°F | Ht 63.0 in | Wt 214.0 lb

## 2022-11-26 DIAGNOSIS — E6609 Other obesity due to excess calories: Secondary | ICD-10-CM

## 2022-11-26 DIAGNOSIS — G43009 Migraine without aura, not intractable, without status migrainosus: Secondary | ICD-10-CM

## 2022-11-26 DIAGNOSIS — Z0000 Encounter for general adult medical examination without abnormal findings: Secondary | ICD-10-CM

## 2022-11-26 DIAGNOSIS — E0500 Thyrotoxicosis with diffuse goiter without thyrotoxic crisis or storm: Secondary | ICD-10-CM | POA: Diagnosis not present

## 2022-11-26 DIAGNOSIS — Z6837 Body mass index (BMI) 37.0-37.9, adult: Secondary | ICD-10-CM | POA: Diagnosis not present

## 2022-11-26 DIAGNOSIS — R7303 Prediabetes: Secondary | ICD-10-CM | POA: Diagnosis not present

## 2022-11-26 DIAGNOSIS — Z23 Encounter for immunization: Secondary | ICD-10-CM

## 2022-11-26 DIAGNOSIS — L700 Acne vulgaris: Secondary | ICD-10-CM | POA: Diagnosis not present

## 2022-11-26 MED ORDER — SEMAGLUTIDE-WEIGHT MANAGEMENT 2.4 MG/0.75ML ~~LOC~~ SOAJ: 2.4000 mg | SUBCUTANEOUS | 0 refills | Status: DC

## 2022-11-26 MED ORDER — SEMAGLUTIDE-WEIGHT MANAGEMENT 2.4 MG/0.75ML ~~LOC~~ SOAJ
2.4000 mg | SUBCUTANEOUS | 0 refills | Status: DC
  Filled 2022-11-26 – 2023-01-08 (×2): qty 3, 28d supply, fill #0

## 2022-11-26 NOTE — Telephone Encounter (Signed)
Noted.  Prescription resent to [facility].

## 2022-11-26 NOTE — Patient Instructions (Signed)
Stop by the lab prior to leaving today. I will notify you of your results once received.   We increased the dose of your Wegovy to 2.4 mg weekly.  Work on reducing pastas.  Increase pressures and vegetables.  Increase physical exercise.  Please schedule a follow up visit for 3 months.  It was a pleasure to see you today!

## 2022-11-26 NOTE — Assessment & Plan Note (Signed)
Following with dermatology.  Continue spironolactone 50 mg daily and Retin-A 0.025% cream.

## 2022-11-26 NOTE — Assessment & Plan Note (Signed)
Repeat A1c pending. Commended her on weight loss!  Continue to work on diet.

## 2022-11-26 NOTE — Progress Notes (Signed)
Subjective:    Patient ID: Krista Hogan, female    DOB: Sep 25, 1980, 42 y.o.   MRN: [id number]  HPI  Krista Hogan is a very pleasant 42 y.o. female who presents today for complete physical and follow up of chronic conditions.  Immunizations: -Tetanus: Completed in 2019 -Influenza: Influenza vaccine provided today.   Diet: Fair diet.  Exercise: No regular exercise.  Eye exam: Completes annually  Dental exam: Completes semi-annually    Pap Smear: Completed in 222, follows with GYN Mammogram: Completed in August 2024 per GYN  BP Readings from Last 3 Encounters:  11/26/22 116/66  09/01/22 112/60  08/05/22 128/76   Wt Readings from Last 3 Encounters:  11/26/22 214 lb (97.1 kg)  09/01/22 221 lb 3.2 oz (100.3 kg)  08/05/22 220 lb (99.8 kg)      Review of Systems  Constitutional:  Negative for unexpected weight change.  HENT:  Negative for rhinorrhea.   Respiratory:  Negative for cough and shortness of breath.   Cardiovascular:  Negative for chest pain.  Gastrointestinal:  Negative for constipation and diarrhea.  Genitourinary:  Negative for difficulty urinating.  Musculoskeletal:  Negative for arthralgias and myalgias.  Skin:  Negative for rash.  Allergic/Immunologic: Negative for environmental allergies.  Neurological:  Negative for dizziness, numbness and headaches.  Psychiatric/Behavioral:  The patient is not nervous/anxious.          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   Hyperpigmentation 11/02/2019   Irritant dermatitis 03/21/2020   Mucosal irritation of oral cavity 12/17/2015   Nasal turbinate hypertrophy  08/29/2019   Palpitations 02/25/2019   Postpartum hypertension 01/10/2018   S/P repeat low transverse C-section 01/06/2018   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   Myomectomy by Jamse Arn in 2016  [ ]  c/s 37th week of pregnancy     Uterine fibroid    Wears glasses     Social History   Socioeconomic History   Marital status: Single    Spouse name: Not on file   Number of children: Not on file   Years of education: Not on file   Highest education level: Not on file  Occupational History   Not on file  Tobacco Use   Smoking status: Every Day    Current packs/day: 0.00    Average packs/day: 0.3 packs/day for 10.0 years (2.5 ttl pk-yrs)    Types: Cigarettes    Start date: 04/2007    Last attempt to quit: 04/2017    Years since quitting: 5.6   Smokeless tobacco: Never  Vaping Use   Vaping status: Never Used  Substance and Sexual Activity   Alcohol use: Yes    Alcohol/week: 0.0 standard drinks of alcohol    Comment: SOCIAL   Drug use: No   Sexual activity: Yes    Partners: Male    Birth control/protection: Pill, None  Other Topics Concern   Not on file  Social History Narrative   Not on file   Social Determinants of Health   Financial Resource Strain: Low Risk  (12/22/2017)   Overall Financial Resource Strain (CARDIA)  Difficulty of Paying Living Expenses: Not hard at all  Food Insecurity: No Food Insecurity (12/22/2017)   Hunger Vital Sign    Worried About Running Out of Food in the Last Year: Never true    Ran Out of Food in the Last Year: Never true  Transportation Needs: Unknown (12/22/2017)   PRAPARE - Administrator, Civil Service (Medical): No    Lack of Transportation (Non-Medical): Not on file  Physical Activity: Not on file  Stress: Stress Concern Present (12/22/2017)   [employer] of Occupational Health - Occupational Stress Questionnaire    Feeling of Stress : To some extent  Social Connections: Not on file  Intimate Partner  Violence: Not At Risk (12/22/2017)   Humiliation, Afraid, Rape, and Kick questionnaire    Fear of Current or Ex-Partner: No    Emotionally Abused: No    Physically Abused: No    Sexually Abused: No    Past Surgical History:  Procedure Laterality Date   CESAREAN SECTION N/A 01/06/2018   Procedure: CESAREAN SECTION;  Surgeon: Federico Flake, MD;  Location: [facility];  Service: Obstetrics;  Laterality: N/A;   EXCISION OF SKIN TAG  11/22/2014   Procedure: EXCISION OF SKIN TAG VULVA;  Surgeon: Fermin Schwab, MD;  Location: [facility];  Service: Gynecology;;   LAPAROSCOPIC GELPORT ASSISTED MYOMECTOMY N/A 11/22/2014   Procedure: LAPAROSCOPIC GELPORT ASSISTED MYOMECTOMY WITH CHROMOTUBATION;  Surgeon: Fermin Schwab, MD;  Location: [facility];  Service: Gynecology;  Laterality: N/A;   LAPAROSCOPY N/A 11/22/2014   Procedure: LAPAROSCOPY DIAGNOSTIC;  Surgeon: Fermin Schwab, MD;  Location: [facility];  Service: Gynecology;  Laterality: N/A;   LEEP  2011   TONSILLECTOMY AND ADENOIDECTOMY  age 79   WISDOM TOOTH EXTRACTION  2006    Family History  Problem Relation Age of Onset   Fibroids Mother    Thyroid disease Neg Hx     Allergies  Allergen Reactions   Minocycline Rash    Current Outpatient Medications on File Prior to Visit  Medication Sig Dispense Refill   cetirizine (ZYRTEC) 10 MG tablet Take 1 tablet by mouth daily.     cholecalciferol (VITAMIN D) 1000 units tablet Take 1,000 Units by mouth daily.     ibuprofen (ADVIL) 800 MG tablet TAKE 1 TABLET BY MOUTH EVERY 8 HOURS AS NEEDED 30 tablet 5   Omega-3 Fatty Acids (FISH OIL) 1000 MG CAPS Take 1,000 mg by mouth daily.      spironolactone (ALDACTONE) 50 MG tablet Take 1 tablet by mouth daily.     tretinoin (RETIN-A) 0.025 % cream      Current Facility-Administered Medications on File Prior to Visit  Medication Dose Route Frequency Provider Last Rate Last Admin    triamcinolone acetonide (KENALOG) 10 MG/ML injection 10 mg  10 mg Other Once Stover, Titorya, DPM        BP 116/66   Pulse 81   Temp (!) 97.5 F (36.4 C) (Temporal)   Ht 5\' 3"  (1.6 m)   Wt 214 lb (97.1 kg)   SpO2 99%   BMI 37.91 kg/m  Objective:   Physical Exam HENT:     Right Ear: Tympanic membrane and ear canal normal.     Left Ear: Tympanic membrane and ear canal normal.     Nose: Nose normal.  Eyes:     Conjunctiva/sclera: Conjunctivae normal.     Pupils: Pupils are equal, round, and reactive to light.  Neck:  Thyroid: No thyromegaly.  Cardiovascular:     Rate and Rhythm: Normal rate and regular rhythm.     Heart sounds: No murmur heard. Pulmonary:     Effort: Pulmonary effort is normal.     Breath sounds: Normal breath sounds. No rales.  Abdominal:     General: Bowel sounds are normal.     Palpations: Abdomen is soft.     Tenderness: There is no abdominal tenderness.  Musculoskeletal:        General: Normal range of motion.     Cervical back: Neck supple.  Lymphadenopathy:     Cervical: No cervical adenopathy.  Skin:    General: Skin is warm and dry.     Findings: No rash.  Neurological:     Mental Status: She is alert and oriented to person, place, and time.     Cranial Nerves: No cranial nerve deficit.     Deep Tendon Reflexes: Reflexes are normal and symmetric.  Psychiatric:        Mood and Affect: Mood normal.           Assessment & Plan:  Preventative health care Assessment & Plan: Immunizations UTD. Influenza vaccine provided today.  Pap smear UTD.  Follows with GYN Mammogram UTD, completes at GYN office  Discussed the importance of a healthy diet and regular exercise in order for weight loss, and to reduce the risk of further co-morbidity.  Exam stable. Labs pending.  Follow up in 1 year for repeat physical.    Migraine without aura and without status migrainosus, not intractable Assessment & Plan: Controlled.  Mostly  allergy/environmental.  Continue Excedrin Migraine PRN for which she uses sparingly.   Acne vulgaris Assessment & Plan: Following with dermatology.  Continue spironolactone 50 mg daily and Retin-A 0.025% cream.    Graves disease Assessment & Plan: Following with endocrinology, reviewed office notes and labs from June 2024. Continue to monitor.   Class 2 obesity due to excess calories without serious comorbidity with body mass index (BMI) of 37.0 to 37.9 in adult Assessment & Plan: Improved with weight loss of 7 pounds since June 2024.  Encouraged to continue to work on diet, limit pasta, increase vegetables. Increase Wegovy to 2.4 mg weekly for further benefit.  She agrees.  Increase physical exercise.  Continue to monitor.  Labs pending.  Orders: -     Semaglutide-Weight Management; Inject 2.4 mg into the skin once a week.  Dispense: 9 mL; Refill: 0 -     Lipid panel -     Hemoglobin A1c -     Comprehensive metabolic panel  Prediabetes Assessment & Plan: Repeat A1c pending. Commended her on weight loss!  Continue to work on diet.  Orders: -     Lipid panel -     Hemoglobin A1c -     Comprehensive metabolic panel  Encounter for immunization -     Flu vaccine trivalent PF, 6mos and older(Flulaval,Afluria,Fluarix,Fluzone)        Doreene Nest, NP

## 2022-11-26 NOTE — Assessment & Plan Note (Signed)
Immunizations UTD. Influenza vaccine provided today.  Pap smear UTD.  Follows with GYN Mammogram UTD, completes at GYN office  Discussed the importance of a healthy diet and regular exercise in order for weight loss, and to reduce the risk of further co-morbidity.  Exam stable. Labs pending.  Follow up in 1 year for repeat physical.

## 2022-11-26 NOTE — Assessment & Plan Note (Signed)
Improved with weight loss of 7 pounds since June 2024.  Encouraged to continue to work on diet, limit pasta, increase vegetables. Increase Wegovy to 2.4 mg weekly for further benefit.  She agrees.  Increase physical exercise.  Continue to monitor.  Labs pending.

## 2022-11-26 NOTE — Telephone Encounter (Signed)
As patient was checking out and making f/u appointment, pt wanted to make sure medication wegovy Semaglutide-Weight Management 2.4 MG/0.75ML SOAJ  was sent to [facility]. I show where it was sent to [facility], please advise patient if needed

## 2022-11-26 NOTE — Assessment & Plan Note (Signed)
Controlled.  Mostly allergy/environmental.  Continue Excedrin Migraine PRN for which she uses sparingly.

## 2022-11-26 NOTE — Assessment & Plan Note (Signed)
Following with endocrinology, reviewed office notes and labs from June 2024. Continue to monitor.

## 2022-11-27 LAB — COMPREHENSIVE METABOLIC PANEL
ALT: 14 U/L (ref 0–35)
AST: 17 U/L (ref 0–37)
Albumin: 4.1 g/dL (ref 3.5–5.2)
Alkaline Phosphatase: 74 U/L (ref 39–117)
BUN: 14 mg/dL (ref 6–23)
CO2: 28 mEq/L (ref 19–32)
Calcium: 9.4 mg/dL (ref 8.4–10.5)
Chloride: 104 mEq/L (ref 96–112)
Creatinine, Ser: 0.94 mg/dL (ref 0.40–1.20)
GFR: 75.48 mL/min (ref >60.00)
Glucose, Bld: 72 mg/dL (ref 70–99)
Potassium: 4.5 mEq/L (ref 3.5–5.1)
Sodium: 140 mEq/L (ref 135–145)
Total Bilirubin: 0.4 mg/dL (ref 0.2–1.2)
Total Protein: 7.4 g/dL (ref 6.0–8.3)

## 2022-11-27 LAB — LIPID PANEL
Cholesterol: 138 mg/dL (ref 0–200)
HDL: 47.2 mg/dL (ref >39.00)
LDL Cholesterol: 76 mg/dL (ref 0–99)
NonHDL: 90.88
Total CHOL/HDL Ratio: 3
Triglycerides: 72 mg/dL (ref 0.0–149.0)
VLDL: 14.4 mg/dL (ref 0.0–40.0)

## 2022-11-27 LAB — HEMOGLOBIN A1C: Hgb A1c MFr Bld: 5.3 % (ref 4.6–6.5)

## 2022-12-11 ENCOUNTER — Other Ambulatory Visit (HOSPITAL_COMMUNITY): Payer: Self-pay

## 2022-12-26 ENCOUNTER — Other Ambulatory Visit (HOSPITAL_COMMUNITY): Payer: Self-pay

## 2022-12-27 ENCOUNTER — Other Ambulatory Visit (HOSPITAL_COMMUNITY): Payer: Self-pay

## 2023-01-07 ENCOUNTER — Other Ambulatory Visit (HOSPITAL_COMMUNITY): Payer: Self-pay

## 2023-01-08 ENCOUNTER — Other Ambulatory Visit (HOSPITAL_COMMUNITY): Payer: Self-pay

## 2023-01-08 NOTE — Telephone Encounter (Signed)
Kelli, can you send this to the PA team?

## 2023-01-09 ENCOUNTER — Other Ambulatory Visit (HOSPITAL_COMMUNITY): Payer: Self-pay

## 2023-01-09 ENCOUNTER — Telehealth: Payer: Self-pay

## 2023-01-09 NOTE — Telephone Encounter (Signed)
Pharmacy Patient Advocate Encounter  Received notification from [facility] that Prior Authorization for Wegovy 2.4MG /0.75ML auto-injectors has been APPROVED from 01/09/23 to 07/09/23   PA #/Case ID/Reference #: [id number]

## 2023-01-09 NOTE — Telephone Encounter (Signed)
Pharmacy Patient Advocate Encounter   Received notification from Patient Advice Request messages that prior authorization for Wegovy 2.4MG /0.75ML auto-injectors is required/requested.   Insurance verification completed.   The patient is insured through [facility] .   Per test claim: PA required; PA submitted to above mentioned insurance via CoverMyMeds Key/confirmation #/EOC [id number] Status is pending

## 2023-01-30 ENCOUNTER — Telehealth: Payer: Self-pay

## 2023-01-30 NOTE — Telephone Encounter (Signed)
Spoke with patient and advised to be seen in office for this; patient currently has appt on 02/12/23 at 2:40 pm. Patient will keep appt as scheduled.

## 2023-01-30 NOTE — Telephone Encounter (Signed)
Abdominal Pain: Patient complains of abdominal pain. The pain is described as sharp, and is 6/10 in intensity. Pain is located in the RLQ without radiation. Onset was 1 week ago. Symptoms have been unchanged since. Aggravating factors: activity and movement.  Alleviating factors: laying down or standing still. Associated symptoms: vomiting 1 time on Tuesday  Patient states sharp pain when doing anything that uses muscles. Patient is not sure when last cycle was but thinks it was a couple weeks ago. Last cycle was a lot lighter but she did have more cramps.   Patient wanted to know if she needs to see GYN or you for this. Ok if you just send my chart back to her.

## 2023-01-30 NOTE — Telephone Encounter (Signed)
I strongly advise that she be seen in our office for this. She needs to go to the hospital if she develops fevers, worsening pain, etc.

## 2023-02-12 ENCOUNTER — Ambulatory Visit: Payer: Managed Care, Other (non HMO) | Admitting: Primary Care

## 2023-02-13 ENCOUNTER — Encounter: Payer: Self-pay | Admitting: Primary Care

## 2023-02-24 ENCOUNTER — Telehealth: Payer: Self-pay

## 2023-02-24 ENCOUNTER — Other Ambulatory Visit: Payer: Managed Care, Other (non HMO)

## 2023-02-24 DIAGNOSIS — E0500 Thyrotoxicosis with diffuse goiter without thyrotoxic crisis or storm: Secondary | ICD-10-CM

## 2023-02-24 NOTE — Telephone Encounter (Signed)
Orders Placed This Encounter  Procedures   T4, free   TSH   T3, free

## 2023-03-09 ENCOUNTER — Telehealth: Payer: Self-pay | Admitting: *Deleted

## 2023-03-09 DIAGNOSIS — Z6837 Body mass index (BMI) 37.0-37.9, adult: Secondary | ICD-10-CM

## 2023-03-09 DIAGNOSIS — E66812 Obesity, class 2: Secondary | ICD-10-CM

## 2023-03-09 DIAGNOSIS — R7303 Prediabetes: Secondary | ICD-10-CM

## 2023-03-09 NOTE — Telephone Encounter (Signed)
Why did she stop the [facility]? Was there an issue with the pharmacy? Bad side effects?  We could probably restart the [facility] at a lower dose, 1.7 mg weekly x 4 weeks before resuming her prior dose.

## 2023-03-09 NOTE — Telephone Encounter (Signed)
Copied from CRM [id number]. Topic: Clinical - Medication Question >> Mar 09, 2023  1:16 PM Sim Boast F wrote: Reason for CRM: Patient called wants to if she should restart the [facility] today? Said she's been off of it for 2 weeks.

## 2023-03-09 NOTE — Telephone Encounter (Signed)
Called and spoke with patient she stopped during the holidays because she did not want to feel nauseated. She would like to restart at the lower dose. Would like sent to [facility] pharmacy.

## 2023-03-10 ENCOUNTER — Other Ambulatory Visit (HOSPITAL_COMMUNITY): Payer: Self-pay

## 2023-03-10 MED ORDER — SEMAGLUTIDE-WEIGHT MANAGEMENT 1.7 MG/0.75ML ~~LOC~~ SOAJ
1.7000 mg | SUBCUTANEOUS | 0 refills | Status: DC
  Filled 2023-03-10: qty 3, 28d supply, fill #0

## 2023-03-10 NOTE — Telephone Encounter (Signed)
Noted. Rx for 1.7 mg dose sent to pharmacy.

## 2023-03-10 NOTE — Addendum Note (Signed)
 Addended by: Doreene Nest on: 03/10/2023 02:49 PM   Modules accepted: Orders

## 2023-03-17 ENCOUNTER — Ambulatory Visit: Payer: Managed Care, Other (non HMO) | Admitting: Primary Care

## 2023-03-18 ENCOUNTER — Encounter: Payer: Self-pay | Admitting: Primary Care

## 2023-04-13 ENCOUNTER — Other Ambulatory Visit: Payer: Self-pay | Admitting: Primary Care

## 2023-04-13 DIAGNOSIS — E6609 Other obesity due to excess calories: Secondary | ICD-10-CM

## 2023-04-13 DIAGNOSIS — R7303 Prediabetes: Secondary | ICD-10-CM

## 2023-04-13 MED ORDER — WEGOVY 1.7 MG/0.75ML ~~LOC~~ SOAJ
1.7000 mg | SUBCUTANEOUS | 0 refills | Status: DC
  Filled 2023-04-13: qty 3, 28d supply, fill #0

## 2023-04-14 ENCOUNTER — Other Ambulatory Visit (HOSPITAL_COMMUNITY): Payer: Self-pay

## 2023-04-14 ENCOUNTER — Other Ambulatory Visit: Payer: Self-pay

## 2023-05-26 ENCOUNTER — Other Ambulatory Visit: Payer: Self-pay | Admitting: Primary Care

## 2023-05-26 DIAGNOSIS — R7303 Prediabetes: Secondary | ICD-10-CM

## 2023-05-26 DIAGNOSIS — E6609 Other obesity due to excess calories: Secondary | ICD-10-CM

## 2023-05-26 MED ORDER — WEGOVY 1.7 MG/0.75ML ~~LOC~~ SOAJ
1.7000 mg | SUBCUTANEOUS | 0 refills | Status: DC
  Filled 2023-05-26: qty 3, 28d supply, fill #0

## 2023-05-27 ENCOUNTER — Other Ambulatory Visit: Payer: Self-pay

## 2023-05-27 ENCOUNTER — Other Ambulatory Visit (HOSPITAL_COMMUNITY): Payer: Self-pay

## 2023-07-09 ENCOUNTER — Other Ambulatory Visit (HOSPITAL_COMMUNITY): Payer: Self-pay

## 2023-07-09 ENCOUNTER — Other Ambulatory Visit: Payer: Self-pay | Admitting: Primary Care

## 2023-07-09 DIAGNOSIS — E66812 Obesity, class 2: Secondary | ICD-10-CM

## 2023-07-09 DIAGNOSIS — R7303 Prediabetes: Secondary | ICD-10-CM

## 2023-07-09 MED ORDER — WEGOVY 1.7 MG/0.75ML ~~LOC~~ SOAJ
1.7000 mg | SUBCUTANEOUS | 0 refills | Status: DC
  Filled 2023-07-09: qty 3, 28d supply, fill #0

## 2023-07-27 ENCOUNTER — Ambulatory Visit: Admitting: Podiatry

## 2023-08-12 ENCOUNTER — Ambulatory Visit: Admitting: Podiatry

## 2023-08-12 ENCOUNTER — Encounter: Payer: Self-pay | Admitting: Podiatry

## 2023-08-12 DIAGNOSIS — L600 Ingrowing nail: Secondary | ICD-10-CM | POA: Diagnosis not present

## 2023-08-12 MED ORDER — HYDROCODONE-ACETAMINOPHEN 5-325 MG PO TABS: 1.0000 | ORAL_TABLET | Freq: Four times a day (QID) | ORAL | 0 refills | Status: AC | PRN

## 2023-08-12 NOTE — Patient Instructions (Signed)

## 2023-08-12 NOTE — Progress Notes (Signed)
 subjective:  Patient complains of painful ingrown both borders border(s) toe hallux left. Patient denies fevers, chills, nausea, vomiting.  Objective:  Vitals: Reviewed  General: Well developed, nourished, in no acute distress, alert and oriented x3   Vascular: DP pulse 2/4. PT pulse 2/4.  Mild edema  Dermatology: Erythema, edema, incurvated nail border both with minimal clear drainage . Tenderness present with palpation. Normal skin tone and texture feet with normal hair growth.  Neurological: Grossly intact. Normal reflexes.   Musculoskeletal: Tenderness with palpation of the distal hallux left. No tenderness or painful ROM at IPJ.  Diagnosis: Ingrown nail both borders hallux left  Plan: -discussed etiology and treatment of ingrown nails. Discussed surgical vs conservative treatment. -Consent signed for appropriate matrixectomy affected nail(s). -Rx: Norco 07/11/2023, 1 p.o. every 6 hours as needed pain, dispense 20  Procedure(s):   - Matrixectomy(s) with borders hallux left: Toe anesthetized with 3cc 2:1 mixture 2% Lidocaine  with epinephrine : Sodium Bicarbonate. Surgical site prepped. Digital tourniquet applied.  Avulsion of borders. performed. Matrixecomy performed with three 30 second applications of phenol to nail matrix. Site irrigated with alcohol.  Tourniquet released with good vascularity noticed in digit.  Applied triple antibiotic to nailbed and applied gauze and Coban dressing. - Written and oral postoperative instructions given.  -Return for post-op 2 weeks.  Baker Bon, DPM

## 2023-08-26 ENCOUNTER — Ambulatory Visit: Admitting: Podiatry

## 2023-09-01 ENCOUNTER — Encounter: Payer: Self-pay | Admitting: Internal Medicine

## 2023-09-01 ENCOUNTER — Ambulatory Visit: Payer: Managed Care, Other (non HMO) | Admitting: Internal Medicine

## 2023-09-01 VITALS — BP 124/80 | HR 83 | Ht 63.0 in | Wt 214.4 lb

## 2023-09-01 DIAGNOSIS — E0500 Thyrotoxicosis with diffuse goiter without thyrotoxic crisis or storm: Secondary | ICD-10-CM | POA: Diagnosis not present

## 2023-09-01 LAB — T3, FREE: T3, Free: 3 pg/mL (ref 2.3–4.2)

## 2023-09-01 LAB — TSH: TSH: 1.21 m[IU]/L

## 2023-09-01 LAB — T4, FREE: Free T4: 1.2 ng/dL (ref 0.8–1.8)

## 2023-09-01 NOTE — Progress Notes (Signed)
 Patient ID: Krista Hogan Single, female   DOB: [birth year], 43 y.o.   MRN: [id number]   HPI  Krista Hogan is a 43 y.o.-year-old female, returning for f/u for Graves ds. She previously saw Dr. Kassie, last visit 11/2015. Last visit with me 1 year ago.  Interim hx: She was previously on phentermine , but now on Wegovy  - but inconsistently 2/2 nausea and also as she has been busy with her mother who was diagnosed with breast cancer and she was taking her to radiation therapy.  She she was previously on phentermine . She continues to have blurry vision - possibly due to contacts as she keeps them in too long.  She does see ophthalmology. No palpitations, unintentional weight loss, tremors. Now on Pepcid for acid reflux.  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 She was Rx'ed MMI >> initially refused 2/2 concern for poss. weight gain   After I saw her, we checked labs which confirmed Graves' disease so he started the low-dose methimazole , 2.5 mg daily.  She initially developed headaches, but then she realized that this could have been due to allergies.  Due to the plans for pregnancy, we switched to a low-dose PTU, 50 mg daily.  We did discuss at that time that at the beginning of the second trimester, she will need to switch back to methimazole , but she did not contact me when she entered the second trimester, therefore, she continued PTU until set of labs in 09/2017.    Since the tests were excellent, we stopped PTU at that time.  She did well during the pregnancy but she was lost for follow-up between 10/2017-08/2018.  In 08/2018, labs were normal.    In 02/2019, TSH was slightly low and I advised her to restart methimazole  2.5 mg daily. She did not start as she forgot.  Afterwards, TFTs normalized so we did not have to restart methimazole .  She takes Biotin intermittently (B Complex).  Review her TFTs: Lab Results  Component Value Date   TSH 0.83 09/01/2022   TSH  0.461 02/27/2022   TSH 0.77 08/15/2021   TSH 0.66 08/16/2020   TSH 1.15 08/22/2019   TSH 0.445 (L) 02/24/2019   TSH 0.44 08/23/2018   TSH 1.17 11/02/2017   TSH 1.28 10/07/2017   TSH 0.39 06/25/2017   FREET4 0.80 09/01/2022   FREET4 1.10 02/27/2022   FREET4 0.86 08/15/2021   FREET4 0.87 08/16/2020   FREET4 0.83 08/22/2019   FREET4 1.24 02/24/2019   FREET4 0.83 08/23/2018   FREET4 0.65 11/02/2017   FREET4 0.62 10/07/2017   FREET4 0.72 06/25/2017  08/2016: TSH 0.011 (received after appt)  We diagnosed Graves' disease based on the disease course and elevated TSI antibodies.  These normalized at last check: Lab Results  Component Value Date   TSI 126 11/02/2017   TSI 273 (H) 09/23/2016   Pt denies: - dysphagia (previously had mild dysphagia) - feeling nodules in neck - hoarseness - choking  Pt does have a FH of thyroid  ds. In cousin  No FH of thyroid  cancer. No h/o radiation tx to head or neck.  + Biotin use -but stopped it few days ago. No recent steroids use.   Pt. also has a history of myomectomy. She has a h/o capsulitis in her foot and was in physical therapy. She gave birth to a healthy baby in 12/2017.  ROS: + see HPI  I reviewed pt's medications, allergies, PMH, social hx, family hx, and changes were  documented in the history of present illness. Otherwise, unchanged from my initial visit note.  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   Hyperpigmentation 11/02/2019   Irritant dermatitis 03/21/2020   Mucosal irritation of oral cavity 12/17/2015   Nasal turbinate hypertrophy 08/29/2019   Palpitations 02/25/2019   Postpartum  hypertension 01/10/2018   S/P repeat low transverse C-section 01/06/2018   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   Myomectomy by Levell in 2016  [ ]  c/s 37th week of pregnancy     Uterine fibroid    Wears glasses    Past Surgical History:  Procedure Laterality Date   CESAREAN SECTION N/A 01/06/2018   Procedure: CESAREAN SECTION;  Surgeon: Eldonna Suzen Octave, MD;  Location: [facility];  Service: Obstetrics;  Laterality: N/A;   EXCISION OF SKIN TAG  11/22/2014   Procedure: EXCISION OF SKIN TAG VULVA;  Surgeon: Cynthia Loss, MD;  Location: [facility];  Service: Gynecology;;   LAPAROSCOPIC GELPORT ASSISTED MYOMECTOMY N/A 11/22/2014   Procedure: LAPAROSCOPIC GELPORT ASSISTED MYOMECTOMY WITH CHROMOTUBATION;  Surgeon: Cynthia Loss, MD;  Location: [facility];  Service: Gynecology;  Laterality: N/A;   LAPAROSCOPY N/A 11/22/2014   Procedure: LAPAROSCOPY DIAGNOSTIC;  Surgeon: Cynthia Loss, MD;  Location: [facility];  Service: Gynecology;  Laterality: N/A;   LEEP  2011   TONSILLECTOMY AND ADENOIDECTOMY  age 24   WISDOM TOOTH EXTRACTION  2006   Social History   Social History   Marital status: Single    Spouse name: N/A   Number of children: 0   Occupational History   Lab tech   Social History Main Topics   Smoking status: Current Some Day Smoker    Packs/day: 0.25    Years: 10.00    Types: Cigarettes   Smokeless tobacco: Never Used   Alcohol use 0.0 oz/week     Comment: SOCIAL   Drug use: No   Sexual activity: Yes    Birth control/ protection: None   Current Outpatient Medications on File Prior to Visit  Medication Sig Dispense Refill   cetirizine  (ZYRTEC ) 10 MG tablet Take 1 tablet by mouth daily.     cholecalciferol (VITAMIN D ) 1000 units tablet Take 1,000 Units by mouth daily.     ibuprofen  (ADVIL ) 800 MG tablet TAKE 1 TABLET BY MOUTH EVERY 8 HOURS AS NEEDED 30 tablet 5   Omega-3 Fatty Acids (FISH OIL)  1000 MG CAPS Take 1,000 mg by mouth daily.      Semaglutide -Weight Management (WEGOVY ) 1.7 MG/0.75ML SOAJ Inject 1.7 mg into the skin once a week. (Patient not taking: Reported on 08/12/2023) 3 mL 0   spironolactone (ALDACTONE) 50 MG tablet Take 1 tablet by mouth daily.     tretinoin (RETIN-A) 0.025 % cream      Current Facility-Administered Medications on File Prior to Visit  Medication Dose Route Frequency Provider Last Rate Last Admin   triamcinolone  acetonide (KENALOG ) 10 MG/ML injection 10 mg  10 mg Other Once Stover, Titorya, DPM       Allergies  Allergen Reactions   Minocycline Rash   Family History  Problem Relation Age of Onset   Fibroids Mother    Thyroid  disease Neg Hx    PE: BP 124/80   Pulse 83   Ht 5' 3 (1.6 m)   Wt 214 lb 6.4 oz (97.3 kg)   SpO2 96%   BMI 37.98 kg/m  Wt Readings from Last 3 Encounters:  09/01/23 214 lb 6.4 oz (97.3 kg)  11/26/22 214 lb (97.1 kg)  09/01/22 221 lb 3.2 oz (100.3 kg)   Constitutional: overweight, in NAD Eyes: EOMI, no exophthalmos ENT: + mild symmetric thyromegaly, no cervical lymphadenopathy Cardiovascular: RRR, No MRG Respiratory: CTA B Musculoskeletal: no deformities,  Skin: no rashes Neurological: no tremor with outstretched hands  ASSESSMENT: 1. Graves ds  PLAN:  1. Patient with history of Graves' disease, diagnosed based on the course of the disease and elevated TSI antibodies.  We did not check a thyroid  uptake and scan for diagnosis.  We initially had her on methimazole  and then changed to PTU before her pregnancy.  She did not require medication during pregnancy, however.  She was lost for follow-up in 2019 and returned in 02/2019.  At that time, I sent her message through MyChart to restart the low-dose methimazole , 2.5 mg daily, as her TSH was suppressed.  She did not see the message and returns to the clinic in 08/2019, still off methimazole .  However, TFTs were normal at that time and we ended up continuing her off  methimazole . -At last visit, her TFTs were normal.  We did not have to start back on methimazole .  She continues off the medication now. -At today's visit, she has no tremors, unintentional weight loss, heat intolerance.  She previously had occasional palpitations, which are chronic for her, but she did not notice these recently. She previously had heat intolerance while on phentermine , but then she switched to Wegovy  and this resolved.  She lost 7 pounds since our last visit, intentionally.  Weight appears to be stable in the last 9 months. -She has no evidence of active Graves' ophthalmopathy: No double vision, eye pain, chemosis.  She denies to have some blurry vision, possibly related to contacts or dry eyes.  She is seeing ophthalmology. -At today's visit, we will recheck her TFTs and if she needs to restart methimazole  -I will see her back in a year but possibly sooner for labs.  We did discuss about the possibility of going to see PCP if her tests are normal and she will think about it. Component     Latest Ref Rng 09/01/2023  Triiodothyronine,Free,Serum     2.3 - 4.2 pg/mL 3.0   TSH     mIU/L 1.21   T4,Free(Direct)     0.8 - 1.8 ng/dL 1.2    Normal.  Lela Fendt, MD PhD [facility] Endocrinology

## 2023-09-01 NOTE — Patient Instructions (Signed)
 Please stop at the lab.  Please come back for a follow-up appointment in 1 year.

## 2023-09-02 ENCOUNTER — Ambulatory Visit: Payer: Self-pay | Admitting: Internal Medicine

## 2023-09-17 ENCOUNTER — Other Ambulatory Visit: Payer: Self-pay | Admitting: Primary Care

## 2023-09-17 DIAGNOSIS — E66812 Obesity, class 2: Secondary | ICD-10-CM

## 2023-09-17 DIAGNOSIS — R7303 Prediabetes: Secondary | ICD-10-CM

## 2023-09-18 ENCOUNTER — Other Ambulatory Visit (HOSPITAL_COMMUNITY): Payer: Self-pay

## 2023-09-18 MED ORDER — WEGOVY 1.7 MG/0.75ML ~~LOC~~ SOAJ
1.7000 mg | SUBCUTANEOUS | 0 refills | Status: DC
  Filled 2023-09-18: qty 3, 28d supply, fill #0

## 2023-09-18 NOTE — Telephone Encounter (Signed)
 Patient is overdue for follow up for Wegovy , this will be required prior to any further refills.  Please schedule, thank you!

## 2023-09-18 NOTE — Telephone Encounter (Signed)
 Noted

## 2023-09-18 NOTE — Telephone Encounter (Signed)
 Patient is scheduled

## 2023-09-29 ENCOUNTER — Ambulatory Visit: Admitting: Primary Care

## 2023-09-29 ENCOUNTER — Encounter: Payer: Self-pay | Admitting: Primary Care

## 2023-09-29 VITALS — BP 116/72 | HR 81 | Temp 97.3°F | Ht 63.0 in | Wt 215.0 lb

## 2023-09-29 DIAGNOSIS — R102 Pelvic and perineal pain: Secondary | ICD-10-CM

## 2023-09-29 DIAGNOSIS — G8929 Other chronic pain: Secondary | ICD-10-CM

## 2023-09-29 DIAGNOSIS — E6609 Other obesity due to excess calories: Secondary | ICD-10-CM

## 2023-09-29 DIAGNOSIS — E66812 Obesity, class 2: Secondary | ICD-10-CM

## 2023-09-29 DIAGNOSIS — Z6838 Body mass index (BMI) 38.0-38.9, adult: Secondary | ICD-10-CM

## 2023-09-29 NOTE — Assessment & Plan Note (Signed)
 Stagnant weight loss over the last year.  Discussed this with patient today.  For now, we will continue Wegovy  at 1.7 mg weekly.  Consider dose adjustment to 2.5 mg weekly for continued stagnation of weight despite healthy lifestyle changes.  She will update.

## 2023-09-29 NOTE — Progress Notes (Signed)
 Subjective:    Patient ID: Krista Hogan Single, female    DOB: 08/20/1980, 43 y.o.   MRN: [id number]  HPI  Krista Hogan is a very pleasant 43 y.o. female with a history of migraines, chronic constipation, Graves' disease, obesity, prediabetes, chronic foot pain who presents today for follow-up of obesity and to discuss pelvic pain.   1) Class 2 Obesity: Currently managed on Wegovy  1.7 mg weekly for obesity, prediabetes, chronic foot pain.  He has been managed at the 1.7 mg weekly dose since June 2024.  She has been inconsistent with her Wegovy  1.7 mg due to her mother's diagnosis and treatment for breast cancer. She's been on Wegovy  consistently for the last 2 months and is back on track.   She does feel that Wegovy  has helped with appetite and food cravings. She has been making poor food choices over the last few months due to her mother's heath. She recently began meeting with a nutritionist who is helping to guide her into better food choices.     Wt Readings from Last 3 Encounters:  09/29/23 215 lb (97.5 kg)  09/01/23 214 lb 6.4 oz (97.3 kg)  11/26/22 214 lb (97.1 kg)   2) Pelvic Pain: Chronic history of pelvic pain, known uterine fibroids. About 6 weeks ago she began experiencing sharp, intermittent pain to the right pelvic region. One week ago she had intercourse for the first time in a long time, this caused increased pain and some bleeding thereafter.   Follows with GYN, last pelvic US  was in June 2022 which showed 3 small uterine fibroids.  She has an appointment with her GYN scheduled for next week.  She does experience abdominal bloating and constipation with her Wegovy  injection, which she is managed on MiraLAX  and a mushroom coffee that help keep constipation under control.   Review of Systems  Gastrointestinal:  Negative for abdominal pain, constipation and diarrhea.  Genitourinary:  Positive for pelvic pain. Negative for dysuria, frequency and vaginal discharge.          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   Hyperpigmentation 11/02/2019   Irritant dermatitis 03/21/2020   Mucosal irritation of oral cavity 12/17/2015   Nasal turbinate hypertrophy 08/29/2019   Palpitations 02/25/2019   Postpartum hypertension 01/10/2018   S/P repeat low transverse C-section 01/06/2018   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   Myomectomy by Levell in 2016  [ ]  c/s 37th week of pregnancy     Uterine fibroid    Wears glasses     Social History   Socioeconomic History   Marital status: Single    Spouse name: Not on file   Number of children: Not on file   Years of education: Not on file   Highest education level: Not on file  Occupational History   Not on file  Tobacco Use   Smoking status: Every Day    Current packs/day: 0.00    Average packs/day: 0.3 packs/day for 10.0 years (2.5 ttl pk-yrs)    Types: Cigarettes    Start date: 04/2007    Last attempt to quit: 04/2017  Years since quitting: 6.4   Smokeless tobacco: Never  Vaping Use   Vaping status: Never Used  Substance and Sexual Activity   Alcohol use: Yes    Alcohol/week: 0.0 standard drinks of alcohol    Comment: SOCIAL   Drug use: No   Sexual activity: Yes    Partners: Male    Birth control/protection: Pill, None  Other Topics Concern   Not on file  Social History Narrative   Not on file   Social Drivers of Health   Financial Resource Strain: Low Risk  (12/22/2017)   Overall Financial Resource Strain (CARDIA)    Difficulty of Paying Living Expenses: Not hard at all  Food Insecurity: No Food Insecurity (12/22/2017)   Hunger Vital Sign     Worried About Running Out of Food in the Last Year: Never true    Ran Out of Food in the Last Year: Never true  Transportation Needs: Unknown (12/22/2017)   PRAPARE - Administrator, Civil Service (Medical): No    Lack of Transportation (Non-Medical): Not on file  Physical Activity: Not on file  Stress: Stress Concern Present (12/22/2017)   [employer] of Occupational Health - Occupational Stress Questionnaire    Feeling of Stress : To some extent  Social Connections: Not on file  Intimate Partner Violence: Not At Risk (12/22/2017)   Humiliation, Afraid, Rape, and Kick questionnaire    Fear of Current or Ex-Partner: No    Emotionally Abused: No    Physically Abused: No    Sexually Abused: No    Past Surgical History:  Procedure Laterality Date   CESAREAN SECTION N/A 01/06/2018   Procedure: CESAREAN SECTION;  Surgeon: Eldonna Suzen Octave, MD;  Location: [facility];  Service: Obstetrics;  Laterality: N/A;   EXCISION OF SKIN TAG  11/22/2014   Procedure: EXCISION OF SKIN TAG VULVA;  Surgeon: Cynthia Loss, MD;  Location: [facility];  Service: Gynecology;;   LAPAROSCOPIC GELPORT ASSISTED MYOMECTOMY N/A 11/22/2014   Procedure: LAPAROSCOPIC GELPORT ASSISTED MYOMECTOMY WITH CHROMOTUBATION;  Surgeon: Cynthia Loss, MD;  Location: [facility];  Service: Gynecology;  Laterality: N/A;   LAPAROSCOPY N/A 11/22/2014   Procedure: LAPAROSCOPY DIAGNOSTIC;  Surgeon: Cynthia Loss, MD;  Location: [facility];  Service: Gynecology;  Laterality: N/A;   LEEP  2011   TONSILLECTOMY AND ADENOIDECTOMY  age 56   WISDOM TOOTH EXTRACTION  2006    Family History  Problem Relation Age of Onset   Fibroids Mother    Breast cancer Mother    Thyroid  disease Neg Hx     Allergies  Allergen Reactions   Minocycline Rash    Current Outpatient Medications on File Prior to Visit  Medication Sig Dispense Refill   cetirizine  (ZYRTEC )  10 MG tablet Take 1 tablet by mouth daily.     cholecalciferol (VITAMIN D ) 1000 units tablet Take 1,000 Units by mouth daily.     ibuprofen  (ADVIL ) 800 MG tablet TAKE 1 TABLET BY MOUTH EVERY 8 HOURS AS NEEDED 30 tablet 5   Omega-3 Fatty Acids (FISH OIL) 1000 MG CAPS Take 1,000 mg by mouth daily.      Semaglutide -Weight Management (WEGOVY ) 1.7 MG/0.75ML SOAJ Inject 1.7 mg into the skin once a week. 3 mL 0   spironolactone (ALDACTONE) 50 MG tablet Take 1 tablet by mouth daily.     tretinoin (RETIN-A) 0.025 % cream      Current Facility-Administered Medications on File Prior to Visit  Medication  Dose Route Frequency Provider Last Rate Last Admin   triamcinolone  acetonide (KENALOG ) 10 MG/ML injection 10 mg  10 mg Other Once Burt Fus, DPM        BP 116/72   Pulse 81   Temp (!) 97.3 F (36.3 C) (Temporal)   Ht 5' 3 (1.6 m)   Wt 215 lb (97.5 kg)   LMP 09/19/2023   SpO2 97%   BMI 38.09 kg/m  Objective:   Physical Exam Constitutional:      General: She is not in acute distress. Cardiovascular:     Rate and Rhythm: Normal rate and regular rhythm.  Pulmonary:     Effort: Pulmonary effort is normal.     Breath sounds: Normal breath sounds.  Abdominal:     Tenderness: There is no abdominal tenderness. There is no guarding.   Skin:    General: Skin is warm.           Assessment & Plan:  Acute pelvic pain -     US  PELVIC COMPLETE WITH TRANSVAGINAL; Future  Class 2 obesity due to excess calories without serious comorbidity with body mass index (BMI) of 38.0 to 38.9 in adult Assessment & Plan: Stagnant weight loss over the last year.  Discussed this with patient today.  For now, we will continue Wegovy  at 1.7 mg weekly.  Consider dose adjustment to 2.5 mg weekly for continued stagnation of weight despite healthy lifestyle changes.  She will update.     Chronic pelvic pain in female Assessment & Plan: Reviewed pelvic/transvaginal ultrasound from 2022.  Orders placed  today for pelvic/transvaginal ultrasound. Follow-up with OB/GYN as scheduled.         Natanel Snavely K Tranisha Tissue, NP

## 2023-09-29 NOTE — Assessment & Plan Note (Signed)
 Reviewed pelvic/transvaginal ultrasound from 2022.  Orders placed today for pelvic/transvaginal ultrasound. Follow-up with OB/GYN as scheduled.

## 2023-09-29 NOTE — Patient Instructions (Signed)
 You will receive a phone call regarding the ultrasound.  Please keep me updated regarding your weight.  Please schedule a physical to meet with me in 3 months.   It was a pleasure to see you today!

## 2023-10-01 ENCOUNTER — Ambulatory Visit
Admission: RE | Admit: 2023-10-01 | Discharge: 2023-10-01 | Disposition: A | Discharge status: Home / Self Care | Source: Ambulatory Visit | Attending: Primary Care | Admitting: Primary Care

## 2023-10-01 ENCOUNTER — Ambulatory Visit: Payer: Self-pay | Admitting: Primary Care

## 2023-10-01 DIAGNOSIS — R102 Pelvic and perineal pain: Secondary | ICD-10-CM

## 2023-10-13 ENCOUNTER — Ambulatory Visit: Payer: Self-pay

## 2023-10-13 NOTE — Telephone Encounter (Signed)
 Noted, will evaluate.

## 2023-10-13 NOTE — Telephone Encounter (Signed)
 Noted. Patient has been provided with appropriate recommendations.  It is up to her whether she follows through.  We will see her tomorrow if she presents for her appointment.

## 2023-10-13 NOTE — Telephone Encounter (Signed)
 FYI Only or Action Required?: FYI only for provider.  Patient was last seen in primary care on 09/29/2023 by [name], NP.  Called Nurse Triage reporting Abdominal Pain. Bloating constipation  Symptoms began several months ago.  Interventions attempted: Other: has seen OB. Has had US .  Symptoms are: gradually worsening.  Triage Disposition: See Physician Within 24 Hours  Patient/caregiver understands and will follow disposition?: Yes                      Copied from CRM #[id number]. Topic: Clinical - Red Word Triage >> Oct 13, 2023  1:10 PM [place] MATSU wrote: Red Word that prompted transfer to Nurse Triage: abdominal pain  comes and goes all day Reason for Disposition  [1] MODERATE pain (e.g., interferes with normal activities) AND [2] pain comes and goes (cramps) AND [3] present > 24 hours  (Exception: Pain with Vomiting or Diarrhea - see that Guideline.)  Answer Assessment - Initial Assessment Questions 1. LOCATION: Where does it hurt?      Lower  and middle right side - Pain with sex 2. RADIATION: Does the pain shoot anywhere else? (e.g., chest, back)     no 3. ONSET: When did the pain begin? (e.g., minutes, hours or days ago)      A couple months 4. SUDDEN: Gradual or sudden onset?     gradual 5. PATTERN Does the pain come and go, or is it constant?     Comes and goes 6. SEVERITY: How bad is the pain?  (e.g., Scale 1-10; mild, moderate, or severe)     severe 7. RECURRENT SYMPTOM: Have you ever had this type of stomach pain before? If Yes, ask: When was the last time? and What happened that time?      never 8. CAUSE: What do you think is causing the stomach pain? (e.g., gallstones, recent abdominal surgery)     wegovy  9. RELIEVING/AGGRAVATING FACTORS: What makes it better or worse? (e.g., antacids, bending or twisting motion, bowel movement)     No - when she lays on stomach it hurts 10. OTHER SYMPTOMS: Do you have any other  symptoms? (e.g., back pain, diarrhea, fever, urination pain, vomiting)       Gas and constipated.  Protocols used: Abdominal Pain - Female-A-AH

## 2023-10-13 NOTE — Telephone Encounter (Signed)
 FYI Only or Action Required?: FYI only for provider.  Patient was last seen in primary care on 09/29/2023 by [name], NP.  Called Nurse Triage reporting Abdominal Pain.  Symptoms began about a month ago.  Interventions attempted: Rest, hydration, or home remedies.  Symptoms are: rapidly worsening.  Triage Disposition: Go to ED Now (Notify PCP)  Patient/caregiver understands and will follow disposition?: Unsure   **Patient states her abdominal pain is a 10/10, referred to ED, unsure if she will go**           Copied from CRM #[id number]. Topic: Clinical - Red Word Triage >> Oct 13, 2023  1:26 PM Gustabo D wrote: Krista Hogan abdominal pain for a couple months that has gotten worse. Reason for Disposition  [1] SEVERE pain (e.g., excruciating) AND [2] present > 1 hour  Answer Assessment - Initial Assessment Questions 1. LOCATION: Where does it hurt?      Lower right side  2. RADIATION: Does the pain shoot anywhere else? (e.g., chest, back)     No   3. ONSET: When did the pain begin? (e.g., minutes, hours or days ago)      X 1 month  4. SUDDEN: Gradual or sudden onset?       5. PATTERN Does the pain come and go, or is it constant?      Intermittent  6. SEVERITY: How bad is the pain?  (e.g., Scale 1-10; mild, moderate, or severe)     10/10  7. RECURRENT SYMPTOM: Have you ever had this type of stomach pain before? If Yes, ask: When was the last time? and What happened that time?      No   8. CAUSE: What do you think is causing the stomach pain? (e.g., gallstones, recent abdominal surgery)     Unknown   9. RELIEVING/AGGRAVATING FACTORS: What makes it better or worse? (e.g., antacids, bending or twisting motion, bowel movement)     Nothing  10. OTHER SYMPTOMS: Do you have any other symptoms? (e.g., back pain, diarrhea, fever, urination pain, vomiting)   Constipation, last BM was x 2 days ago  Was seen by GYN, no significant findings.  She reports symptoms are worsening, nothing helps relieve the pain. Referred to the ED for pain rating of 10/10. She states she cannot go today as she is taking her daughter somewhere. She is requesting an appointment, however patient was advised I cannot schedule an appointment with a 10/10 pain rating. She ended the call.  Protocols used: Abdominal Pain - Female-A-AH

## 2023-10-14 ENCOUNTER — Ambulatory Visit: Admitting: Primary Care

## 2023-10-14 ENCOUNTER — Encounter: Payer: Self-pay | Admitting: Primary Care

## 2023-10-14 ENCOUNTER — Encounter: Payer: Self-pay | Admitting: *Deleted

## 2023-10-14 VITALS — BP 124/88 | HR 83 | Temp 97.5°F | Ht 63.0 in | Wt 214.0 lb

## 2023-10-14 DIAGNOSIS — R1031 Right lower quadrant pain: Secondary | ICD-10-CM

## 2023-10-14 DIAGNOSIS — G8929 Other chronic pain: Secondary | ICD-10-CM

## 2023-10-14 DIAGNOSIS — R109 Unspecified abdominal pain: Secondary | ICD-10-CM | POA: Insufficient documentation

## 2023-10-14 DIAGNOSIS — R102 Pelvic and perineal pain: Secondary | ICD-10-CM

## 2023-10-14 LAB — POC URINALSYSI DIPSTICK (AUTOMATED)
Bilirubin, UA: NEGATIVE
Blood, UA: POSITIVE
Glucose, UA: NEGATIVE
Ketones, UA: NEGATIVE
Leukocytes, UA: NEGATIVE
Nitrite, UA: NEGATIVE
Protein, UA: NEGATIVE
Spec Grav, UA: 1.02 (ref 1.010–1.025)
Urobilinogen, UA: 0.2 U/dL
pH, UA: 5.5 (ref 5.0–8.0)

## 2023-10-14 NOTE — Assessment & Plan Note (Signed)
 Reviewed transvaginal/pelvic ultrasound from July 2025.  Will obtain CT abdomen pelvis given ongoing symptoms.

## 2023-10-14 NOTE — Progress Notes (Signed)
 Subjective:    Patient ID: Krista Hogan Single, female    DOB: [birth year], 43 y.o.   MRN: [id number]  Pelvic Pain The patient's primary symptoms include pelvic pain. Associated symptoms include abdominal pain, constipation and frequency. Pertinent negatives include no diarrhea, dysuria, fever, flank pain, nausea or vomiting.    Krista Hogan is a very pleasant 43 y.o. female with a history of migraines, Graves' disease, chronic idiopathic constipation, GERD, prediabetes who presents today to discuss abdominal pain.  Her mother joins us  today.  Her pain is located to the right pelvic region/right lower abdomen which began about 3 months ago. Her pain is constant for which she describes as sharp. Over the last few weeks she's noticed a progression of pain.   She is currently managed on Wegovy  at 1.7 mg weekly.  She began Wegovy  consistently in March 2024. About three months ago she began having bowel movements every 2-3 days which have been painful.  Her last bowel movement was this morning which was large and painful.  She has been taking laxatives intermittently with temporary improvement.  She was evaluated in our office on 09/29/2023 for the same symptoms, a pelvic/transvaginal ultrasound was ordered which revealed 2 cm right ovarian follicular cyst.  She was evaluated by her OB/GYN shortly after and was told that the cyst is not causing pain.  She does have pain during intercourse.   She denies nausea, vomiting, bloody stools, new medications, dysuria, fevers, flank pain.  She has noticed increased urinary frequency which is abnormal for her.  Body mass index is 37.91 kg/m. Wt Readings from Last 3 Encounters:  10/14/23 214 lb (97.1 kg)  09/29/23 215 lb (97.5 kg)  09/01/23 214 lb 6.4 oz (97.3 kg)     Review of Systems  Constitutional:  Negative for fever.  Gastrointestinal:  Positive for abdominal pain and constipation. Negative for diarrhea, nausea and vomiting.  Genitourinary:   Positive for frequency and pelvic pain. Negative for dysuria and flank pain.         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   Hyperpigmentation 11/02/2019   Irritant dermatitis 03/21/2020   Mucosal irritation of oral cavity 12/17/2015   Nasal turbinate hypertrophy 08/29/2019   Palpitations 02/25/2019   Postpartum hypertension 01/10/2018   S/P repeat low transverse C-section 01/06/2018   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   Myomectomy by Levell in 2016  [ ]  c/s 37th week of pregnancy     Uterine fibroid    Wears glasses     Social History   Socioeconomic History   Marital status: Single    Spouse name: Not on file   Number of children: Not on file   Years of education: Not on file   Highest education level: Not on file  Occupational History   Not on file  Tobacco Use   Smoking status: Every Day    Current packs/day: 0.00    Average packs/day: 0.3 packs/day for 10.0 years (2.5 ttl pk-yrs)    Types: Cigarettes    Start date: 04/2007    Last attempt to quit: 04/2017  Years since quitting: 6.5   Smokeless tobacco: Never  Vaping Use   Vaping status: Never Used  Substance and Sexual Activity   Alcohol use: Yes    Alcohol/week: 0.0 standard drinks of alcohol    Comment: SOCIAL   Drug use: No   Sexual activity: Yes    Partners: Male    Birth control/protection: Pill, None  Other Topics Concern   Not on file  Social History Narrative   Not on file   Social Drivers of Health   Financial Resource Strain: Low Risk  (12/22/2017)   Overall Financial Resource Strain (CARDIA)    Difficulty of Paying Living Expenses: Not hard at  all  Food Insecurity: No Food Insecurity (12/22/2017)   Hunger Vital Sign    Worried About Running Out of Food in the Last Year: Never true    Ran Out of Food in the Last Year: Never true  Transportation Needs: Unknown (12/22/2017)   PRAPARE - Administrator, Civil Service (Medical): No    Lack of Transportation (Non-Medical): Not on file  Physical Activity: Not on file  Stress: Stress Concern Present (12/22/2017)   [employer] of Occupational Health - Occupational Stress Questionnaire    Feeling of Stress : To some extent  Social Connections: Not on file  Intimate Partner Violence: Not At Risk (12/22/2017)   Humiliation, Afraid, Rape, and Kick questionnaire    Fear of Current or Ex-Partner: No    Emotionally Abused: No    Physically Abused: No    Sexually Abused: No    Past Surgical History:  Procedure Laterality Date   CESAREAN SECTION N/A 01/06/2018   Procedure: CESAREAN SECTION;  Surgeon: Eldonna Suzen Octave, MD;  Location: [facility];  Service: Obstetrics;  Laterality: N/A;   EXCISION OF SKIN TAG  11/22/2014   Procedure: EXCISION OF SKIN TAG VULVA;  Surgeon: Cynthia Loss, MD;  Location: [facility];  Service: Gynecology;;   LAPAROSCOPIC GELPORT ASSISTED MYOMECTOMY N/A 11/22/2014   Procedure: LAPAROSCOPIC GELPORT ASSISTED MYOMECTOMY WITH CHROMOTUBATION;  Surgeon: Cynthia Loss, MD;  Location: [facility];  Service: Gynecology;  Laterality: N/A;   LAPAROSCOPY N/A 11/22/2014   Procedure: LAPAROSCOPY DIAGNOSTIC;  Surgeon: Cynthia Loss, MD;  Location: [facility];  Service: Gynecology;  Laterality: N/A;   LEEP  2011   TONSILLECTOMY AND ADENOIDECTOMY  age 52   WISDOM TOOTH EXTRACTION  2006    Family History  Problem Relation Age of Onset   Fibroids Mother    Breast cancer Mother    Thyroid  disease Neg Hx     Allergies  Allergen Reactions   Minocycline Rash    Current Outpatient Medications  on File Prior to Visit  Medication Sig Dispense Refill   cetirizine  (ZYRTEC ) 10 MG tablet Take 1 tablet by mouth daily.     cholecalciferol (VITAMIN D ) 1000 units tablet Take 1,000 Units by mouth daily.     ibuprofen  (ADVIL ) 800 MG tablet TAKE 1 TABLET BY MOUTH EVERY 8 HOURS AS NEEDED 30 tablet 5   Omega-3 Fatty Acids (FISH OIL) 1000 MG CAPS Take 1,000 mg by mouth daily.      Semaglutide -Weight Management (WEGOVY ) 1.7 MG/0.75ML SOAJ Inject 1.7 mg into the skin once a week. 3 mL 0   spironolactone (ALDACTONE) 50 MG tablet Take 1 tablet by mouth daily.     tretinoin (RETIN-A) 0.025 % cream      Current Facility-Administered Medications on File Prior to Visit  Medication  Dose Route Frequency Provider Last Rate Last Admin   triamcinolone  acetonide (KENALOG ) 10 MG/ML injection 10 mg  10 mg Other Once Burt Fus, DPM        BP 124/88   Pulse 83   Temp (!) 97.5 F (36.4 C) (Temporal)   Ht 5' 3 (1.6 m)   Wt 214 lb (97.1 kg)   LMP 09/19/2023   SpO2 98%   BMI 37.91 kg/m  Objective:   Physical Exam Cardiovascular:     Rate and Rhythm: Normal rate and regular rhythm.  Pulmonary:     Effort: Pulmonary effort is normal.     Breath sounds: Normal breath sounds.  Abdominal:     General: Bowel sounds are normal.     Palpations: Abdomen is soft.     Tenderness: There is abdominal tenderness in the right lower quadrant and suprapubic area. There is no guarding.   Musculoskeletal:     Cervical back: Neck supple.  Skin:    General: Skin is warm and dry.  Neurological:     Mental Status: She is alert and oriented to person, place, and time.  Psychiatric:        Mood and Affect: Mood normal.           Assessment & Plan:  Right lower quadrant abdominal pain Assessment & Plan: Differentials include constipation, pelvic etiology, appendicitis.  Worse and persistent since last visit. Reviewed transvaginal/pelvic ultrasound from July 2025.  Given lack of clear cause for  symptoms, coupled with increased symptoms, will obtain CT abdomen pelvis with contrast. Labs pending today including BMP, CBC with differential.  Consider discontinuation of Wegovy .    Orders: -     CBC with Differential/Platelet -     POCT Urinalysis Dipstick (Automated) -     Basic metabolic panel with GFR  Chronic pelvic pain in female Assessment & Plan: Reviewed transvaginal/pelvic ultrasound from July 2025.  Will obtain CT abdomen pelvis given ongoing symptoms.  Orders: -     CBC with Differential/Platelet -     POCT Urinalysis Dipstick (Automated) -     Basic metabolic panel with GFR        Comer MARLA Gaskins, NP

## 2023-10-14 NOTE — Assessment & Plan Note (Addendum)
 Differentials include constipation, pelvic etiology, appendicitis.  Worse and persistent since last visit. Reviewed transvaginal/pelvic ultrasound from July 2025.  UA today with trace blood, otherwise negative.   Given lack of clear cause for symptoms, coupled with increased symptoms, will obtain CT abdomen pelvis with contrast. Labs pending today including BMP, CBC with differential.  Consider discontinuation of Wegovy .

## 2023-10-14 NOTE — Patient Instructions (Signed)
 Stop by the lab prior to leaving today. I will notify you of your results once received.   We will be in touch once you receive your CT scan results.  Docusate sodium  (Colace) 100 mg can be taken daily to help soften stools. MiraLAX  is also another option.

## 2023-10-15 ENCOUNTER — Ambulatory Visit: Payer: Self-pay | Admitting: Primary Care

## 2023-10-15 ENCOUNTER — Inpatient Hospital Stay
Admission: RE | Admit: 2023-10-15 | Discharge: 2023-10-15 | Discharge status: Home / Self Care | Source: Ambulatory Visit | Attending: Primary Care

## 2023-10-15 DIAGNOSIS — R1031 Right lower quadrant pain: Secondary | ICD-10-CM

## 2023-10-15 DIAGNOSIS — R1010 Upper abdominal pain, unspecified: Secondary | ICD-10-CM

## 2023-10-15 LAB — BASIC METABOLIC PANEL WITH GFR
BUN: 12 mg/dL (ref 6–23)
CO2: 20 meq/L (ref 19–32)
Calcium: 9.7 mg/dL (ref 8.4–10.5)
Chloride: 102 meq/L (ref 96–112)
Creatinine, Ser: 0.71 mg/dL (ref 0.40–1.20)
GFR: 105.05 mL/min (ref >60.00)
Glucose, Bld: 70 mg/dL (ref 70–99)
Potassium: 4.4 meq/L (ref 3.5–5.1)
Sodium: 137 meq/L (ref 135–145)

## 2023-10-15 LAB — CBC WITH DIFFERENTIAL/PLATELET
Basophils Absolute: 0.1 K/uL (ref 0.0–0.1)
Basophils Relative: 1.1 % (ref 0.0–3.0)
Eosinophils Absolute: 0.1 K/uL (ref 0.0–0.7)
Eosinophils Relative: 1.3 % (ref 0.0–5.0)
HCT: 42.5 % (ref 36.0–46.0)
Hemoglobin: 14.3 g/dL (ref 12.0–15.0)
Lymphocytes Relative: 40.2 % (ref 12.0–46.0)
Lymphs Abs: 2.9 K/uL (ref 0.7–4.0)
MCHC: 33.6 g/dL (ref 30.0–36.0)
MCV: 96.1 fl (ref 78.0–100.0)
Monocytes Absolute: 0.7 K/uL (ref 0.1–1.0)
Monocytes Relative: 9.4 % (ref 3.0–12.0)
Neutro Abs: 3.4 K/uL (ref 1.4–7.7)
Neutrophils Relative %: 48 % (ref 43.0–77.0)
Platelets: 403 K/uL — ABNORMAL HIGH (ref 150.0–400.0)
RBC: 4.43 Mil/uL (ref 3.87–5.11)
RDW: 13.3 % (ref 11.5–15.5)
WBC: 7.1 K/uL (ref 4.0–10.5)

## 2023-10-15 MED ORDER — IOPAMIDOL (ISOVUE-300) INJECTION 61%
100.0000 mL | Freq: Once | INTRAVENOUS | Status: AC | PRN
  Administered 2023-10-15: 100 mL via INTRAVENOUS

## 2023-11-03 ENCOUNTER — Other Ambulatory Visit (INDEPENDENT_AMBULATORY_CARE_PROVIDER_SITE_OTHER)

## 2023-11-03 DIAGNOSIS — R1010 Upper abdominal pain, unspecified: Secondary | ICD-10-CM | POA: Diagnosis not present

## 2023-11-04 ENCOUNTER — Ambulatory Visit: Payer: Self-pay | Admitting: Primary Care

## 2023-11-04 DIAGNOSIS — R7303 Prediabetes: Secondary | ICD-10-CM

## 2023-11-04 DIAGNOSIS — R1031 Right lower quadrant pain: Secondary | ICD-10-CM

## 2023-11-04 DIAGNOSIS — E6609 Other obesity due to excess calories: Secondary | ICD-10-CM

## 2023-11-04 LAB — LIPASE: Lipase: 27 U/L (ref 11.0–59.0)

## 2023-11-06 LAB — ALPHA-GAL PANEL
Allergen, Mutton, f88: <0.1 kU/L
Allergen, Pork, f26: <0.1 kU/L
Beef: <0.1 kU/L
CLASS: 0
CLASS: 0
Class: 0
GALACTOSE-ALPHA-1,3-GALACTOSE IGE*: <0.1 kU/L (ref <0.10)

## 2023-11-06 LAB — H. PYLORI BREATH TEST: H. pylori Breath Test: NOT DETECTED

## 2023-11-06 LAB — INTERPRETATION:

## 2023-11-16 ENCOUNTER — Encounter: Payer: Self-pay | Admitting: Gastroenterology

## 2023-11-16 ENCOUNTER — Telehealth: Payer: Self-pay | Admitting: Physician Assistant

## 2023-11-16 NOTE — Telephone Encounter (Signed)
 Left VM  for patient to call and be scheduled per referral.

## 2023-11-20 ENCOUNTER — Other Ambulatory Visit (HOSPITAL_COMMUNITY): Payer: Self-pay

## 2023-11-20 MED ORDER — SEMAGLUTIDE-WEIGHT MANAGEMENT 0.25 MG/0.5ML ~~LOC~~ SOAJ
0.2500 mg | SUBCUTANEOUS | 0 refills | Status: DC
  Filled 2023-11-20: qty 2, 28d supply, fill #0

## 2023-12-29 DIAGNOSIS — E6609 Other obesity due to excess calories: Secondary | ICD-10-CM

## 2023-12-29 DIAGNOSIS — R7303 Prediabetes: Secondary | ICD-10-CM

## 2023-12-29 MED ORDER — WEGOVY 0.5 MG/0.5ML ~~LOC~~ SOAJ: 0.5000 mg | SUBCUTANEOUS | 0 refills | Status: DC

## 2023-12-30 ENCOUNTER — Encounter: Admitting: Primary Care

## 2024-01-05 ENCOUNTER — Other Ambulatory Visit (HOSPITAL_COMMUNITY): Payer: Self-pay

## 2024-01-06 ENCOUNTER — Other Ambulatory Visit: Payer: Self-pay | Admitting: Primary Care

## 2024-01-06 ENCOUNTER — Other Ambulatory Visit (HOSPITAL_COMMUNITY): Payer: Self-pay

## 2024-01-06 ENCOUNTER — Other Ambulatory Visit: Payer: Self-pay

## 2024-01-06 ENCOUNTER — Ambulatory Visit: Admitting: Gastroenterology

## 2024-01-06 DIAGNOSIS — E66812 Obesity, class 2: Secondary | ICD-10-CM

## 2024-01-06 DIAGNOSIS — R7303 Prediabetes: Secondary | ICD-10-CM

## 2024-01-06 MED ORDER — WEGOVY 0.5 MG/0.5ML ~~LOC~~ SOAJ
0.5000 mg | SUBCUTANEOUS | 0 refills | Status: DC
  Filled 2024-01-06: qty 2, 28d supply, fill #0

## 2024-01-06 MED ORDER — SEMAGLUTIDE-WEIGHT MANAGEMENT 0.5 MG/0.5ML ~~LOC~~ SOAJ
0.5000 mg | SUBCUTANEOUS | 0 refills | Status: DC
  Filled 2024-01-06: qty 2, 28d supply, fill #0

## 2024-01-14 DIAGNOSIS — R3129 Other microscopic hematuria: Secondary | ICD-10-CM

## 2024-01-18 ENCOUNTER — Telehealth: Payer: Self-pay | Admitting: Primary Care

## 2024-01-18 NOTE — Telephone Encounter (Signed)
 Copied from CRM [id number]. Topic: Appointments - Scheduling Inquiry for Clinic >> Jan 18, 2024  1:14 PM Thersia C wrote: Reason for CRM: Patient called in would like to know if its okay to keep appointment for tomorrow as she hasn't been using wegovy  like she should, wanted to know if she needs to use it constantly first before appointment or should she just come in would like a callback regarding this

## 2024-01-18 NOTE — Telephone Encounter (Signed)
 Spoke with pt and advised her that I would keep the appointment that she has with Mallie tomorrow as it is her CPE. Pt verbalized understanding.

## 2024-01-19 ENCOUNTER — Ambulatory Visit: Payer: Self-pay

## 2024-01-19 ENCOUNTER — Other Ambulatory Visit

## 2024-01-19 DIAGNOSIS — R3129 Other microscopic hematuria: Secondary | ICD-10-CM

## 2024-01-19 NOTE — Telephone Encounter (Signed)
 Noted

## 2024-01-19 NOTE — Telephone Encounter (Signed)
 FYI Only or Action Required?: FYI only for provider: home care advised. Lab appointment made to give urine sample. Patient requested physical be rescheduled which was done.  Patient was last seen in primary care on 10/14/2023 by [name], NP.  Called Nurse Triage reporting Back Pain.  Symptoms began 2 weeks ago.  Interventions attempted: Rest, hydration, or home remedies.  Symptoms are: unchanged.  Triage Disposition: Home Care  Patient/caregiver understands and will follow disposition?: Yes  Copied from CRM #[id number]. Topic: Clinical - Red Word Triage >> Jan 19, 2024 11:49 AM Rosina BIRCH wrote: Reason for RMF:ajrx pain in the past two weeks Reason for Disposition  Back pain  Answer Assessment - Initial Assessment Questions Patient called to make a lab appointment for today to give a urine sample as well as rescheduling her appointment for her physical. Both appointments scheduled. Patient reports two weeks of mid to low back pain. Patient states she hasn't tried any OTC medications. Patient given care advise in regards to home care. Patient instructed to call back if back pain is still present after trying home care advise. All questions answered.   1. ONSET: When did the pain begin? (e.g., minutes, hours, days)     Two weeks ago 2. LOCATION: Where does it hurt? (upper, mid or lower back)     Mid to low-wraps around her side 3. SEVERITY: How bad is the pain?  (e.g., Scale 1-10; mild, moderate, or severe)     5 out of 10 4. PATTERN: Is the pain constant? (e.g., yes, no; constant, intermittent)      constant 5. RADIATION: Does the pain shoot into your legs or somewhere else?     no 6. CAUSE:  What do you think is causing the back pain?      unsure 7. BACK OVERUSE:  Any recent lifting of heavy objects, strenuous work or exercise?     Recent lifting 8. MEDICINES: What have you taken so far for the pain? (e.g., nothing, acetaminophen , NSAIDS)     No  medications 9. NEUROLOGIC SYMPTOMS: Do you have any weakness, numbness, or problems with bowel/bladder control?     no 10. OTHER SYMPTOMS: Do you have any other symptoms? (e.g., fever, abdomen pain, burning with urination, blood in urine)       no 11. PREGNANCY: Is there any chance you are pregnant? When was your last menstrual period?       no  Protocols used: Back Pain-A-AH

## 2024-01-20 ENCOUNTER — Ambulatory Visit: Payer: Self-pay | Admitting: Primary Care

## 2024-01-20 ENCOUNTER — Encounter: Admitting: Primary Care

## 2024-01-20 LAB — URINALYSIS, ROUTINE W REFLEX MICROSCOPIC
Bilirubin, UA: NEGATIVE
Glucose, UA: NEGATIVE
Ketones, UA: NEGATIVE
Leukocytes,UA: NEGATIVE
Nitrite, UA: NEGATIVE
Protein,UA: NEGATIVE
RBC, UA: NEGATIVE
Specific Gravity, UA: 1.019 (ref 1.005–1.030)
Urobilinogen, Ur: 0.2 mg/dL (ref 0.2–1.0)
pH, UA: 7.5 (ref 5.0–7.5)

## 2024-02-16 ENCOUNTER — Encounter: Admitting: Primary Care

## 2024-02-24 ENCOUNTER — Encounter: Payer: Self-pay | Admitting: Gastroenterology

## 2024-02-24 ENCOUNTER — Ambulatory Visit (INDEPENDENT_AMBULATORY_CARE_PROVIDER_SITE_OTHER)
Admission: RE | Admit: 2024-02-24 | Discharge: 2024-02-24 | Disposition: A | Discharge status: Home / Self Care | Source: Ambulatory Visit | Attending: Gastroenterology | Admitting: Gastroenterology

## 2024-02-24 ENCOUNTER — Ambulatory Visit: Admitting: Gastroenterology

## 2024-02-24 VITALS — BP 112/68 | HR 83 | Ht 63.0 in | Wt 223.0 lb

## 2024-02-24 DIAGNOSIS — K5904 Chronic idiopathic constipation: Secondary | ICD-10-CM

## 2024-02-24 DIAGNOSIS — R1031 Right lower quadrant pain: Secondary | ICD-10-CM

## 2024-02-24 DIAGNOSIS — K6289 Other specified diseases of anus and rectum: Secondary | ICD-10-CM

## 2024-02-24 DIAGNOSIS — K644 Residual hemorrhoidal skin tags: Secondary | ICD-10-CM | POA: Diagnosis not present

## 2024-02-24 DIAGNOSIS — K648 Other hemorrhoids: Secondary | ICD-10-CM

## 2024-02-24 MED ORDER — HYDROCORTISONE ACETATE 25 MG RE SUPP: 25.0000 mg | Freq: Every evening | RECTAL | 1 refills | Status: AC

## 2024-02-24 NOTE — Patient Instructions (Addendum)
 Constipation Recommend high fiber diet Drink plenty of fluids Will call with results of xray, if positive for constipation will start samples Linzess 72mcg 1 capsule 30-45 mins before first meal of day  Hemorrhoids Recommend high fiber diet No straining or pushing 1 Suppository at bedtime for 7-10 days Samples provided and prescription sent  Your provider has requested that you have an abdominal x ray before leaving today. Please go to the basement floor to our Radiology department for the test.  _______________________________________________________  If your blood pressure at your visit was 140/90 or greater, please contact your primary care physician to follow up on this.  _______________________________________________________  If you are age 42 or older, your body mass index should be between 23-30. Your Body mass index is 39.5 kg/m. If this is out of the aforementioned range listed, please consider follow up with your Primary Care Provider.  If you are age 46 or younger, your body mass index should be between 19-25. Your Body mass index is 39.5 kg/m. If this is out of the aformentioned range listed, please consider follow up with your Primary Care Provider.   ________________________________________________________  The [facility] providers would like to encourage you to use MYCHART to communicate with providers for non-urgent requests or questions.  Due to long hold times on the telephone, sending your provider a message by [facility] may be a faster and more efficient way to get a response.  Please allow 48 business hours for a response.  Please remember that this is for non-urgent requests.  _______________________________________________________  [facility] is using a team-based approach to care.  Your team is made up of your doctor and two to three APPS. Our APPS (Nurse Practitioners and Physician Assistants) work with your physician to ensure care continuity for  you. They are fully qualified to address your health concerns and develop a treatment plan. They communicate directly with your gastroenterologist to care for you. Seeing the Advanced Practice Practitioners on your physician's team can help you by facilitating care more promptly, often allowing for earlier appointments, access to diagnostic testing, procedures, and other specialty referrals.   Thank you for trusting me with your gastrointestinal care. Deanna May, FNP-C

## 2024-02-24 NOTE — Progress Notes (Signed)
 ANOSCOPY PROCEDURE REPORT:     INDICATION: skin tag, hemorrhoids, rectal pain     The nature of anoscopy procedure and rationale for use was explained to the patient and they understood and agreed to proceed.     The lubricated lighted anoscope was inserted with the patient in the left lateral decubitus position and the anus and distal rectum was examined.     FINDINGS: external skin tag. Internal hemorrhoids

## 2024-02-24 NOTE — Progress Notes (Signed)
 Chief Complaint:RLQ pain Primary GI Doctor: Dr. San  HPI:  Patient is a  43  year old female patient with past medical history of migraines, Graves' disease, chronic idiopathic constipation, GERD, prediabetes, who was referred to me by [name], NP on 11/12/23 for a evaluation of RLQ pain .    Interval History  Patient presents for evaluation of right lower quadrant abdominal pain.  Patient reports the pain has been going on for a few months now. She reports she has the pain if she lays on her right side. She has tried OTC Probiotics, Spirulina and Chlorella,Gut health, and Body Magic herbal supplements without help with pain.  Denies any recent injury or lifting heavy.  Patient also reports rectal pain she thinks is from a anal fissure. She did sitz baths and applied ointment on area and reports it has improved.   She also feels a flap of skin around her rectum she wants removed because it is hard to keep clean.   She has history of constipation and reports the OTC gut health helps some. She has BM every 1-2 days. She notes if she drinks coffee she has BM. She has used OTC stool softners and Miralax  which works, but takes a while.   She reports being on Wegovy  for about a year now.   Nonsmoker. One drink on average per day.  Never had EGD/colon.  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   Hyperpigmentation 11/02/2019   Irritant  dermatitis 03/21/2020   Mucosal irritation of oral cavity 12/17/2015   Nasal turbinate hypertrophy 08/29/2019   Palpitations 02/25/2019   Postpartum hypertension 01/10/2018   S/P repeat low transverse C-section 01/06/2018   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   Myomectomy by Levell in 2016  [ ]  c/s 37th week of pregnancy     Uterine fibroid    Wears glasses     Past Surgical History:  Procedure Laterality Date   CESAREAN SECTION N/A 01/06/2018   Procedure: CESAREAN SECTION;  Surgeon: Eldonna Suzen Octave, MD;  Location: [facility];  Service: Obstetrics;  Laterality: N/A;   EXCISION OF SKIN TAG  11/22/2014   Procedure: EXCISION OF SKIN TAG VULVA;  Surgeon: Cynthia Loss, MD;  Location: [facility];  Service: Gynecology;;   LAPAROSCOPIC GELPORT ASSISTED MYOMECTOMY N/A 11/22/2014   Procedure: LAPAROSCOPIC GELPORT ASSISTED MYOMECTOMY WITH CHROMOTUBATION;  Surgeon: Cynthia Loss, MD;  Location: [facility];  Service: Gynecology;  Laterality: N/A;   LAPAROSCOPY N/A 11/22/2014   Procedure: LAPAROSCOPY DIAGNOSTIC;  Surgeon: Cynthia Loss, MD;  Location: [facility];  Service: Gynecology;  Laterality: N/A;   LEEP  2011   TONSILLECTOMY AND ADENOIDECTOMY  age 28   WISDOM TOOTH EXTRACTION  2006    Current Outpatient Medications  Medication Sig Dispense Refill   cetirizine  (ZYRTEC ) 10 MG tablet Take 1 tablet by mouth daily.  cholecalciferol (VITAMIN D ) 1000 units tablet Take 1,000 Units by mouth daily.     ibuprofen  (ADVIL ) 800 MG tablet TAKE 1 TABLET BY MOUTH EVERY 8 HOURS AS NEEDED 30 tablet 5   Multiple Vitamins-Minerals (PROBIOTICS + BARIATRIC MULTI PO) Take by mouth.     Omega-3 Fatty Acids (FISH OIL) 1000 MG CAPS Take 1,000 mg by mouth daily.      semaglutide -weight management (WEGOVY ) 0.5 MG/0.5ML SOAJ SQ injection Inject 0.5 mg into the skin once a week. 2 mL 0   semaglutide -weight management (WEGOVY ) 0.5 MG/0.5ML  SOAJ SQ injection Inject 0.5 mg into the skin once a week. 2 mL 0   spironolactone (ALDACTONE) 50 MG tablet Take 1 tablet by mouth daily.     tretinoin (RETIN-A) 0.025 % cream      Current Facility-Administered Medications  Medication Dose Route Frequency Provider Last Rate Last Admin   triamcinolone  acetonide (KENALOG ) 10 MG/ML injection 10 mg  10 mg Other Once Stover, Titorya, DPM        Allergies as of 02/24/2024 - Review Complete 02/24/2024  Allergen Reaction Noted   Minocycline Rash 07/12/2019    Family History  Problem Relation Age of Onset   Fibroids Mother    Breast cancer Mother    Thyroid  disease Neg Hx     Review of Systems:    Constitutional: No weight loss, fever, chills, weakness or fatigue HEENT: Eyes: No change in vision               Ears, Nose, Throat:  No change in hearing or congestion Skin: No rash or itching Cardiovascular: No chest pain, chest pressure or palpitations   Respiratory: No SOB or cough Gastrointestinal: See HPI and otherwise negative Genitourinary: No dysuria or change in urinary frequency Neurological: No headache, dizziness or syncope Musculoskeletal: No new muscle or joint pain Hematologic: No bleeding or bruising Psychiatric: No history of depression or anxiety    Physical Exam:  Vital signs: BP 112/68   Pulse 83   Ht 5' 3 (1.6 m)   Wt 223 lb (101.2 kg)   BMI 39.50 kg/m   Constitutional:   Pleasant  female appears to be in NAD, Well developed, Well nourished, alert and cooperative Eyes:   PEERL, EOMI. No icterus. Conjunctiva pink. Neck:  Supple Throat: Oral cavity and pharynx without inflammation, swelling or lesion.  Respiratory: Respirations even and unlabored. Lungs clear to auscultation bilaterally.   No wheezes, crackles, or rhonchi.  Cardiovascular: Normal S1, S2. Regular rate and rhythm. No peripheral edema, cyanosis or pallor.  Gastrointestinal:  Soft, nondistended, nontender. No rebound or guarding. Normal bowel  sounds. No appreciable masses or hepatomegaly. Rectal: external rectal skin tag noted on exam, normal rectal tone, appreciated internal hemorrhoids, non-tender, no masses, , brown stool, hemoccult N/A Chaperone Denise Anoscopy:internal hemorrhoids Msk:  Symmetrical without gross deformities. Without edema, no deformity or joint abnormality.  Neurologic:  Alert and  oriented x4;  grossly normal neurologically.  Skin:   Dry and intact without significant lesions or rashes.  RELEVANT LABS AND IMAGING: CBC    Latest Ref Rng & Units 10/14/2023    2:34 PM 02/27/2022    3:36 PM 06/26/2021    1:14 PM  CBC  WBC 4.0 - 10.5 K/uL 7.1  8.7  8.2   Hemoglobin 12.0 - 15.0 g/dL 85.6  86.1  85.9   Hematocrit 36.0 - 46.0 % 42.5  39.9  42.0   Platelets 150.0 - 400.0 K/uL 403.0  382  403      CMP     Latest Ref Rng & Units 10/14/2023    2:34 PM 11/26/2022    2:30 PM 02/27/2022    3:36 PM  CMP  Glucose 70 - 99 mg/dL 70  72  76   BUN 6 - 23 mg/dL 12  14  14    Creatinine 0.40 - 1.20 mg/dL 9.28  9.05  9.24   Sodium 135 - 145 mEq/L 137  140  138   Potassium 3.5 - 5.1 mEq/L 4.4  4.5  4.6   Chloride 96 - 112 mEq/L 102  104  102   CO2 19 - 32 mEq/L 20  28  23    Calcium 8.4 - 10.5 mg/dL 9.7  9.4  9.4   Total Protein 6.0 - 8.3 g/dL  7.4    Total Bilirubin 0.2 - 1.2 mg/dL  0.4    Alkaline Phos 39 - 117 U/L  74    AST 0 - 37 U/L  17    ALT 0 - 35 U/L  14       Lab Results  Component Value Date   TSH 1.21 09/01/2023   10/15/23 CTAP IMPRESSION: No acute abnormality in the abdomen or pelvis.   Assessment: Encounter Diagnoses  Name Primary?   RLQ abdominal pain Yes   Chronic idiopathic constipation    Rectal pain    Internal hemorrhoids    43 year old female patient that presents with right lower quadrant abdominal pain.  Recent CT scan 8/25 with no acute abnormality.  Patient reports the pain is worse if she lays on her right side.  Pain is not affected by eating or with having bowel movement.   Patient does endorse having chronic constipation and recently has had issues with possible anal fissure and/or hemorrhoids.  Upon physical examination it was noted patient had skin tag on the external rectal area as well as internal hemorrhoids.  No noted fissure on exam.  Will go ahead and treat with Anusol  suppositories at bedtime for 7 days along with following high-fiber diet.  Will order 2 view x-ray to evaluate stool burden and if constipation shown will have patient start samples of pro secretory agent Linzess 72 mcg p.o. daily. Reevaluate symptoms and if no improvement consider endoscopic procedures. Referral to colon rectal surgery for removal of skin tag.  Plan: -Abd xray 2 view -recommend high fiber diet -Anusol  1 suppository at bedtime -samples of Linzess 72mcg po daily  -referral [facility] for external skin tag  Thank you for the courtesy of this consult. Please call me with any questions or concerns.   Adrian Dinovo, FNP-C [facility] 02/24/2024, 2:51 PM  Cc: [name], NP

## 2024-02-26 ENCOUNTER — Ambulatory Visit: Payer: Self-pay | Admitting: Gastroenterology

## 2024-02-29 ENCOUNTER — Other Ambulatory Visit (HOSPITAL_COMMUNITY): Payer: Self-pay

## 2024-02-29 DIAGNOSIS — E66812 Obesity, class 2: Secondary | ICD-10-CM

## 2024-02-29 DIAGNOSIS — R7303 Prediabetes: Secondary | ICD-10-CM

## 2024-02-29 MED ORDER — WEGOVY 0.5 MG/0.5ML ~~LOC~~ SOAJ
0.5000 mg | SUBCUTANEOUS | 0 refills | Status: AC
  Filled 2024-02-29 – 2024-03-15 (×2): qty 2, 28d supply, fill #0

## 2024-03-01 ENCOUNTER — Encounter: Admitting: Primary Care

## 2024-03-11 ENCOUNTER — Other Ambulatory Visit (HOSPITAL_COMMUNITY): Payer: Self-pay

## 2024-03-15 ENCOUNTER — Other Ambulatory Visit (HOSPITAL_COMMUNITY): Payer: Self-pay

## 2024-03-16 ENCOUNTER — Encounter: Admitting: Primary Care

## 2024-03-16 ENCOUNTER — Telehealth: Payer: Self-pay

## 2024-03-16 NOTE — Telephone Encounter (Signed)
 Copied from CRM [id number]. Topic: General - Other >> Mar 15, 2024  4:40 PM Joesph B wrote: Reason for CRM: patient would like to know if there is any coupons for wegovy  that she can pick up

## 2024-03-16 NOTE — Telephone Encounter (Signed)
 Called patient let know we don't have any in office. There is  a website that you can go to to get coupon if you qualify. I have sent link in my chart. This was requested by patient. She will try and see if that helps.

## 2024-03-30 ENCOUNTER — Encounter: Admitting: Primary Care

## 2024-04-05 ENCOUNTER — Encounter: Admitting: Primary Care

## 2024-08-30 ENCOUNTER — Ambulatory Visit: Admitting: Internal Medicine
